# Patient Record
Sex: Male | Born: 1985
Health system: Southern US, Community
[De-identification: ages and names within clinical notes are randomized; demographics above are authoritative.]

## PROBLEM LIST (undated history)

## (undated) DIAGNOSIS — J45909 Unspecified asthma, uncomplicated: Secondary | ICD-10-CM

## (undated) DIAGNOSIS — J302 Other seasonal allergic rhinitis: Secondary | ICD-10-CM

## (undated) DIAGNOSIS — F419 Anxiety disorder, unspecified: Secondary | ICD-10-CM

## (undated) DIAGNOSIS — K219 Gastro-esophageal reflux disease without esophagitis: Secondary | ICD-10-CM

## (undated) DIAGNOSIS — R001 Bradycardia, unspecified: Secondary | ICD-10-CM

## (undated) HISTORY — PX: DENTAL SURGERY: SHX609

---
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
## 2007-03-15 ENCOUNTER — Emergency Department (HOSPITAL_COMMUNITY): Admission: EM | Admit: 2007-03-15 | Discharge: 2007-03-16 | Payer: Self-pay | Admitting: Emergency Medicine

## 2007-03-17 ENCOUNTER — Emergency Department (HOSPITAL_COMMUNITY): Admission: EM | Admit: 2007-03-17 | Discharge: 2007-03-18 | Payer: Self-pay | Admitting: Emergency Medicine

## 2007-03-19 ENCOUNTER — Emergency Department (HOSPITAL_COMMUNITY): Admission: EM | Admit: 2007-03-19 | Discharge: 2007-03-19 | Payer: Self-pay | Admitting: Emergency Medicine

## 2007-12-15 ENCOUNTER — Emergency Department (HOSPITAL_COMMUNITY): Admission: EM | Admit: 2007-12-15 | Discharge: 2007-12-16 | Payer: Self-pay | Admitting: Emergency Medicine

## 2008-02-09 ENCOUNTER — Emergency Department (HOSPITAL_COMMUNITY): Admission: EM | Admit: 2008-02-09 | Discharge: 2008-02-09 | Payer: Self-pay | Admitting: Emergency Medicine

## 2008-02-11 ENCOUNTER — Emergency Department (HOSPITAL_COMMUNITY): Admission: EM | Admit: 2008-02-11 | Discharge: 2008-02-11 | Payer: Self-pay | Admitting: Emergency Medicine

## 2008-02-28 ENCOUNTER — Emergency Department (HOSPITAL_COMMUNITY): Admission: EM | Admit: 2008-02-28 | Discharge: 2008-02-28 | Payer: Self-pay | Admitting: Emergency Medicine

## 2008-04-03 ENCOUNTER — Emergency Department (HOSPITAL_COMMUNITY): Admission: EM | Admit: 2008-04-03 | Discharge: 2008-04-03 | Payer: Self-pay | Admitting: Emergency Medicine

## 2008-04-05 ENCOUNTER — Emergency Department (HOSPITAL_COMMUNITY): Admission: EM | Admit: 2008-04-05 | Discharge: 2008-04-05 | Payer: Self-pay | Admitting: Emergency Medicine

## 2008-06-06 ENCOUNTER — Emergency Department (HOSPITAL_COMMUNITY): Admission: EM | Admit: 2008-06-06 | Discharge: 2008-06-06 | Payer: Self-pay | Admitting: Emergency Medicine

## 2008-06-09 ENCOUNTER — Emergency Department (HOSPITAL_COMMUNITY): Admission: EM | Admit: 2008-06-09 | Discharge: 2008-06-09 | Payer: Self-pay | Admitting: Emergency Medicine

## 2008-06-11 ENCOUNTER — Emergency Department (HOSPITAL_COMMUNITY): Admission: EM | Admit: 2008-06-11 | Discharge: 2008-06-12 | Payer: Self-pay | Admitting: Emergency Medicine

## 2008-06-11 ENCOUNTER — Emergency Department (HOSPITAL_COMMUNITY): Admission: EM | Admit: 2008-06-11 | Discharge: 2008-06-11 | Payer: Self-pay | Admitting: Emergency Medicine

## 2008-09-01 ENCOUNTER — Emergency Department (HOSPITAL_COMMUNITY): Admission: EM | Admit: 2008-09-01 | Discharge: 2008-09-02 | Payer: Self-pay | Admitting: Emergency Medicine

## 2009-03-30 ENCOUNTER — Emergency Department (HOSPITAL_COMMUNITY): Admission: EM | Admit: 2009-03-30 | Discharge: 2009-03-30 | Payer: Self-pay | Admitting: Emergency Medicine

## 2009-04-08 ENCOUNTER — Emergency Department (HOSPITAL_COMMUNITY): Admission: EM | Admit: 2009-04-08 | Discharge: 2009-04-08 | Payer: Self-pay | Admitting: Emergency Medicine

## 2009-06-26 ENCOUNTER — Emergency Department (HOSPITAL_COMMUNITY): Admission: EM | Admit: 2009-06-26 | Discharge: 2009-06-26 | Payer: Self-pay | Admitting: Emergency Medicine

## 2009-06-27 ENCOUNTER — Emergency Department (HOSPITAL_COMMUNITY): Admission: EM | Admit: 2009-06-27 | Discharge: 2009-06-27 | Payer: Self-pay | Admitting: Emergency Medicine

## 2009-08-14 ENCOUNTER — Emergency Department (HOSPITAL_COMMUNITY): Admission: EM | Admit: 2009-08-14 | Discharge: 2009-08-14 | Payer: Self-pay | Admitting: Emergency Medicine

## 2009-10-31 ENCOUNTER — Emergency Department (HOSPITAL_COMMUNITY): Admission: EM | Admit: 2009-10-31 | Discharge: 2009-10-31 | Payer: Self-pay | Admitting: Emergency Medicine

## 2010-09-11 ENCOUNTER — Emergency Department (HOSPITAL_COMMUNITY)
Admission: EM | Admit: 2010-09-11 | Discharge: 2010-09-12 | Discharge status: Against Medical Advice | Payer: Medicaid Other | Attending: Emergency Medicine | Admitting: Emergency Medicine

## 2010-09-11 DIAGNOSIS — R0602 Shortness of breath: Secondary | ICD-10-CM | POA: Insufficient documentation

## 2010-10-13 LAB — COMPREHENSIVE METABOLIC PANEL
Alkaline Phosphatase: 68 U/L (ref 39–117)
BUN: 11 mg/dL (ref 6–23)
Chloride: 104 mEq/L (ref 96–112)
GFR calc non Af Amer: >60 mL/min (ref >60)
Total Bilirubin: 0.8 mg/dL (ref 0.3–1.2)

## 2010-10-13 LAB — CBC
Hemoglobin: 16.8 g/dL (ref 13.0–17.0)
MCHC: 33.3 g/dL (ref 30.0–36.0)
MCV: 87.3 fL (ref 78.0–100.0)
Platelets: 186 10*3/uL (ref 150–400)
RDW: 14 % (ref 11.5–15.5)

## 2010-10-13 LAB — DIFFERENTIAL
Eosinophils Absolute: 0.1 10*3/uL (ref 0.0–0.7)
Eosinophils Relative: 2 % (ref 0–5)
Lymphs Abs: 1 10*3/uL (ref 0.7–4.0)
Monocytes Absolute: 0.5 10*3/uL (ref 0.1–1.0)
Neutro Abs: 3.5 10*3/uL (ref 1.7–7.7)
Neutrophils Relative %: 69 % (ref 43–77)

## 2010-10-13 LAB — LIPASE, BLOOD: Lipase: 17 U/L (ref 11–59)

## 2010-10-29 ENCOUNTER — Emergency Department (HOSPITAL_COMMUNITY)
Admission: EM | Admit: 2010-10-29 | Discharge: 2010-10-29 | Disposition: A | Discharge status: Home / Self Care | Payer: Medicaid Other | Attending: Emergency Medicine | Admitting: Emergency Medicine

## 2010-10-29 DIAGNOSIS — F411 Generalized anxiety disorder: Secondary | ICD-10-CM | POA: Insufficient documentation

## 2010-10-29 DIAGNOSIS — J45909 Unspecified asthma, uncomplicated: Secondary | ICD-10-CM | POA: Insufficient documentation

## 2010-10-29 DIAGNOSIS — F410 Panic disorder [episodic paroxysmal anxiety] without agoraphobia: Secondary | ICD-10-CM | POA: Insufficient documentation

## 2010-10-31 LAB — RAPID STREP SCREEN (MED CTR MEBANE ONLY): Streptococcus, Group A Screen (Direct): NEGATIVE

## 2010-11-01 ENCOUNTER — Emergency Department (HOSPITAL_COMMUNITY)
Admission: EM | Admit: 2010-11-01 | Discharge: 2010-11-01 | Disposition: A | Discharge status: Home / Self Care | Payer: Medicaid Other | Attending: Emergency Medicine | Admitting: Emergency Medicine

## 2010-11-01 DIAGNOSIS — J45909 Unspecified asthma, uncomplicated: Secondary | ICD-10-CM | POA: Insufficient documentation

## 2010-11-01 DIAGNOSIS — R112 Nausea with vomiting, unspecified: Secondary | ICD-10-CM | POA: Insufficient documentation

## 2010-11-01 DIAGNOSIS — R10819 Abdominal tenderness, unspecified site: Secondary | ICD-10-CM | POA: Insufficient documentation

## 2010-11-01 DIAGNOSIS — K2920 Alcoholic gastritis without bleeding: Secondary | ICD-10-CM | POA: Insufficient documentation

## 2010-11-01 DIAGNOSIS — Z79899 Other long term (current) drug therapy: Secondary | ICD-10-CM | POA: Insufficient documentation

## 2010-11-01 DIAGNOSIS — R109 Unspecified abdominal pain: Secondary | ICD-10-CM | POA: Insufficient documentation

## 2010-11-01 DIAGNOSIS — F17200 Nicotine dependence, unspecified, uncomplicated: Secondary | ICD-10-CM | POA: Insufficient documentation

## 2010-11-01 LAB — COMPREHENSIVE METABOLIC PANEL
Albumin: 4.4 g/dL (ref 3.5–5.2)
Alkaline Phosphatase: 62 U/L (ref 39–117)
BUN: 11 mg/dL (ref 6–23)
Creatinine, Ser: 0.96 mg/dL (ref 0.4–1.5)
Glucose, Bld: 89 mg/dL (ref 70–99)
Total Bilirubin: 0.5 mg/dL (ref 0.3–1.2)
Total Protein: 7 g/dL (ref 6.0–8.3)

## 2010-11-01 LAB — CBC
Hemoglobin: 15.5 g/dL (ref 13.0–17.0)
MCH: 29.1 pg (ref 26.0–34.0)
MCHC: 35.7 g/dL (ref 30.0–36.0)
RBC: 5.32 MIL/uL (ref 4.22–5.81)
RDW: 13.3 % (ref 11.5–15.5)

## 2010-11-01 LAB — DIFFERENTIAL
Eosinophils Relative: 5 % (ref 0–5)
Lymphocytes Relative: 37 % (ref 12–46)
Monocytes Absolute: 0.6 10*3/uL (ref 0.1–1.0)
Monocytes Relative: 8 % (ref 3–12)
Neutro Abs: 3.6 10*3/uL (ref 1.7–7.7)
Neutrophils Relative %: 50 % (ref 43–77)

## 2010-11-01 LAB — LIPASE, BLOOD: Lipase: 20 U/L (ref 11–59)

## 2010-11-01 LAB — OCCULT BLOOD, POC DEVICE: Fecal Occult Bld: NEGATIVE

## 2010-11-30 ENCOUNTER — Emergency Department (HOSPITAL_COMMUNITY)
Admission: EM | Admit: 2010-11-30 | Discharge: 2010-11-30 | Disposition: A | Discharge status: Home / Self Care | Payer: Medicaid Other | Attending: Emergency Medicine | Admitting: Emergency Medicine

## 2010-11-30 DIAGNOSIS — J45909 Unspecified asthma, uncomplicated: Secondary | ICD-10-CM | POA: Insufficient documentation

## 2010-11-30 DIAGNOSIS — R209 Unspecified disturbances of skin sensation: Secondary | ICD-10-CM | POA: Insufficient documentation

## 2010-11-30 DIAGNOSIS — L299 Pruritus, unspecified: Secondary | ICD-10-CM | POA: Insufficient documentation

## 2010-11-30 DIAGNOSIS — F17200 Nicotine dependence, unspecified, uncomplicated: Secondary | ICD-10-CM | POA: Insufficient documentation

## 2010-12-03 ENCOUNTER — Emergency Department (HOSPITAL_COMMUNITY)
Admission: EM | Admit: 2010-12-03 | Discharge: 2010-12-03 | Disposition: A | Discharge status: Home / Self Care | Payer: Medicaid Other | Attending: Emergency Medicine | Admitting: Emergency Medicine

## 2010-12-03 DIAGNOSIS — K089 Disorder of teeth and supporting structures, unspecified: Secondary | ICD-10-CM | POA: Insufficient documentation

## 2010-12-03 DIAGNOSIS — M2550 Pain in unspecified joint: Secondary | ICD-10-CM | POA: Insufficient documentation

## 2010-12-03 DIAGNOSIS — J45909 Unspecified asthma, uncomplicated: Secondary | ICD-10-CM | POA: Insufficient documentation

## 2010-12-12 NOTE — Consult Note (Signed)
NAME:  Kenneth Lane, Kenneth Lane NO.:  [id number]   MEDICAL RECORD NO.:  [id number]                   PATIENT TYPE:  INP   LOCATION:                                       FACILITY:  [facility]   PHYSICIAN:  Genene Churn. Love, M.D.                 DATE OF BIRTH:  1985-10-11   DATE OF CONSULTATION:  DATE OF DISCHARGE:                                   CONSULTATION   This 25 year old left-handed black male is seen on the teaching service for  evaluation of spells.   HISTORY OF PRESENT ILLNESS:  According to his mom, the patient was a 1 pound  7 ounce product of a 25-month gestation and was born at [facility]  15 years ago.  He walked and talked at about 1-1/2 years.  He was held back  in the first grade by his mother but since that time has done well in school  and has finished the 9th grade and is getting ready to go into the 10th  grade.  Yesterday he was staying at a friend's house overnight and he  complained of chest pain.  He had an episode of nausea and vomiting, was  given an icicle, laid down, become unresponsive.  He then had an episode of  unresponsiveness while the EMT service picked him up on the way to [facility] and then three others witnessed in the emergency room by Dr.  Ignacia Palma who treated the patient with IV Ativan.  This episode was  characterized by trembling movements without associated bowel or bladder  incontinence.  A CT scan of the brain has been normal but a CPK has been  elevated at 336.  There is a positive family history of seizures in the  patient's sister has a history of seizures.   PHYSICAL EXAMINATION:  GENERAL:  Well-developed, pleasant male in no acute  distress.  VITAL SIGNS:  Blood pressure right arm 100/60, left arm 100/60.  He was  afebrile.  HEART:  There is a right carotid and right supraclavicular bruit heard.  He  also had a heart murmur.  NEUROLOGIC:  He was alert and cooperative, would follow commands.   His  visual fields were full.  Disks were flat.  Extraocular movements were full  and corneals were present.  There was no 7th nerve palsy.  Tongue was  midline.  The uvula was midline and gags were present.  Sternocleidomastoid  and trapezius testing were normal.  Motor examination revealed good strength  in the upper and lower extremities.  Sensory examination was intact to pin  prick, light touch, __________ and vibration testing.  Deep tendon reflexes  were 2+ and plantar responses were downgoing.  Gait was not evaluated.   IMPRESSION:  Possible seizures (code 345.10).   PLAN:  Obtain an EEG and consider further evaluation after the above.   ADDENDUM:  CBC and comprehensive metabolic panel and drug screen to date  have been negative.                                               Genene Churn. Sandria Manly, M.D.    JML/MEDQ  D:  03/18/2004  T:  03/18/2004  Job:  [id number]

## 2010-12-12 NOTE — Discharge Summary (Signed)
NAME:  Kenneth Lane, Kenneth Lane NO.:  [id number]   MEDICAL RECORD NO.:  [id number]                   PATIENT TYPE:  INP   LOCATION:  6118                                 FACILITY:  [facility]   PHYSICIAN:  Asher Muir, M.D.                      DATE OF BIRTH:  12/25/1985   DATE OF ADMISSION:  03/17/2004  DATE OF DISCHARGE:  03/18/2004                                 DISCHARGE SUMMARY   REASON FOR HOSPITALIZATION:  Questionable seizures.   SIGNIFICANT FINDINGS:  [age]-year-old male admitted for a previous episode  of shaking, concern for seizure, true witnessed event in hospital during  which the patient could respond to questions and voluntarily move.  Neurological consult was done with Dr. Deanna Artis. Hickling and discharged  patient with a diagnosis of pseudoseizure.  Also, history of syncope with  prodromal dizziness.  EKGs performed were normal repeatedly.  The patient  was seen by cardiology and all results were within normal limits.  Patient  was treated Zantac for GERD-like symptoms.   OPERATIONS AND PROCEDURES:  1. Echocardiogram:  Normal.  2. EKG:  Normal.  3. Electroencephalogram:  Pending results.   FINAL DIAGNOSIS:  Pseudoseizure.   DISCHARGE MEDICATIONS AND INSTRUCTIONS:  Zantac 150 mg p.o. b.i.d.   PENDING RESULTS OR ISSUES TO BE FOLLOWED:  Electroencephalogram.   FOLLOWUP:  Follow up with Dr. Heather Roberts p.r.n.   DISCHARGE CONDITION:  Good.      Sharin Grave, MD                     Asher Muir, M.D.    AM/MEDQ  D:  03/18/2004  T:  03/19/2004  Job:  [id number]   cc:   Heather Roberts, M.D.   Deanna Artis. Sharene Skeans, M.D.  [address]  [address]  [place]  [place] [address]  Fax: [phone]   Elsie Stain, M.D.  [facility]  [address]  [place] [address]  Fax: [phone]

## 2010-12-12 NOTE — Procedures (Signed)
PROCEDURE:  Electroencephalogram.   PHYSICIAN:  Deanna Artis. Sharene Skeans, M.D.   INDICATIONS FOR PROCEDURE:  The patient is a nearly 25 year old young man  with a history of seizure-like behavior that began after he swallowed  something very hot, followed by something very cold.  He had a syncopal  episode and since that time has been having jerking movements that have been  interpreted as seizures, but appear on close evaluation to be pseudo-  seizures.  This study is being done to look for the presence of epilepsy.   DESCRIPTION OF PROCEDURE:  The tracing is carried out on a 32-channel  digital Cadwell recorder, reformatted into 16-channel montages, with one  devoted to an electrocardiogram.  The patient was awake and asleep during  the recording.  The International 10/20 System lead placement was used.  The  patient has received Ativan.   FINDINGS:  Dominant frequency is an 8-9 Hz 25-50 mV activity that is well-  modulated and regulated, and attenuates partially with eye opening.  The  background activity is a mixture of low-voltage alpha and beta range  activity with occasional upper theta range component superimposed, and also  occasional polymorphic delta range activity seen in the posterior regions,  which represents posterior slowing of youth.  The patient becomes drowsy and  drifts into natural sleep with the appearance of vertex sharp waves that  extend from the frontal vertex to the parietal vertex, and are later  associated with symmetric and synchronous sleep spindles.  The background is  a rhythmic and polymorphic delta range activity that is generalized.  There  was no focal slowing in the background.  There was no inter-ictal  epileptiform activity in the form of spikes or sharp waves.  Intermittent  photic stimulation was carried out and induces sustained driving response  from 3 Hz to 9 Hz.  Hyperventilation was carried out, with a return of the  waking rhythm and mild  slowing into the theta range.  There was no focal  slowing.  There was no inter-ictal epileptiform activity in the form of  spikes or sharp waves.  The electrocardiogram showed a sinus arrhythmia with a ventricular response  of 78 beats per minute.   ICD-9 CLASSIFICATION:  Normal record in the waking state and in natural  sleep.    WILLIAM H. Sharene Skeans, M.D.   ZOX:WRUE  D:  03/18/2004 19:04:29  T:  03/19/2004 11:22:14  Job #:  [id number]   cc:   Asher Muir, M.D.  [address]  [place] [address]  Fax: [phone]

## 2011-01-24 ENCOUNTER — Emergency Department (HOSPITAL_COMMUNITY)
Admission: EM | Admit: 2011-01-24 | Discharge: 2011-01-25 | Disposition: A | Discharge status: Home / Self Care | Payer: Medicaid Other | Attending: Emergency Medicine | Admitting: Emergency Medicine

## 2011-01-24 DIAGNOSIS — R197 Diarrhea, unspecified: Secondary | ICD-10-CM | POA: Insufficient documentation

## 2011-01-24 DIAGNOSIS — J45909 Unspecified asthma, uncomplicated: Secondary | ICD-10-CM | POA: Insufficient documentation

## 2011-01-24 DIAGNOSIS — R1013 Epigastric pain: Secondary | ICD-10-CM | POA: Insufficient documentation

## 2011-01-24 DIAGNOSIS — K2970 Gastritis, unspecified, without bleeding: Secondary | ICD-10-CM | POA: Insufficient documentation

## 2011-01-24 DIAGNOSIS — F17200 Nicotine dependence, unspecified, uncomplicated: Secondary | ICD-10-CM | POA: Insufficient documentation

## 2011-01-25 LAB — DIFFERENTIAL
Basophils Absolute: 0 10*3/uL (ref 0.0–0.1)
Basophils Relative: 1 % (ref 0–1)
Eosinophils Absolute: 0.3 10*3/uL (ref 0.0–0.7)
Monocytes Relative: 9 % (ref 3–12)
Neutro Abs: 2.2 10*3/uL (ref 1.7–7.7)
Neutrophils Relative %: 41 % — ABNORMAL LOW (ref 43–77)

## 2011-01-25 LAB — URINALYSIS, ROUTINE W REFLEX MICROSCOPIC
Hgb urine dipstick: NEGATIVE
Leukocytes, UA: NEGATIVE
Protein, ur: NEGATIVE mg/dL
Specific Gravity, Urine: 1.02 (ref 1.005–1.030)
Urobilinogen, UA: 1 mg/dL (ref 0.0–1.0)

## 2011-01-25 LAB — POCT I-STAT, CHEM 8
BUN: 12 mg/dL (ref 6–23)
Creatinine, Ser: 0.8 mg/dL (ref 0.50–1.35)
Hemoglobin: 16.7 g/dL (ref 13.0–17.0)
Potassium: 3.6 mEq/L (ref 3.5–5.1)
Sodium: 140 mEq/L (ref 135–145)
TCO2: 23 mmol/L (ref 0–100)

## 2011-01-25 LAB — HEPATIC FUNCTION PANEL
ALT: 8 U/L (ref 0–53)
AST: 14 U/L (ref 0–37)
Albumin: 4.6 g/dL (ref 3.5–5.2)
Alkaline Phosphatase: 72 U/L (ref 39–117)
Indirect Bilirubin: 0.2 mg/dL — ABNORMAL LOW (ref 0.3–0.9)
Total Protein: 7.3 g/dL (ref 6.0–8.3)

## 2011-01-25 LAB — CBC
Hemoglobin: 15.7 g/dL (ref 13.0–17.0)
Platelets: 202 10*3/uL (ref 150–400)
RBC: 5.35 MIL/uL (ref 4.22–5.81)
WBC: 5.5 10*3/uL (ref 4.0–10.5)

## 2011-01-26 ENCOUNTER — Emergency Department (HOSPITAL_COMMUNITY)
Admission: EM | Admit: 2011-01-26 | Discharge: 2011-01-27 | Disposition: A | Discharge status: Home / Self Care | Payer: Medicaid Other | Attending: Emergency Medicine | Admitting: Emergency Medicine

## 2011-01-26 DIAGNOSIS — Z91199 Patient's noncompliance with other medical treatment and regimen due to unspecified reason: Secondary | ICD-10-CM | POA: Insufficient documentation

## 2011-01-26 DIAGNOSIS — R10819 Abdominal tenderness, unspecified site: Secondary | ICD-10-CM | POA: Insufficient documentation

## 2011-01-26 DIAGNOSIS — F17200 Nicotine dependence, unspecified, uncomplicated: Secondary | ICD-10-CM | POA: Insufficient documentation

## 2011-01-26 DIAGNOSIS — R109 Unspecified abdominal pain: Secondary | ICD-10-CM | POA: Insufficient documentation

## 2011-01-26 DIAGNOSIS — R1110 Vomiting, unspecified: Secondary | ICD-10-CM | POA: Insufficient documentation

## 2011-01-26 DIAGNOSIS — K2970 Gastritis, unspecified, without bleeding: Secondary | ICD-10-CM | POA: Insufficient documentation

## 2011-01-26 DIAGNOSIS — J45909 Unspecified asthma, uncomplicated: Secondary | ICD-10-CM | POA: Insufficient documentation

## 2011-01-26 DIAGNOSIS — G8929 Other chronic pain: Secondary | ICD-10-CM | POA: Insufficient documentation

## 2011-01-26 DIAGNOSIS — Z9119 Patient's noncompliance with other medical treatment and regimen: Secondary | ICD-10-CM | POA: Insufficient documentation

## 2011-01-26 LAB — URINE CULTURE
Colony Count: NO GROWTH
Culture  Setup Time: 201207011149
Culture: NO GROWTH

## 2011-02-01 ENCOUNTER — Emergency Department (HOSPITAL_COMMUNITY)
Admission: EM | Admit: 2011-02-01 | Discharge: 2011-02-01 | Disposition: A | Discharge status: Home / Self Care | Payer: Medicaid Other | Attending: Emergency Medicine | Admitting: Emergency Medicine

## 2011-02-01 DIAGNOSIS — F411 Generalized anxiety disorder: Secondary | ICD-10-CM | POA: Insufficient documentation

## 2011-02-01 DIAGNOSIS — R0602 Shortness of breath: Secondary | ICD-10-CM | POA: Insufficient documentation

## 2011-02-01 DIAGNOSIS — Z8711 Personal history of peptic ulcer disease: Secondary | ICD-10-CM | POA: Insufficient documentation

## 2011-02-01 DIAGNOSIS — F17200 Nicotine dependence, unspecified, uncomplicated: Secondary | ICD-10-CM | POA: Insufficient documentation

## 2011-02-01 DIAGNOSIS — Z79899 Other long term (current) drug therapy: Secondary | ICD-10-CM | POA: Insufficient documentation

## 2011-02-01 DIAGNOSIS — R002 Palpitations: Secondary | ICD-10-CM | POA: Insufficient documentation

## 2011-02-01 DIAGNOSIS — R42 Dizziness and giddiness: Secondary | ICD-10-CM | POA: Insufficient documentation

## 2011-02-01 DIAGNOSIS — E876 Hypokalemia: Secondary | ICD-10-CM | POA: Insufficient documentation

## 2011-02-01 LAB — POCT I-STAT, CHEM 8
BUN: 13 mg/dL (ref 6–23)
Calcium, Ion: 1.16 mmol/L (ref 1.12–1.32)
Hemoglobin: 15 g/dL (ref 13.0–17.0)
Sodium: 140 mEq/L (ref 135–145)
TCO2: 22 mmol/L (ref 0–100)

## 2011-02-19 ENCOUNTER — Emergency Department (HOSPITAL_COMMUNITY)
Admission: EM | Admit: 2011-02-19 | Discharge: 2011-02-19 | Disposition: A | Discharge status: Home / Self Care | Payer: Medicaid Other | Attending: Emergency Medicine | Admitting: Emergency Medicine

## 2011-02-19 DIAGNOSIS — F411 Generalized anxiety disorder: Secondary | ICD-10-CM | POA: Insufficient documentation

## 2011-02-19 DIAGNOSIS — R109 Unspecified abdominal pain: Secondary | ICD-10-CM | POA: Insufficient documentation

## 2011-02-19 DIAGNOSIS — J45909 Unspecified asthma, uncomplicated: Secondary | ICD-10-CM | POA: Insufficient documentation

## 2011-02-19 DIAGNOSIS — G8929 Other chronic pain: Secondary | ICD-10-CM | POA: Insufficient documentation

## 2011-02-19 DIAGNOSIS — F17200 Nicotine dependence, unspecified, uncomplicated: Secondary | ICD-10-CM | POA: Insufficient documentation

## 2011-02-19 LAB — CBC
MCH: 28.2 pg (ref 26.0–34.0)
MCV: 82.1 fL (ref 78.0–100.0)
Platelets: 184 10*3/uL (ref 150–400)
RDW: 13.4 % (ref 11.5–15.5)

## 2011-02-19 LAB — DIFFERENTIAL
Basophils Relative: 1 % (ref 0–1)
Eosinophils Absolute: 0.3 10*3/uL (ref 0.0–0.7)
Eosinophils Relative: 7 % — ABNORMAL HIGH (ref 0–5)
Lymphs Abs: 1.5 10*3/uL (ref 0.7–4.0)
Monocytes Relative: 10 % (ref 3–12)

## 2011-02-19 LAB — COMPREHENSIVE METABOLIC PANEL
AST: 17 U/L (ref 0–37)
Albumin: 4.2 g/dL (ref 3.5–5.2)
Calcium: 9.8 mg/dL (ref 8.4–10.5)
Creatinine, Ser: 0.77 mg/dL (ref 0.50–1.35)
Total Protein: 7.1 g/dL (ref 6.0–8.3)

## 2011-02-19 LAB — URINALYSIS, ROUTINE W REFLEX MICROSCOPIC
Glucose, UA: NEGATIVE mg/dL
Hgb urine dipstick: NEGATIVE
Ketones, ur: NEGATIVE mg/dL
Leukocytes, UA: NEGATIVE
Protein, ur: NEGATIVE mg/dL
Urobilinogen, UA: 0.2 mg/dL (ref 0.0–1.0)

## 2011-02-28 ENCOUNTER — Emergency Department (HOSPITAL_COMMUNITY)
Admission: EM | Admit: 2011-02-28 | Discharge: 2011-02-28 | Disposition: A | Discharge status: Home / Self Care | Payer: Medicaid Other | Attending: Emergency Medicine | Admitting: Emergency Medicine

## 2011-02-28 ENCOUNTER — Emergency Department (HOSPITAL_COMMUNITY): Payer: Medicaid Other

## 2011-02-28 DIAGNOSIS — F29 Unspecified psychosis not due to a substance or known physiological condition: Secondary | ICD-10-CM | POA: Insufficient documentation

## 2011-02-28 DIAGNOSIS — R0609 Other forms of dyspnea: Secondary | ICD-10-CM | POA: Insufficient documentation

## 2011-02-28 DIAGNOSIS — R0989 Other specified symptoms and signs involving the circulatory and respiratory systems: Secondary | ICD-10-CM | POA: Insufficient documentation

## 2011-02-28 LAB — CBC
Hemoglobin: 14.9 g/dL (ref 13.0–17.0)
MCH: 29.4 pg (ref 26.0–34.0)
MCV: 81.4 fL (ref 78.0–100.0)
RBC: 5.06 MIL/uL (ref 4.22–5.81)

## 2011-02-28 LAB — ETHANOL: Alcohol, Ethyl (B): <11 mg/dL (ref 0–11)

## 2011-02-28 LAB — DIFFERENTIAL
Basophils Relative: 1 % (ref 0–1)
Lymphocytes Relative: 37 % (ref 12–46)
Lymphs Abs: 2.3 10*3/uL (ref 0.7–4.0)
Monocytes Relative: 8 % (ref 3–12)
Neutro Abs: 3.2 10*3/uL (ref 1.7–7.7)
Neutrophils Relative %: 51 % (ref 43–77)

## 2011-02-28 LAB — BASIC METABOLIC PANEL
CO2: 26 mEq/L (ref 19–32)
Chloride: 103 mEq/L (ref 96–112)
Creatinine, Ser: 0.83 mg/dL (ref 0.50–1.35)

## 2011-03-06 ENCOUNTER — Emergency Department (HOSPITAL_COMMUNITY)
Admission: EM | Admit: 2011-03-06 | Discharge: 2011-03-06 | Disposition: A | Discharge status: Home / Self Care | Payer: Medicaid Other | Attending: Emergency Medicine | Admitting: Emergency Medicine

## 2011-03-06 DIAGNOSIS — L2989 Other pruritus: Secondary | ICD-10-CM | POA: Insufficient documentation

## 2011-03-06 DIAGNOSIS — R21 Rash and other nonspecific skin eruption: Secondary | ICD-10-CM | POA: Insufficient documentation

## 2011-03-06 DIAGNOSIS — L298 Other pruritus: Secondary | ICD-10-CM | POA: Insufficient documentation

## 2011-04-16 ENCOUNTER — Emergency Department (HOSPITAL_COMMUNITY)
Admission: EM | Admit: 2011-04-16 | Discharge: 2011-04-17 | Disposition: A | Discharge status: Home / Self Care | Payer: Medicaid Other | Attending: Emergency Medicine | Admitting: Emergency Medicine

## 2011-04-16 ENCOUNTER — Emergency Department (HOSPITAL_COMMUNITY): Payer: Medicaid Other

## 2011-04-16 DIAGNOSIS — F411 Generalized anxiety disorder: Secondary | ICD-10-CM | POA: Insufficient documentation

## 2011-04-16 DIAGNOSIS — F329 Major depressive disorder, single episode, unspecified: Secondary | ICD-10-CM | POA: Insufficient documentation

## 2011-04-16 DIAGNOSIS — R059 Cough, unspecified: Secondary | ICD-10-CM | POA: Insufficient documentation

## 2011-04-16 DIAGNOSIS — R072 Precordial pain: Secondary | ICD-10-CM | POA: Insufficient documentation

## 2011-04-16 DIAGNOSIS — F3289 Other specified depressive episodes: Secondary | ICD-10-CM | POA: Insufficient documentation

## 2011-04-16 DIAGNOSIS — R05 Cough: Secondary | ICD-10-CM | POA: Insufficient documentation

## 2011-04-16 DIAGNOSIS — R10816 Epigastric abdominal tenderness: Secondary | ICD-10-CM | POA: Insufficient documentation

## 2011-04-16 DIAGNOSIS — K2970 Gastritis, unspecified, without bleeding: Secondary | ICD-10-CM | POA: Insufficient documentation

## 2011-04-16 DIAGNOSIS — R112 Nausea with vomiting, unspecified: Secondary | ICD-10-CM | POA: Insufficient documentation

## 2011-04-17 LAB — OCCULT BLOOD, POC DEVICE: Fecal Occult Bld: NEGATIVE

## 2011-04-17 LAB — POCT I-STAT, CHEM 8
BUN: 12 mg/dL (ref 6–23)
Creatinine, Ser: 1 mg/dL (ref 0.50–1.35)
Sodium: 141 mEq/L (ref 135–145)
TCO2: 25 mmol/L (ref 0–100)

## 2011-04-20 ENCOUNTER — Emergency Department (HOSPITAL_COMMUNITY)
Admission: EM | Admit: 2011-04-20 | Discharge: 2011-04-21 | Disposition: A | Discharge status: Home / Self Care | Payer: Medicaid Other | Attending: Emergency Medicine | Admitting: Emergency Medicine

## 2011-04-20 DIAGNOSIS — Z79899 Other long term (current) drug therapy: Secondary | ICD-10-CM | POA: Insufficient documentation

## 2011-04-20 DIAGNOSIS — R002 Palpitations: Secondary | ICD-10-CM | POA: Insufficient documentation

## 2011-04-24 LAB — URINALYSIS, ROUTINE W REFLEX MICROSCOPIC
Glucose, UA: NEGATIVE
Protein, ur: NEGATIVE
Specific Gravity, Urine: 1.011
Urobilinogen, UA: 0.2

## 2011-04-24 LAB — HEPATIC FUNCTION PANEL
ALT: 13
AST: 21
Albumin: 4.5
Alkaline Phosphatase: 95
Total Protein: 6.9

## 2011-04-24 LAB — DIFFERENTIAL
Eosinophils Absolute: 0.4
Lymphocytes Relative: 46
Lymphs Abs: 2.5
Monocytes Relative: 9
Neutro Abs: 2
Neutrophils Relative %: 38 — ABNORMAL LOW

## 2011-04-24 LAB — CBC
Platelets: 230
RBC: 5.64
WBC: 5.4

## 2011-04-24 LAB — COMPREHENSIVE METABOLIC PANEL
AST: 23
Albumin: 4.5
Calcium: 9.9
Creatinine, Ser: 0.89
GFR calc Af Amer: >60

## 2011-04-24 LAB — SAMPLE TO BLOOD BANK

## 2011-04-24 LAB — POCT I-STAT, CHEM 8
BUN: 10
Calcium, Ion: 1.23
Chloride: 104
Creatinine, Ser: 1.3

## 2011-04-24 LAB — LIPASE, BLOOD: Lipase: 17

## 2011-04-29 LAB — CBC
HCT: 44.6
Hemoglobin: 14.6
MCV: 87.6
RBC: 5.09
WBC: 8.3

## 2011-04-29 LAB — DIFFERENTIAL
Eosinophils Absolute: 0.1
Eosinophils Relative: 1
Lymphs Abs: 1.8
Monocytes Absolute: 0.8
Monocytes Relative: 9

## 2011-05-08 LAB — DIFFERENTIAL
Basophils Absolute: 0
Basophils Relative: 0
Basophils Relative: 1
Eosinophils Absolute: 0.6
Eosinophils Relative: 4
Monocytes Absolute: 0.5
Monocytes Relative: 7
Neutrophils Relative %: 35 — ABNORMAL LOW

## 2011-05-08 LAB — URINALYSIS, ROUTINE W REFLEX MICROSCOPIC
Hgb urine dipstick: NEGATIVE
Ketones, ur: NEGATIVE
Leukocytes, UA: NEGATIVE
Nitrite: NEGATIVE
Nitrite: NEGATIVE
Protein, ur: NEGATIVE
Specific Gravity, Urine: 1.019
Urobilinogen, UA: 1
pH: 7.5

## 2011-05-08 LAB — CBC
MCHC: 34.4
MCV: 84.5
Platelets: 219
Platelets: 229
RDW: 13.1
WBC: 6.8

## 2011-05-08 LAB — I-STAT 8, (EC8 V) (CONVERTED LAB)
BUN: 9
Bicarbonate: 26.1 — ABNORMAL HIGH
Glucose, Bld: 108 — ABNORMAL HIGH
Sodium: 139
pCO2, Ven: 47.2

## 2011-05-08 LAB — URINE MICROSCOPIC-ADD ON

## 2011-05-08 LAB — HEPATIC FUNCTION PANEL
ALT: 14
Indirect Bilirubin: 0.6
Total Protein: 7.6

## 2011-05-08 LAB — COMPREHENSIVE METABOLIC PANEL
AST: 27
Albumin: 4.5
Alkaline Phosphatase: 98
Chloride: 100
GFR calc Af Amer: >60
Potassium: 4.7
Sodium: 134 — ABNORMAL LOW
Total Bilirubin: 1.1

## 2011-05-08 LAB — BASIC METABOLIC PANEL
BUN: 12
CO2: 30
Chloride: 102
Creatinine, Ser: 0.98
Glucose, Bld: 104 — ABNORMAL HIGH

## 2011-05-08 LAB — AMYLASE: Amylase: 131

## 2011-05-08 LAB — ETHANOL: Alcohol, Ethyl (B): <5

## 2011-05-08 LAB — LIPASE, BLOOD: Lipase: 12

## 2011-05-27 ENCOUNTER — Emergency Department (HOSPITAL_COMMUNITY)
Admission: EM | Admit: 2011-05-27 | Discharge: 2011-05-27 | Disposition: A | Discharge status: Home / Self Care | Payer: Medicaid Other | Attending: Emergency Medicine | Admitting: Emergency Medicine

## 2011-05-27 ENCOUNTER — Emergency Department (HOSPITAL_COMMUNITY): Payer: Medicaid Other

## 2011-05-27 DIAGNOSIS — R042 Hemoptysis: Secondary | ICD-10-CM | POA: Insufficient documentation

## 2011-05-27 DIAGNOSIS — K259 Gastric ulcer, unspecified as acute or chronic, without hemorrhage or perforation: Secondary | ICD-10-CM | POA: Insufficient documentation

## 2011-05-27 LAB — POCT I-STAT, CHEM 8
Hemoglobin: 17.7 g/dL — ABNORMAL HIGH (ref 13.0–17.0)
Sodium: 139 mEq/L (ref 135–145)
TCO2: 26 mmol/L (ref 0–100)

## 2011-05-27 LAB — OCCULT BLOOD, POC DEVICE: Fecal Occult Bld: NEGATIVE

## 2011-06-07 DIAGNOSIS — M25579 Pain in unspecified ankle and joints of unspecified foot: Secondary | ICD-10-CM | POA: Insufficient documentation

## 2011-06-07 DIAGNOSIS — K2990 Gastroduodenitis, unspecified, without bleeding: Secondary | ICD-10-CM | POA: Insufficient documentation

## 2011-06-07 DIAGNOSIS — R1013 Epigastric pain: Secondary | ICD-10-CM | POA: Insufficient documentation

## 2011-06-07 DIAGNOSIS — R110 Nausea: Secondary | ICD-10-CM | POA: Insufficient documentation

## 2011-06-07 DIAGNOSIS — K2970 Gastritis, unspecified, without bleeding: Secondary | ICD-10-CM | POA: Insufficient documentation

## 2011-06-07 DIAGNOSIS — Z79899 Other long term (current) drug therapy: Secondary | ICD-10-CM | POA: Insufficient documentation

## 2011-06-08 ENCOUNTER — Emergency Department (HOSPITAL_COMMUNITY)
Admission: EM | Admit: 2011-06-08 | Discharge: 2011-06-08 | Disposition: A | Discharge status: Home / Self Care | Payer: Medicaid Other | Attending: Emergency Medicine | Admitting: Emergency Medicine

## 2011-06-08 ENCOUNTER — Emergency Department (HOSPITAL_COMMUNITY)
Admission: EM | Admit: 2011-06-08 | Discharge: 2011-06-08 | Disposition: A | Discharge status: Home / Self Care | Payer: Medicaid Other | Source: Home / Self Care | Attending: Emergency Medicine | Admitting: Emergency Medicine

## 2011-06-08 ENCOUNTER — Emergency Department (HOSPITAL_COMMUNITY): Payer: Medicaid Other

## 2011-06-08 ENCOUNTER — Encounter (HOSPITAL_COMMUNITY): Payer: Self-pay | Admitting: *Deleted

## 2011-06-08 ENCOUNTER — Encounter: Payer: Self-pay | Admitting: *Deleted

## 2011-06-08 DIAGNOSIS — R112 Nausea with vomiting, unspecified: Secondary | ICD-10-CM | POA: Insufficient documentation

## 2011-06-08 DIAGNOSIS — R142 Eructation: Secondary | ICD-10-CM | POA: Insufficient documentation

## 2011-06-08 DIAGNOSIS — R109 Unspecified abdominal pain: Secondary | ICD-10-CM | POA: Insufficient documentation

## 2011-06-08 DIAGNOSIS — R141 Gas pain: Secondary | ICD-10-CM | POA: Insufficient documentation

## 2011-06-08 DIAGNOSIS — K2970 Gastritis, unspecified, without bleeding: Secondary | ICD-10-CM

## 2011-06-08 DIAGNOSIS — F17200 Nicotine dependence, unspecified, uncomplicated: Secondary | ICD-10-CM | POA: Insufficient documentation

## 2011-06-08 HISTORY — DX: Other seasonal allergic rhinitis: J30.2

## 2011-06-08 LAB — CBC
MCHC: 34.6 g/dL (ref 30.0–36.0)
MCV: 83.5 fL (ref 78.0–100.0)
Platelets: 232 10*3/uL (ref 150–400)
RDW: 13.5 % (ref 11.5–15.5)
WBC: 6.5 10*3/uL (ref 4.0–10.5)

## 2011-06-08 LAB — COMPREHENSIVE METABOLIC PANEL
ALT: 20 U/L (ref 0–53)
AST: 22 U/L (ref 0–37)
Albumin: 4.9 g/dL (ref 3.5–5.2)
CO2: 24 mEq/L (ref 19–32)
Calcium: 9.9 mg/dL (ref 8.4–10.5)
GFR calc non Af Amer: >90 mL/min (ref >90)
Sodium: 140 mEq/L (ref 135–145)
Total Protein: 7.6 g/dL (ref 6.0–8.3)

## 2011-06-08 LAB — DIFFERENTIAL
Basophils Absolute: 0 10*3/uL (ref 0.0–0.1)
Basophils Relative: 1 % (ref 0–1)
Eosinophils Relative: 0 % (ref 0–5)
Lymphocytes Relative: 26 % (ref 12–46)
Neutro Abs: 4.3 10*3/uL (ref 1.7–7.7)

## 2011-06-08 LAB — LIPASE, BLOOD: Lipase: 12 U/L (ref 11–59)

## 2011-06-08 MED ORDER — FAMOTIDINE 20 MG PO TABS: 20.0000 mg | ORAL_TABLET | Freq: Two times a day (BID) | ORAL | Status: DC

## 2011-06-08 MED ORDER — ONDANSETRON 8 MG PO TBDP
8.0000 mg | ORAL_TABLET | Freq: Once | ORAL | Status: AC
  Administered 2011-06-08: 8 mg via ORAL
  Filled 2011-06-08: qty 1

## 2011-06-08 MED ORDER — GI COCKTAIL ~~LOC~~
30.0000 mL | Freq: Once | ORAL | Status: AC
Start: 2011-06-08 — End: 2011-06-08
  Administered 2011-06-08: 30 mL via ORAL
  Filled 2011-06-08: qty 30

## 2011-06-08 MED ORDER — SUCRALFATE 1 G PO TABS: 1.0000 g | ORAL_TABLET | Freq: Four times a day (QID) | ORAL | Status: DC

## 2011-06-08 MED ORDER — ONDANSETRON HCL 4 MG PO TABS: 4.0000 mg | ORAL_TABLET | Freq: Four times a day (QID) | ORAL | Status: AC

## 2011-06-08 NOTE — ED Provider Notes (Addendum)
History     CSN: [id number] Arrival date & time: 06/08/2011  3:11 PM   First MD Initiated Contact with Patient 06/08/11 1630      Chief Complaint  Patient presents with  . Emesis  . Abdominal Pain    (Consider location/radiation/quality/duration/timing/severity/associated sxs/prior treatment) HPI Pt presents with abdominal pain and vomiting blood since yesterday.   He had bright red specks.  Pt has history of gastritis and was seen by Dr Evette Cristal.  Pt was told by his doctor to come to the ED if the symptoms returned.  He has had some sharp pain in his upper abdomen as well.  He had the endoscopy on Nov 7th. Past Medical History  Diagnosis Date  . Seasonal allergies     Past Surgical History  Procedure Date  . Dental surgery     No family history on file.  History  Substance Use Topics  . Smoking status: Current Everyday Smoker -- 1.0 packs/day    Types: Cigarettes  . Smokeless tobacco: Not on file  . Alcohol Use: No      Review of Systems  All other systems reviewed and are negative.    Allergies  Shellfish-derived products  Home Medications   Current Outpatient Rx  Name Route Sig Dispense Refill  . OMEPRAZOLE 20 MG PO CPDR Oral Take 20 mg by mouth daily as needed. For upset stomach     . SUCRALFATE 1 G PO TABS Oral Take 1 tablet (1 g total) by mouth 4 (four) times daily. 60 tablet 1    BP 139/83  Pulse 65  Temp(Src) 98.4 F (36.9 C) (Oral)  Resp 19  SpO2 100%  Physical Exam  Nursing note and vitals reviewed. Constitutional: He appears well-developed and well-nourished. No distress.  HENT:  Head: Normocephalic and atraumatic.  Right Ear: External ear normal.  Left Ear: External ear normal.  Eyes: Conjunctivae are normal. Right eye exhibits no discharge. Left eye exhibits no discharge. No scleral icterus.  Neck: Neck supple. No tracheal deviation present.  Cardiovascular: Normal rate, regular rhythm and intact distal pulses.   Pulmonary/Chest:  Effort normal and breath sounds normal. No stridor. No respiratory distress. He has no wheezes. He has no rales.  Abdominal: Soft. Bowel sounds are normal. He exhibits no distension. There is tenderness (epigastrim). There is no rebound and no guarding.  Musculoskeletal: He exhibits no edema and no tenderness.  Neurological: He is alert. He has normal strength. No sensory deficit. Cranial nerve deficit:  no gross defecits noted. He exhibits normal muscle tone. He displays no seizure activity. Coordination normal.  Skin: Skin is warm and dry. No rash noted.  Psychiatric: He has a normal mood and affect.    ED Course  Procedures (including critical care time)  Labs Reviewed - No data to display Dg Abd Acute W/chest  06/08/2011  *RADIOLOGY REPORT*  Clinical Data: Upper to mid abdominal pain and distension.  ACUTE ABDOMEN SERIES (ABDOMEN 2 VIEW & CHEST 1 VIEW)  Comparison: Chest radiograph performed 05/27/2011, and abdominal radiograph 03/17/2007  Findings: The lungs are well-aerated.  Chronic peribronchial thickening is noted.  There is no evidence of focal opacification, pleural effusion or pneumothorax.  The cardiomediastinal silhouette is within normal limits.  The visualized bowel gas pattern is unremarkable.  Scattered air and stool are noted within the colon; there is no evidence of small bowel dilatation to suggest obstruction.  No free intra-abdominal air is identified on the provided upright view.  No acute osseous abnormalities  are seen; the sacroiliac joints are unremarkable in appearance.  IMPRESSION:  1.  Unremarkable bowel gas pattern; no free intra-abdominal air seen. 2.  Chronic peribronchial thickening; lungs otherwise clear.  Original Report Authenticated By: Tonia Ghent, M.D.      MDM   The patient has had multiple recurrent episodes to [facility] system for recurrent episodes of abdominal pain. Patient just had an endoscopy recently he is currently taking medications  for that. Patient had lipase yesterday it was normal his labs today are again normal. At this point there does not appear to be any evidence of an acute emergency medical condition. Patient can safely followup with his GI doctor.  Diagnosis : #1 abdominal pain #2 vomiting       Celene Kras, MD 06/08/11 1806  Celene Kras, MD 08/11/11 [phone]

## 2011-06-08 NOTE — ED Provider Notes (Signed)
History     CSN: [id number] Arrival date & time: 06/08/2011 12:10 AM   First MD Initiated Contact with Patient 06/08/11 0148      Chief Complaint  Patient presents with  . Abdominal Pain  . Ankle Pain    (Consider location/radiation/quality/duration/t sxs/prior treatment) HPI Comments: Pt has recently been dx with gastritis by endoscopy by Dr. Evette Cristal - states he has had long time sx and has been treated with Prilosec which sometimes helps - this pain is mid and epigastric and has no radiation, is crampy - relieved with flatus and not associated with fevers, swelling, cough, sob, cp, back pain, diarreha or constipation or urinary sx.  Patient is a 25 y.o. male presenting with abdominal pain and ankle pain. The history is provided by the patient and a friend.  Abdominal Pain The primary symptoms of the illness include abdominal pain and nausea. The primary symptoms of the illness do not include fever, fatigue, shortness of breath, vomiting, diarrhea, hematemesis, hematochezia or dysuria. Episode onset: several years ago. The onset of the illness was gradual. The problem has been gradually worsening.  Associated with: gastritis as dx on endoscopy 5 days ago =- no PUD per pt. The patient has not had a change in bowel habit. Symptoms associated with the illness do not include chills, anorexia, diaphoresis, constipation, urgency, hematuria, frequency or back pain.  Ankle Pain     Past Medical History  Diagnosis Date  . Seasonal allergies     Past Surgical History  Procedure Date  . Dental surgery     No family history on file.  History  Substance Use Topics  . Smoking status: Current Everyday Smoker -- 1.0 packs/day    Types: Cigarettes  . Smokeless tobacco: Not on file  . Alcohol Use: No      Review of Systems  Constitutional: Negative for fever, chills, diaphoresis and fatigue.  Respiratory: Negative for shortness of breath.   Gastrointestinal: Positive for nausea and  abdominal pain. Negative for vomiting, diarrhea, constipation, hematochezia, anorexia and hematemesis.  Genitourinary: Negative for dysuria, urgency, frequency and hematuria.  Musculoskeletal: Negative for back pain.  All other systems reviewed and are negative.    Allergies  Review of patient's allergies indicates no known allergies.  Home Medications   Current Outpatient Rx  Name Route Sig Dispense Refill  . OMEPRAZOLE 20 MG PO CPDR Oral Take 20 mg by mouth daily as needed. For upset stomach     . FAMOTIDINE 20 MG PO TABS Oral Take 1 tablet (20 mg total) by mouth 2 (two) times daily. 30 tablet 0  . SUCRALFATE 1 G PO TABS Oral Take 1 tablet (1 g total) by mouth 4 (four) times daily. 60 tablet 1    BP 102/76  Pulse 55  Temp(Src) 97.2 F (36.2 C) (Oral)  Resp 18  SpO2 100%  Physical Exam  Nursing note and vitals reviewed. Constitutional: He appears well-developed and well-nourished. No distress.  HENT:  Head: Normocephalic and atraumatic.  Mouth/Throat: Oropharynx is clear and moist. No oropharyngeal exudate.  Eyes: Conjunctivae and EOM are normal. Pupils are equal, round, and reactive to light. Right eye exhibits no discharge. Left eye exhibits no discharge. No scleral icterus.  Neck: Normal range of motion. Neck supple. No JVD present. No thyromegaly present.  Cardiovascular: Normal rate, regular rhythm, normal heart sounds and intact distal pulses.  Exam reveals no gallop and no friction rub.   No murmur heard. Pulmonary/Chest: Effort normal and breath sounds normal.  No respiratory distress. He has no wheezes. He has no rales.  Abdominal: Soft. Bowel sounds are normal. He exhibits no distension and no mass. There is tenderness ( ttp in the epigastrium and the periumbilical area.  no guarding, no lower abd ttp).       No CVA ttp  Musculoskeletal: Normal range of motion. He exhibits no edema and no tenderness.  Lymphadenopathy:    He has no cervical adenopathy.    Neurological: He is alert. Coordination normal.  Skin: Skin is warm and dry. No rash noted. No erythema.  Psychiatric: He has a normal mood and affect. His behavior is normal.    ED Course  Procedures (including critical care time)   Labs Reviewed  LIPASE, BLOOD   Dg Abd Acute W/chest  06/08/2011  *RADIOLOGY REPORT*  Clinical Data: Upper to mid abdominal pain and distension.  ACUTE ABDOMEN SERIES (ABDOMEN 2 VIEW & CHEST 1 VIEW)  Comparison: Chest radiograph performed 05/27/2011, and abdominal radiograph 03/17/2007  Findings: The lungs are well-aerated.  Chronic peribronchial thickening is noted.  There is no evidence of focal opacification, pleural effusion or pneumothorax.  The cardiomediastinal silhouette is within normal limits.  The visualized bowel gas pattern is unremarkable.  Scattered air and stool are noted within the colon; there is no evidence of small bowel dilatation to suggest obstruction.  No free intra-abdominal air is identified on the provided upright view.  No acute osseous abnormalities are seen; the sacroiliac joints are unremarkable in appearance.  IMPRESSION:  1.  Unremarkable bowel gas pattern; no free intra-abdominal air seen. 2.  Chronic peribronchial thickening; lungs otherwise clear.  Original Report Authenticated By: Tonia Ghent, M.D.     1. Gastritis       MDM  Well appearing, no PO since 18 hours, likely gastirtic - r/o pancreatitis, but no RUQ ttp and no lower abd ttp, GI cocktail, AAS      Lipase normal, imaging neg, improved with GI cocktail - will d/c home.  Referred back to GI / PMD  Start H2 and carafate in addition to PPI  Vida Roller, MD 06/08/11 [phone]

## 2011-06-08 NOTE — ED Notes (Signed)
Pt states "seen @ [facility] 11/7, seen @ [facility] yesterday, I was told if I keep vomiting blood, come to the hospital, vomited last @ 1000, had specks of bright red blood"

## 2011-06-08 NOTE — ED Notes (Signed)
rx x 1, pt voiced understanding to f/u with PCP and return for worsening of sx.

## 2011-06-08 NOTE — ED Notes (Signed)
Pt c/o abdominal pain since 9 pm tonight.  States had endoscopy on 06-03-11, states they saw no bleeding ulcers.  Endoscopy done at [facility].  Hx of ongoing abdominal pain for 3-4 years.n  EDP at bedside to assess pt.

## 2011-10-20 ENCOUNTER — Emergency Department (HOSPITAL_COMMUNITY)
Admission: EM | Admit: 2011-10-20 | Discharge: 2011-10-20 | Disposition: A | Discharge status: Home / Self Care | Payer: Medicaid Other | Attending: Emergency Medicine | Admitting: Emergency Medicine

## 2011-10-20 ENCOUNTER — Emergency Department (HOSPITAL_COMMUNITY): Payer: Medicaid Other

## 2011-10-20 ENCOUNTER — Encounter (HOSPITAL_COMMUNITY): Payer: Self-pay | Admitting: *Deleted

## 2011-10-20 DIAGNOSIS — R079 Chest pain, unspecified: Secondary | ICD-10-CM | POA: Insufficient documentation

## 2011-10-20 DIAGNOSIS — F17200 Nicotine dependence, unspecified, uncomplicated: Secondary | ICD-10-CM | POA: Insufficient documentation

## 2011-10-20 NOTE — ED Provider Notes (Signed)
History     CSN: [id number]  Arrival date & time 10/20/11  1815   First MD Initiated Contact with Patient 10/20/11 1905      Chief Complaint  Patient presents with  . Chest Pain  . Sore Throat    (Consider location/radiation/quality/duration/timing/severity/associated sxs/prior treatment) HPI Comments: Patient with chest pain over the last week it is intermittent in nature.  It is worsened by smoking.  No shortness of breath.  Patient has mild cough but no fevers.  It is not associated with food he has no nausea or vomiting.  Patient is a 26 y.o. male presenting with chest pain and pharyngitis. The history is provided by the patient. No language interpreter was used.  Chest Pain The chest pain began 5 - 7 days ago. Chest pain occurs intermittently. The chest pain is unchanged. Associated with: smoking. The severity of the pain is mild. The quality of the pain is described as aching. The pain radiates to the upper back. Chest pain is worsened by smoking. Primary symptoms include cough. Pertinent negatives for primary symptoms include no fever, no fatigue, no syncope, no shortness of breath, no wheezing, no palpitations, no abdominal pain, no nausea, no vomiting, no dizziness and no altered mental status.    Sore Throat Associated symptoms include chest pain. Pertinent negatives include no abdominal pain, no headaches and no shortness of breath.    Past Medical History  Diagnosis Date  . Seasonal allergies     Past Surgical History  Procedure Date  . Dental surgery     History reviewed. No pertinent family history.  History  Substance Use Topics  . Smoking status: Current Everyday Smoker -- 1.0 packs/day    Types: Cigarettes  . Smokeless tobacco: Not on file  . Alcohol Use: No     stopped x 3 weeks      Review of Systems  Constitutional: Negative.  Negative for fever, chills and fatigue.  HENT: Negative.   Eyes: Negative.  Negative for discharge and redness.    Respiratory: Positive for cough. Negative for shortness of breath and wheezing.   Cardiovascular: Positive for chest pain. Negative for palpitations and syncope.  Gastrointestinal: Negative.  Negative for nausea, vomiting and abdominal pain.  Genitourinary: Negative.  Negative for hematuria.  Musculoskeletal: Negative.  Negative for back pain.  Skin: Negative.  Negative for color change and rash.  Neurological: Negative for dizziness, syncope and headaches.  Hematological: Negative.  Negative for adenopathy.  Psychiatric/Behavioral: Negative.  Negative for confusion and altered mental status.  All other systems reviewed and are negative.    Allergies  Shellfish-derived products  Home Medications  No current outpatient prescriptions on file.  BP 131/73  Pulse 65  Temp(Src) 98.1 F (36.7 C) (Oral)  Resp 16  Ht 5\' 5"  (1.651 m)  Wt 124 lb (56.246 kg)  BMI 20.63 kg/m2  SpO2 99%  Physical Exam  Nursing note and vitals reviewed. Constitutional: He is oriented to person, place, and time. He appears well-developed and well-nourished.  Non-toxic appearance. He does not have a sickly appearance.  HENT:  Head: Normocephalic and atraumatic.  Eyes: Conjunctivae, EOM and lids are normal. Pupils are equal, round, and reactive to light.  Neck: Trachea normal, normal range of motion and full passive range of motion without pain. Neck supple.  Cardiovascular: Normal rate, regular rhythm and normal heart sounds.   Pulmonary/Chest: Effort normal and breath sounds normal. No respiratory distress. He has no wheezes. He has no rales.  Abdominal:  Soft. Normal appearance. He exhibits no distension. There is no tenderness. There is no rebound and no CVA tenderness.  Musculoskeletal: Normal range of motion.  Neurological: He is alert and oriented to person, place, and time. He has normal strength.  Skin: Skin is warm, dry and intact. No rash noted.  Psychiatric: He has a normal mood and affect. His  behavior is normal. Judgment and thought content normal.    ED Course  Procedures (including critical care time)  Labs Reviewed - No data to display Dg Chest 2 View  10/20/2011  *RADIOLOGY REPORT*  Clinical Data: Cough.  Chest discomfort.  Sore throat.  Asthma.  CHEST - 2 VIEW  Comparison:  05/27/2011  Findings:  The heart size and mediastinal contours are within normal limits.  Both lungs are clear.  The visualized skeletal structures are unremarkable.  IMPRESSION: No active cardiopulmonary disease.  Original Report Authenticated By: Danae Orleans, M.D.     1. Chest pain       MDM  Patient with unclear etiology for his chest pain at this time.  He does not appear to have pneumonia or other abnormalities on his chest x-ray.  If his young age it is unlikely he is a GCS.  I discussed with him the possibility of acid reflux.  Patient also endorses that is worse with smoking and I have encouraged him to quit smoking.  Patient has normal vital signs is otherwise in no distress I believe is safe for discharge home.        Nat Christen, MD 10/20/11 [phone]

## 2011-10-20 NOTE — ED Notes (Signed)
Patient reports he has had a cold with sore throat and chest pain for 2 weeks.  Patient denies fever.

## 2011-10-20 NOTE — ED Notes (Signed)
Patient transported to X-ray 

## 2011-10-20 NOTE — Discharge Instructions (Signed)
Chest Pain (Nonspecific) It is often hard to give a specific diagnosis for the cause of chest pain. There is always a chance that your pain could be related to something serious, such as a heart attack or a blood clot in the lungs. You need to follow up with your caregiver for further evaluation. CAUSES   Heartburn.   Pneumonia or bronchitis.   Anxiety or stress.   Inflammation around your heart (pericarditis) or lung (pleuritis or pleurisy).   A blood clot in the lung.   A collapsed lung (pneumothorax). It can develop suddenly on its own (spontaneous pneumothorax) or from injury (trauma) to the chest.   Shingles infection (herpes zoster virus).  The chest wall is composed of bones, muscles, and cartilage. Any of these can be the source of the pain.  The bones can be bruised by injury.   The muscles or cartilage can be strained by coughing or overwork.   The cartilage can be affected by inflammation and become sore (costochondritis).  DIAGNOSIS  Lab tests or other studies, such as X-rays, electrocardiography, stress testing, or cardiac imaging, may be needed to find the cause of your pain.  TREATMENT   Treatment depends on what may be causing your chest pain. Treatment may include:   Acid blockers for heartburn.   Anti-inflammatory medicine.   Pain medicine for inflammatory conditions.   Antibiotics if an infection is present.   You may be advised to change lifestyle habits. This includes stopping smoking and avoiding alcohol, caffeine, and chocolate.   You may be advised to keep your head raised (elevated) when sleeping. This reduces the chance of acid going backward from your stomach into your esophagus.   Most of the time, nonspecific chest pain will improve within 2 to 3 days with rest and mild pain medicine.  HOME CARE INSTRUCTIONS   If antibiotics were prescribed, take your antibiotics as directed. Finish them even if you start to feel better.   For the next few  days, avoid physical activities that bring on chest pain. Continue physical activities as directed.   Do not smoke.   Avoid drinking alcohol.   Only take over-the-counter or prescription medicine for pain, discomfort, or fever as directed by your caregiver.   Follow your caregiver's suggestions for further testing if your chest pain does not go away.   Keep any follow-up appointments you made. If you do not go to an appointment, you could develop lasting (chronic) problems with pain. If there is any problem keeping an appointment, you must call to reschedule.  SEEK MEDICAL CARE IF:   You think you are having problems from the medicine you are taking. Read your medicine instructions carefully.   Your chest pain does not go away, even after treatment.   You develop a rash with blisters on your chest.  SEEK IMMEDIATE MEDICAL CARE IF:   You have increased chest pain or pain that spreads to your arm, neck, jaw, back, or abdomen.   You develop shortness of breath, an increasing cough, or you are coughing up blood.   You have severe back or abdominal pain, feel nauseous, or vomit.   You develop severe weakness, fainting, or chills.   You have a fever.  THIS IS AN EMERGENCY. Do not wait to see if the pain will go away. Get medical help at once. Call your local emergency services (911 in [place] Do not drive yourself to the hospital. MAKE SURE YOU:   Understand these instructions.     Will watch your condition.   Will get help right away if you are not doing well or get worse.  Document Released: 04/22/2005 Document Revised: 07/02/2011 Document Reviewed: 02/16/2008 Exit[employer] Patient Information 2012 [employer]. 

## 2011-10-20 NOTE — ED Notes (Signed)
Pt states that he has had a cough and sore throat for 2 days.  Pt states that he has had chest tightness with cough.  No resp distress noted.

## 2011-11-02 ENCOUNTER — Encounter (HOSPITAL_COMMUNITY): Payer: Self-pay | Admitting: *Deleted

## 2011-11-02 ENCOUNTER — Emergency Department (HOSPITAL_COMMUNITY): Payer: Medicaid Other

## 2011-11-02 ENCOUNTER — Emergency Department (HOSPITAL_COMMUNITY)
Admission: EM | Admit: 2011-11-02 | Discharge: 2011-11-02 | Disposition: A | Discharge status: Home / Self Care | Payer: Medicaid Other | Attending: Emergency Medicine | Admitting: Emergency Medicine

## 2011-11-02 DIAGNOSIS — S8990XA Unspecified injury of unspecified lower leg, initial encounter: Secondary | ICD-10-CM | POA: Insufficient documentation

## 2011-11-02 DIAGNOSIS — Y9367 Activity, basketball: Secondary | ICD-10-CM | POA: Insufficient documentation

## 2011-11-02 DIAGNOSIS — R209 Unspecified disturbances of skin sensation: Secondary | ICD-10-CM | POA: Insufficient documentation

## 2011-11-02 DIAGNOSIS — W219XXA Striking against or struck by unspecified sports equipment, initial encounter: Secondary | ICD-10-CM | POA: Insufficient documentation

## 2011-11-02 DIAGNOSIS — Y9239 Other specified sports and athletic area as the place of occurrence of the external cause: Secondary | ICD-10-CM | POA: Insufficient documentation

## 2011-11-02 DIAGNOSIS — M25572 Pain in left ankle and joints of left foot: Secondary | ICD-10-CM

## 2011-11-02 DIAGNOSIS — M25579 Pain in unspecified ankle and joints of unspecified foot: Secondary | ICD-10-CM | POA: Insufficient documentation

## 2011-11-02 DIAGNOSIS — S99929A Unspecified injury of unspecified foot, initial encounter: Secondary | ICD-10-CM | POA: Insufficient documentation

## 2011-11-02 DIAGNOSIS — M79609 Pain in unspecified limb: Secondary | ICD-10-CM | POA: Insufficient documentation

## 2011-11-02 MED ORDER — IBUPROFEN 800 MG PO TABS
800.0000 mg | ORAL_TABLET | Freq: Once | ORAL | Status: AC
  Administered 2011-11-02: 800 mg via ORAL
  Filled 2011-11-02: qty 1

## 2011-11-02 MED ORDER — IBUPROFEN 800 MG PO TABS: 800.0000 mg | ORAL_TABLET | Freq: Three times a day (TID) | ORAL | Status: AC | PRN

## 2011-11-02 NOTE — ED Notes (Signed)
Pt reports he was playing basketball yesterday and states he rolled on his ankle wrong, states has numbness to inside of left foot and discomfort to ankle. Pt ambulatory on arrival with limp. Pt able to wiggle toes.

## 2011-11-02 NOTE — ED Notes (Signed)
Patient transported to X-ray 

## 2011-11-02 NOTE — ED Notes (Signed)
States rolling ankle while playing basketball yesterday. Presents with minimally swollen left ankle, no deformity. C/o numbness on ball and arch of same foot. Pt retains full rom, pedal pulses intact.

## 2011-11-02 NOTE — ED Notes (Signed)
Returned from xray

## 2011-11-02 NOTE — Discharge Instructions (Signed)
Please use the RICE treatment outlined below.  Use the resources below to find a primary care provider for follow up.  You may return to the ER at any time for worsening condition or any new symptoms that concern you.  Ankle Pain Ankle pain is a common symptom. The bones, cartilage, tendons, and muscles of the ankle joint perform a lot of work each day. The ankle joint holds your body weight and allows you to move around. Ankle pain can occur on either side or back of 1 or both ankles. Ankle pain may be sharp and burning or dull and aching. There may be tenderness, stiffness, redness, or warmth around the ankle. The pain occurs more often when a person walks or puts pressure on the ankle. CAUSES  There are many reasons ankle pain can develop. It is important to work with your caregiver to identify the cause since many conditions can impact the bones, cartilage, muscles, and tendons. Causes for ankle pain include:  Injury, including a break (fracture), sprain, or strain often due to a fall, sports, or a high-impact activity.   Swelling (inflammation) of a tendon (tendonitis).   Achilles tendon rupture.   Ankle instability after repeated sprains and strains.   Poor foot alignment.   Pressure on a nerve (tarsal tunnel syndrome).   Arthritis in the ankle or the lining of the ankle.   Crystal formation in the ankle (gout or pseudogout).  DIAGNOSIS  A diagnosis is based on your medical history, your symptoms, results of your physical exam, and results of diagnostic tests. Diagnostic tests may include X-ray exams or a computerized magnetic scan (magnetic resonance imaging, MRI). TREATMENT  Treatment will depend on the cause of your ankle pain and may include:  Keeping pressure off the ankle and limiting activities.   Using crutches or other walking support (a cane or brace).   Using rest, ice, compression, and elevation.   Participating in physical therapy or home exercises.   Wearing  shoe inserts or special shoes.   Losing weight.   Taking medications to reduce pain or swelling or receiving an injection.   Undergoing surgery.  HOME CARE INSTRUCTIONS   Only take over-the-counter or prescription medicines for pain, discomfort, or fever as directed by your caregiver.   Put ice on the injured area.   Put ice in a plastic bag.   Place a towel between your skin and the bag.   Leave the ice on for 15 to 20 minutes at a time, 3 to 4 times a day.   Keep your leg raised (elevated) when possible to lessen swelling.   Avoid activities that cause ankle pain.   Follow specific exercises as directed by your caregiver.   Record how often you have ankle pain, the location of the pain, and what it feels like. This information may be helpful to you and your caregiver.   Ask your caregiver about returning to work or sports and whether you should drive.   Follow up with your caregiver for further examination, therapy, or testing as directed.  SEEK MEDICAL CARE IF:   Pain or swelling continues or worsens beyond 1 week.   You have an oral temperature above 102 F (38.9 C).   You are feeling unwell or have chills.   You are having an increasingly difficult time with walking.   You have loss of sensation or other new symptoms.   You have questions or concerns.  MAKE SURE YOU:   Understand these  instructions.   Will watch your condition.   Will get help right away if you are not doing well or get worse.  Document Released: 12/31/2009 Document Revised: 07/02/2011 Document Reviewed: 12/31/2009 [facility] Patient Information 2012 [place], [place].  If you have no primary doctor, here are some resources that may be helpful:  Medicaid-accepting [facility] Providers:   - [facility]- [address]      [phone]      Mon-Fri 9am-7pm, Sat 9am-1pm   - [facility]- [address] [place]      [phone]   - [facility]- [address] [place]      [phone]   [facility]- [address]      [phone]   - Renaye Rakers- [address] [place], [address]      [phone]      Only accepts [place] patients       after they have her name applied to their card   Self Pay (no insurance) in [place]:   - Sickle Cell Patients: Dr Willey Blade, [facility]      [address] [place]      [phone]   - [phone]   - Physician Referral Service- [phone]   - [facility]- [address] [place]      [phone]   [facility] [place]- [address]   - [facility]- see information above      (Speak to [employer] if you do not have insurance)   - [facility]- [address] [place]      [phone]   - [facility] [place]- [address]      [phone]   - [facility]- [address]      [phone]   - Dr Julio Sicks-  [address], [place]      [phone]   - [facility]- [address]      [phone]   - [facility]- [address]      [phone]      Also [address]      [phone]   - [facility]- [address]      [phone]      1st and 3rd Saturday every month, 10am-1pm Other agencies that provide inexpensive medical care:    [facility]  [phone]    [facility]  [phone]    [facility]  [phone]    [facility]  [phone]    [facility]  [phone]  General Information: Finding a doctor when you do not have health insurance can be tricky. Although you are not limited by an insurance plan, you are of course limited by her finances and how much but he can pay out of pocket.  What are your options if you don't have health insurance?   1) Find a Librarian, academic and Pay Out of Pocket Although you won't have to find out who is  covered by your insurance plan, it is a good idea to ask around and get recommendations. You will then need to call the office and see if the doctor you have chosen will accept you as a new patient and what types of options  they offer for patients who are self-pay. Some doctors offer discounts or will set up payment plans for their patients who do not have insurance, but you will need to ask so you aren't surprised when you get to your appointment.  2) Contact Your Local Health Department Not all health departments have doctors that can see patients for sick visits, but many do, so it is worth a call to see if yours does. If you don't know where your local health department is, you can check in your phone book. The CDC also has a tool to help you locate your state's health department, and many state websites also have listings of all of their local health departments.  3) Find a Walk-in Clinic If your illness is not likely to be very severe or complicated, you may want to try a walk in clinic. These are popping up all over the country in pharmacies, drugstores, and shopping centers. They're usually staffed by nurse practitioners or physician assistants that have been trained to treat common illnesses and complaints. They're usually fairly quick and inexpensive. However, if you have serious medical issues or chronic medical problems, these are probably not your best option   RESOURCE GUIDE  Dental Problems  Patients with Medicaid: [facility] [phone]                                           [phone] [address] Phone:  [phone]                                                  Phone:  [phone]  If unable to pay or uninsured, contact:  [facility] or [facility] to become qualified for the adult dental clinic.  Chronic Pain Problems Contact [facility]  [phone] Patients need to be referred by  their primary care doctor.  Insufficient Money for Medicine Contact [employer]:  call "211" or [facility] [phone].  No Primary Care Doctor Call [facility]  [phone] Other agencies that provide inexpensive medical care    [facility]  [phone]    [facility]  [phone]    [facility]  [phone]    [facility]  [phone]    [facility]  [phone]    [facility]  [phone]  Psychological Services [facility]  [phone] [facility]  [phone] [facility]   [phone] (emergency services [phone])  Substance Abuse Resources [facility]  [phone] [facility] [phone] [facility] [place] [phone] Floydene Flock [phone] Residential & Outpatient Substance Abuse Program  [phone]  Abuse/Neglect [facility] Child Abuse Hotline (561)025-9682 [facility] Child Abuse Hotline 810-809-7458 (After Hours)  Emergency Shelter [facility] [phone]  Maternity Homes [facility] [place] [facility] [phone] [facility] [phone]  MRSA Hotline #:   860-445-2155    [facility] [place]  [facility] Dept. [address]      [place] Phone:  [phone]                                   Phone:  [phone]                 Phone:  [place] Phone:  [place] [phone] (After Hours)

## 2011-11-02 NOTE — ED Provider Notes (Signed)
History     CSN: [id number]  Arrival date & time 11/02/11  0846   First MD Initiated Contact with Patient 11/02/11 [phone]      Chief Complaint  Patient presents with  . Foot Injury    (Consider location/radiation/quality/duration/timing/severity/associated sxs/prior treatment) HPI Comments: Patient reports he was playing basketball yesterday when he jumped up for a ball and a large player landed on his foot, causing him to invert his foot.  Reports pain across the anterior aspect of his ankle and top of his foot, decreased sensation over the instep of his foot.  Has taken nothing for pain.  Denies other injury.   Patient is a 26 y.o. male presenting with foot injury. The history is provided by the patient.  Foot Injury     Past Medical History  Diagnosis Date  . Seasonal allergies     Past Surgical History  Procedure Date  . Dental surgery     History reviewed. No pertinent family history.  History  Substance Use Topics  . Smoking status: Current Everyday Smoker -- 1.0 packs/day    Types: Cigarettes  . Smokeless tobacco: Not on file  . Alcohol Use: No     stopped x 3 weeks      Review of Systems  Musculoskeletal: Negative for back pain.  Neurological: Negative for weakness.  All other systems reviewed and are negative.    Allergies  Shellfish-derived products  Home Medications  No current outpatient prescriptions on file.  BP 118/81  Pulse 69  Temp(Src) 97.9 F (36.6 C) (Oral)  Resp 16  SpO2 97%  Physical Exam  Constitutional: He is oriented to person, place, and time. He appears well-developed and well-nourished.  HENT:  Head: Normocephalic and atraumatic.  Neck: Neck supple.  Pulmonary/Chest: Effort normal.  Musculoskeletal:       Left foot: He exhibits tenderness. He exhibits normal range of motion, no bony tenderness, no swelling, no crepitus and no deformity.       Feet:       Left foot with diffuse tenderness, no bony tenderness.  Mild  decreased sensation without weakness.  Capillary refill < 2 seconds.    Neurological: He is alert and oriented to person, place, and time.    ED Course  Procedures (including critical care time)  Labs Reviewed - No data to display Dg Ankle Complete Right  11/02/2011  *RADIOLOGY REPORT*  Clinical Data: Medial ankle pain following injury yesterday.  RIGHT ANKLE - COMPLETE 3+ VIEW  Comparison: None.  Findings: The mineralization and alignment are normal.  There is no evidence of acute fracture or dislocation.  No soft tissue abnormalities are identified.  IMPRESSION: No acute osseous findings.  Original Report Authenticated By: Gerrianne Scale, M.D.   Xray is marked "right" but is actually of the left ankle where patient is having pain.     1. Left ankle pain       MDM  Patient with injury to left ankle yesterday while playing basketball - had larger player land on his foot and then inverted his ankle.  Pain is in anterior ankle, no specific bony tenderness. Xray is negative.  Pt placed in ACE bandage, given RICE care instructions.  Pt d/c home with NSAIDs.  Patient verbalizes understanding and agrees with plan.          Rise Patience, [place] 11/02/11 1239

## 2011-11-03 NOTE — ED Provider Notes (Signed)
Medical screening examination/treatment/procedure(s) were performed by non-physician practitioner and as supervising physician I was immediately available for consultation/collaboration.   Leigh-Ann Denise Washburn, MD 11/03/11 0734 

## 2011-11-21 ENCOUNTER — Emergency Department (HOSPITAL_COMMUNITY)
Admission: EM | Admit: 2011-11-21 | Discharge: 2011-11-21 | Disposition: A | Discharge status: Home / Self Care | Payer: Medicaid Other | Attending: Emergency Medicine | Admitting: Emergency Medicine

## 2011-11-21 ENCOUNTER — Encounter (HOSPITAL_COMMUNITY): Payer: Self-pay | Admitting: Emergency Medicine

## 2011-11-21 DIAGNOSIS — F17200 Nicotine dependence, unspecified, uncomplicated: Secondary | ICD-10-CM | POA: Insufficient documentation

## 2011-11-21 DIAGNOSIS — R0981 Nasal congestion: Secondary | ICD-10-CM

## 2011-11-21 DIAGNOSIS — J3489 Other specified disorders of nose and nasal sinuses: Secondary | ICD-10-CM | POA: Insufficient documentation

## 2011-11-21 MED ORDER — IBUPROFEN 800 MG PO TABS
800.0000 mg | ORAL_TABLET | Freq: Once | ORAL | Status: AC
  Administered 2011-11-21: 800 mg via ORAL
  Filled 2011-11-21: qty 1

## 2011-11-21 MED ORDER — DIPHENHYDRAMINE HCL 25 MG PO CAPS
25.0000 mg | ORAL_CAPSULE | Freq: Once | ORAL | Status: AC
  Administered 2011-11-21: 25 mg via ORAL
  Filled 2011-11-21: qty 1

## 2011-11-21 NOTE — ED Notes (Signed)
Pt. Stated, I've had congestion and sinus problem since last night.

## 2011-11-21 NOTE — ED Provider Notes (Signed)
History     CSN: [id number]  Arrival date & time 11/21/11  0741   First MD Initiated Contact with Patient 11/21/11 [phone]      Chief Complaint  Patient presents with  . Nasal Congestion    (Consider location/radiation/quality/duration/timing/severity/associated sxs/prior treatment) HPI  Patient presents to emergency department complaining of gradual onset of nasal congestion and sinus pressure that began last night and has been persistent throughout the night. Patient is taking no medications prior to arrival for the symptoms. Patient states he has history of seasonal allergies but no other known medical problems and takes no medicines on a regular basis. Patient denies fevers, chills, headache, shortness of breath, difficulty breathing or swallowing. Denies aggravating or alleviating factors.  Past Medical History  Diagnosis Date  . Seasonal allergies     Past Surgical History  Procedure Date  . Dental surgery     No family history on file.  History  Substance Use Topics  . Smoking status: Current Everyday Smoker -- 1.0 packs/day    Types: Cigarettes  . Smokeless tobacco: Not on file  . Alcohol Use: No     stopped x 3 weeks      Review of Systems  Constitutional: Negative for fever and chills.  HENT: Positive for congestion, rhinorrhea, sneezing and sinus pressure. Negative for ear pain, facial swelling, neck pain, neck stiffness and ear discharge.   Eyes: Negative for discharge.  Cardiovascular: Negative for chest pain.  Gastrointestinal: Negative for abdominal pain.  Musculoskeletal: Negative for arthralgias.  Skin: Negative for color change and rash.  Neurological: Negative for dizziness and headaches.    Allergies  Shellfish-derived products  Home Medications  No current outpatient prescriptions on file.  BP 111/70  Pulse 73  Temp(Src) 98.2 F (36.8 C) (Oral)  Resp 16  SpO2 98%  Physical Exam  Vitals reviewed. Constitutional: He is oriented to  person, place, and time. He appears well-developed and well-nourished. No distress.  HENT:  Head: Normocephalic and atraumatic.  Right Ear: External ear normal.  Left Ear: External ear normal.  Nose: Nose normal.  Mouth/Throat: No oropharyngeal exudate.       Mild erythema of posterior pharynx and tonsils no tonsillar exudate or enlargement. Patent airway. Swallowing secretions well  boggy nasal mucosa bilaterally.  Eyes: Conjunctivae are normal.  Neck: Normal range of motion. Neck supple.  Cardiovascular: Normal rate, regular rhythm and normal heart sounds.  Exam reveals no gallop and no friction rub.   No murmur heard. Pulmonary/Chest: Effort normal and breath sounds normal. No respiratory distress. He has no wheezes. He has no rales. He exhibits no tenderness.  Abdominal: Soft. He exhibits no distension and no mass. There is no tenderness. There is no rebound and no guarding.  Lymphadenopathy:    He has no cervical adenopathy.  Neurological: He is alert and oriented to person, place, and time. He has normal reflexes.  Skin: Skin is warm and dry. No rash noted. He is not diaphoretic.  Psychiatric: He has a normal mood and affect.    ED Course  Procedures (including critical care time)  By mouth Benadryl and ibuprofen.  Labs Reviewed - No data to display No results found.   1. Nasal congestion       MDM  Patient is afebrile and nontoxic-appearing. Signs symptoms of nasal congestion and sinus pressure. Spoke at length with patient about the need for primary care establishment for minor illness and injury over reasons to return to the emergency department. Patient  voices understanding and is agreeable plan.        Lenon Oms [place], [place] 11/21/11 [phone]

## 2011-11-21 NOTE — Discharge Instructions (Signed)
Stay well-hydrated. Take benadryl to decrease secretions.. Continued to alternate between Tylenol and ibuprofen for pain. Use over the counter flonase to help with nasal congestion. Establishment with a Primary Care provider is Very important for general health care concerns, minor illness and minor injury. Return to ER for emergent changing or worsening of symptoms.

## 2011-11-22 NOTE — ED Provider Notes (Signed)
Medical screening examination/treatment/procedure(s) were performed by non-physician practitioner and as supervising physician I was immediately available for consultation/collaboration.  Jaelin Fackler R. Serigne Kubicek, MD 11/22/11 0703 

## 2011-11-27 ENCOUNTER — Emergency Department (HOSPITAL_COMMUNITY)
Admission: EM | Admit: 2011-11-27 | Discharge: 2011-11-27 | Disposition: A | Discharge status: Home / Self Care | Payer: Medicaid Other | Attending: Emergency Medicine | Admitting: Emergency Medicine

## 2011-11-27 ENCOUNTER — Encounter (HOSPITAL_COMMUNITY): Payer: Self-pay | Admitting: *Deleted

## 2011-11-27 ENCOUNTER — Emergency Department (HOSPITAL_COMMUNITY): Payer: Medicaid Other

## 2011-11-27 DIAGNOSIS — R079 Chest pain, unspecified: Secondary | ICD-10-CM | POA: Insufficient documentation

## 2011-11-27 DIAGNOSIS — R51 Headache: Secondary | ICD-10-CM | POA: Insufficient documentation

## 2011-11-27 DIAGNOSIS — F17200 Nicotine dependence, unspecified, uncomplicated: Secondary | ICD-10-CM | POA: Insufficient documentation

## 2011-11-27 DIAGNOSIS — R062 Wheezing: Secondary | ICD-10-CM | POA: Insufficient documentation

## 2011-11-27 DIAGNOSIS — J302 Other seasonal allergic rhinitis: Secondary | ICD-10-CM

## 2011-11-27 DIAGNOSIS — J40 Bronchitis, not specified as acute or chronic: Secondary | ICD-10-CM | POA: Insufficient documentation

## 2011-11-27 DIAGNOSIS — J309 Allergic rhinitis, unspecified: Secondary | ICD-10-CM | POA: Insufficient documentation

## 2011-11-27 DIAGNOSIS — R0602 Shortness of breath: Secondary | ICD-10-CM | POA: Insufficient documentation

## 2011-11-27 MED ORDER — ALBUTEROL SULFATE HFA 108 (90 BASE) MCG/ACT IN AERS
2.0000 | INHALATION_SPRAY | Freq: Once | RESPIRATORY_TRACT | Status: AC
  Administered 2011-11-27: 2 via RESPIRATORY_TRACT
  Filled 2011-11-27: qty 6.7

## 2011-11-27 MED ORDER — CETIRIZINE-PSEUDOEPHEDRINE ER 5-120 MG PO TB12: 1.0000 | ORAL_TABLET | Freq: Every day | ORAL | Status: DC

## 2011-11-27 NOTE — ED Notes (Signed)
Patient verbalized complete understanding of the d/c home instructions and medications with f/u care

## 2011-11-27 NOTE — ED Notes (Signed)
The pt has had a cold and cough for 2 weeks.  He was seen here Saturday night

## 2011-11-27 NOTE — ED Provider Notes (Signed)
History     CSN: [id number]  Arrival date & time 11/27/11  0032   First MD Initiated Contact with Patient 11/27/11 0101      Chief Complaint  Patient presents with  . cold cough     (Consider location/radiation/quality/duration/timing/severity/associated sxs/prior treatment) Patient is a 26 y.o. male presenting with URI. The history is provided by the patient.  URI The primary symptoms include headaches, sore throat and cough. Primary symptoms do not include fever, nausea, vomiting or myalgias. The current episode started more than 1 week ago. This is a new problem. The problem has not changed since onset. The headache is not associated with neck stiffness.  Symptoms associated with the illness include plugged ear sensation, sinus pressure and congestion. The illness is not associated with chills.  Pt reports watery itchy eyes, nasal congestion, sore throat, cough for the last two weeks. He was seen here several days ago, diagnosed with nasal congestion. Pt states he is not taking any medications and does not have a primary care doctor. Took benadryl once two days ago with no relief. He states the reason he came back is because today he felt short of breath, and coughing worsened. Pt has no other complaitns.   Past Medical History  Diagnosis Date  . Seasonal allergies     Past Surgical History  Procedure Date  . Dental surgery     No family history on file.  History  Substance Use Topics  . Smoking status: Current Everyday Smoker -- 1.0 packs/day    Types: Cigarettes  . Smokeless tobacco: Not on file  . Alcohol Use: No     stopped x 3 weeks      Review of Systems  Constitutional: Negative for fever and chills.  HENT: Positive for congestion, sore throat and sinus pressure. Negative for neck pain and neck stiffness.   Eyes: Negative.   Respiratory: Positive for cough and shortness of breath. Negative for chest tightness.   Cardiovascular: Positive for chest pain.  Negative for palpitations and leg swelling.  Gastrointestinal: Negative.  Negative for nausea and vomiting.  Genitourinary: Negative.   Musculoskeletal: Negative.  Negative for myalgias.  Skin: Negative.   Neurological: Positive for headaches.    Allergies  Shellfish-derived products  Home Medications   Current Outpatient Rx  Name Route Sig Dispense Refill  . DIPHENHYDRAMINE HCL 25 MG PO TABS Oral Take 25 mg by mouth every 6 (six) hours as needed. For sinuses      BP 119/81  Pulse 61  Temp(Src) 97.8 F (36.6 C) (Oral)  Resp 18  SpO2 95%  Physical Exam  Nursing note and vitals reviewed. Constitutional: He is oriented to person, place, and time. He appears well-developed and well-nourished. No distress.  HENT:  Head: Normocephalic and atraumatic.  Right Ear: External ear normal.  Left Ear: External ear normal.  Mouth/Throat: Oropharynx is clear and moist. No oropharyngeal exudate.       Nasal congestion  Eyes: Conjunctivae are normal. Pupils are equal, round, and reactive to light.       Eyes watery  Neck: Normal range of motion. Neck supple.  Cardiovascular: Normal rate, regular rhythm and normal heart sounds.   Pulmonary/Chest: Effort normal. He has wheezes.  Abdominal: Soft. He exhibits no distension. There is no tenderness.  Musculoskeletal: Normal range of motion. He exhibits no edema.  Lymphadenopathy:    He has no cervical adenopathy.  Neurological: He is alert and oriented to person, place, and time.  Skin: Skin  is warm and dry.  Psychiatric: He has a normal mood and affect.    ED Course  Procedures (including critical care time)  Pt with what seems like seasonal allergies, also wheezing on exam. Will give albuterol inhaler for cough and wheezing. Will get CXR to r/o pneumonia Dg Chest 2 View  11/27/2011  *RADIOLOGY REPORT*  Clinical Data: Chest pain, cough, shortness of breath.  CHEST - 2 VIEW  Comparison: 10/20/2011  Findings: Lungs are clear. No pleural  effusion or pneumothorax. The cardiomediastinal contours are within normal limits. The visualized bones and soft tissues are without significant appreciable abnormality.  IMPRESSION: No radiographic evidence for acute cardiopulmonary process.  Original Report Authenticated By: Waneta Martins, M.D.    1:56 AM CXR negative. Will treat with decongestant and allergy medications, albuterol. Pt afebrile, non toxic appearing. VS all within normal. Discussed cessation of smoking and to follow up with primary care doctor.       1. Seasonal allergies   2. Bronchitis       MDM          Lottie Mussel, PA 11/27/11 0157

## 2011-11-27 NOTE — Discharge Instructions (Signed)
Your x-ray is normal today. Stop smoking. Take inhaler 2 puffs every 4 hrs for shortness of breath and cough. Take zyrtec D as prescribed for congestion. Follow up with your doctor.   RESOURCE GUIDE  Dental Problems  Patients with Medicaid: [facility] [id number]                                           [phone] [address] Phone:  [phone]                                                  Phone:  [phone]  If unable to pay or uninsured, contact:  [facility] or [facility]. to become qualified for the adult dental clinic.  Chronic Pain Problems Contact [facility]  [phone] Patients need to be referred by their primary care doctor.  Insufficient Money for Medicine Contact [employer]:  call "211" or [facility] [phone].  No Primary Care Doctor Call [facility]  [phone] Other agencies that provide inexpensive medical care    [facility]  [phone]    [facility]  [phone]    [facility]  [phone]    [facility]  [phone]    [facility]  [phone]    [facility]  [phone]  Psychological Services [facility]  [phone] [facility] Services  [phone] [facility]   [phone] (emergency services [phone])  Substance Abuse Resources [facility]  [phone] [facility] [phone] [facility] [phone] Floydene Flock [phone] Residential & Outpatient Substance Abuse Program  [phone]  Abuse/Neglect [facility] Child Abuse Hotline [phone] [facility] Child Abuse Hotline [phone] (After Hours)  Emergency Shelter [facility] Ministries [phone]  Maternity Homes Room at the [place] [facility] [place] [phone] [facility] Services [phone]  MRSA Hotline #:   212 244 6083    [facility]  Resources  [facility] [place]     [employer]                          [facility] Dept. [address] [place]                       [address]      [address]  [place]                                                Cristobal Goldmann Phone:  [phone]  Phone:  [phone]                 Phone:  [phone]  [place] [facility] Phone:  [phone]  [place] Child Abuse Hotline [phone]) 342-1394 [phone]) [phone] (After Hours)   

## 2011-11-30 NOTE — ED Provider Notes (Signed)
Medical screening examination/treatment/procedure(s) were performed by non-physician practitioner and as supervising physician I was immediately available for consultation/collaboration.  Zelie Asbill M Jizel Cheeks, MD 11/30/11 2338 

## 2012-02-28 ENCOUNTER — Encounter (HOSPITAL_COMMUNITY): Payer: Self-pay

## 2012-02-28 ENCOUNTER — Emergency Department (HOSPITAL_COMMUNITY)
Admission: EM | Admit: 2012-02-28 | Discharge: 2012-02-29 | Disposition: A | Discharge status: Home / Self Care | Payer: Medicare Other | Attending: Emergency Medicine | Admitting: Emergency Medicine

## 2012-02-28 DIAGNOSIS — X58XXXA Exposure to other specified factors, initial encounter: Secondary | ICD-10-CM | POA: Insufficient documentation

## 2012-02-28 DIAGNOSIS — F17200 Nicotine dependence, unspecified, uncomplicated: Secondary | ICD-10-CM | POA: Insufficient documentation

## 2012-02-28 DIAGNOSIS — IMO0002 Reserved for concepts with insufficient information to code with codable children: Secondary | ICD-10-CM | POA: Insufficient documentation

## 2012-02-28 DIAGNOSIS — S76919A Strain of unspecified muscles, fascia and tendons at thigh level, unspecified thigh, initial encounter: Secondary | ICD-10-CM

## 2012-02-28 NOTE — ED Notes (Addendum)
Pt. Reports pain in left thigh. Reports hearing a pop while playing basketball approx 1 hour ago. The pain in his left thigh brought him to his knees. Reports numbness in left thigh.  No brusing or swelling noted at this time in LLE.

## 2012-02-29 ENCOUNTER — Encounter (HOSPITAL_COMMUNITY): Payer: Self-pay | Admitting: Emergency Medicine

## 2012-02-29 NOTE — ED Notes (Signed)
Patient is alert and oriented x4.  Discharge instructions were explained and no questions were asked.  Patient is able to walk.  Patient's fiancee is coming to transport him home.

## 2012-02-29 NOTE — ED Provider Notes (Signed)
History     CSN: [id number]  Arrival date & time 02/28/12  2253   First MD Initiated Contact with Patient 02/28/12 2325      Chief Complaint  Patient presents with  . Leg Pain   HPI  History provided by the patient. Patient is a 26 year old male with no significant PMH who presents with complaints of left thigh and leg pain. Patient states that he was playing basketball and came down after jumping for a rebound and had sharp pains to his left thigh and knee area. Pain is mostly in the thigh area at this time. Patient states he felt possible popping to the area. Patient has been ambulatory. He denies any pain with range of motion of knee. He denies any swelling. She denies any other injury. He denies any numbness weakness to the foot. Patient has not used any treatments for symptoms.    Past Medical History  Diagnosis Date  . Seasonal allergies     Past Surgical History  Procedure Date  . Dental surgery     History reviewed. No pertinent family history.  History  Substance Use Topics  . Smoking status: Current Everyday Smoker -- 1.0 packs/day    Types: Cigarettes  . Smokeless tobacco: Not on file  . Alcohol Use: No     stopped x 3 weeks      Review of Systems  Musculoskeletal: Negative for joint swelling.       Left thigh pain  Neurological: Negative for weakness and numbness.    Allergies  Shellfish-derived products  Home Medications   Current Outpatient Rx  Name Route Sig Dispense Refill  . ALBUTEROL SULFATE HFA 108 (90 BASE) MCG/ACT IN AERS Inhalation Inhale 2 puffs into the lungs every 6 (six) hours as needed. For wheezing    . IBUPROFEN 200 MG PO TABS Oral Take 200 mg by mouth once.      BP 117/73  Pulse 80  Temp 98 F (36.7 C) (Oral)  Resp 12  SpO2 98%  Physical Exam  Nursing note and vitals reviewed. Constitutional: He is oriented to person, place, and time. He appears well-developed and well-nourished. No distress.  HENT:  Head:  Normocephalic.  Cardiovascular: Normal rate and regular rhythm.   Pulmonary/Chest: Effort normal and breath sounds normal.  Musculoskeletal:       Full range of motion of left knee. Patient is able to hold leg in extension of the bed. No signs for patellar tendon rupture. No masses in thigh. There is tenderness over the left lateral aspect of the thigh. No bruising or swelling. Negative anterior posterior drawer test the knee. No increased laxity with valgus or varus stress. No tenderness over the knee. Normal distal sensations and pulses.  Neurological: He is alert and oriented to person, place, and time.  Skin: Skin is warm. No erythema.  Psychiatric: He has a normal mood and affect. His behavior is normal.    ED Course  Procedures      1. Muscle strain of thigh       MDM  12:20 AM patient seen and evaluated. No concerning findings on exam. No deformities. Patient is able to read. No medications for imaging at this time.        Angus Seller, [place] 02/29/12 [phone]

## 2012-02-29 NOTE — ED Provider Notes (Signed)
Medical screening examination/treatment/procedure(s) were performed by non-physician practitioner and as supervising physician I was immediately available for consultation/collaboration.   Shereen Marton, MD 02/29/12 0322 

## 2013-04-03 ENCOUNTER — Encounter (HOSPITAL_COMMUNITY): Payer: Self-pay | Admitting: Emergency Medicine

## 2013-04-03 ENCOUNTER — Emergency Department (HOSPITAL_COMMUNITY)
Admission: EM | Admit: 2013-04-03 | Discharge: 2013-04-03 | Discharge status: Against Medical Advice | Payer: Medicare Other | Attending: Emergency Medicine | Admitting: Emergency Medicine

## 2013-04-03 ENCOUNTER — Emergency Department (HOSPITAL_COMMUNITY): Payer: Medicare Other

## 2013-04-03 ENCOUNTER — Emergency Department (HOSPITAL_COMMUNITY)
Admission: EM | Admit: 2013-04-03 | Discharge: 2013-04-03 | Disposition: A | Discharge status: Home / Self Care | Payer: No Typology Code available for payment source | Attending: Emergency Medicine | Admitting: Emergency Medicine

## 2013-04-03 DIAGNOSIS — Y9389 Activity, other specified: Secondary | ICD-10-CM | POA: Insufficient documentation

## 2013-04-03 DIAGNOSIS — F17200 Nicotine dependence, unspecified, uncomplicated: Secondary | ICD-10-CM | POA: Diagnosis not present

## 2013-04-03 DIAGNOSIS — S8990XA Unspecified injury of unspecified lower leg, initial encounter: Secondary | ICD-10-CM | POA: Insufficient documentation

## 2013-04-03 DIAGNOSIS — Y92410 Unspecified street and highway as the place of occurrence of the external cause: Secondary | ICD-10-CM | POA: Insufficient documentation

## 2013-04-03 DIAGNOSIS — M79605 Pain in left leg: Secondary | ICD-10-CM

## 2013-04-03 DIAGNOSIS — Y9239 Other specified sports and athletic area as the place of occurrence of the external cause: Secondary | ICD-10-CM | POA: Insufficient documentation

## 2013-04-03 MED ORDER — METHOCARBAMOL 500 MG PO TABS: 500.0000 mg | ORAL_TABLET | Freq: Two times a day (BID) | ORAL | Status: DC

## 2013-04-03 MED ORDER — NAPROXEN 500 MG PO TABS: 500.0000 mg | ORAL_TABLET | Freq: Two times a day (BID) | ORAL | Status: DC
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
## 2013-04-03 NOTE — ED Notes (Signed)
Did not answer x 3

## 2013-04-03 NOTE — ED Notes (Signed)
Sherita, EMT called for pt and pt was not in the main ED waiting area
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
## 2013-04-03 NOTE — ED Notes (Signed)
Unable to locate pt x2

## 2013-04-04 NOTE — ED Provider Notes (Signed)
Medical screening examination/treatment/procedure(s) were performed by non-physician practitioner and as supervising physician I was immediately available for consultation/collaboration.    Nelia Shi, MD 04/04/13 [phone]

## 2014-03-03 ENCOUNTER — Encounter (HOSPITAL_COMMUNITY): Payer: Self-pay | Admitting: Emergency Medicine

## 2014-03-03 ENCOUNTER — Emergency Department (HOSPITAL_COMMUNITY): Payer: Medicare Other

## 2014-03-03 ENCOUNTER — Emergency Department (HOSPITAL_COMMUNITY)
Admission: EM | Admit: 2014-03-03 | Discharge: 2014-03-03 | Disposition: A | Discharge status: Home / Self Care | Payer: Medicare Other | Attending: Emergency Medicine | Admitting: Emergency Medicine

## 2014-03-03 DIAGNOSIS — R0602 Shortness of breath: Secondary | ICD-10-CM | POA: Insufficient documentation

## 2014-03-03 DIAGNOSIS — F17200 Nicotine dependence, unspecified, uncomplicated: Secondary | ICD-10-CM | POA: Diagnosis not present

## 2014-03-03 DIAGNOSIS — Z8709 Personal history of other diseases of the respiratory system: Secondary | ICD-10-CM

## 2014-03-03 DIAGNOSIS — J069 Acute upper respiratory infection, unspecified: Secondary | ICD-10-CM | POA: Diagnosis not present

## 2014-03-03 DIAGNOSIS — J45901 Unspecified asthma with (acute) exacerbation: Secondary | ICD-10-CM | POA: Insufficient documentation

## 2014-03-03 HISTORY — DX: Unspecified asthma, uncomplicated: J45.909

## 2014-03-03 MED ORDER — IPRATROPIUM-ALBUTEROL 0.5-2.5 (3) MG/3ML IN SOLN
3.0000 mL | Freq: Once | RESPIRATORY_TRACT | Status: AC
  Administered 2014-03-03: 3 mL via RESPIRATORY_TRACT
  Filled 2014-03-03: qty 3

## 2014-03-03 MED ORDER — ALBUTEROL SULFATE HFA 108 (90 BASE) MCG/ACT IN AERS: 1.0000 | INHALATION_SPRAY | Freq: Four times a day (QID) | RESPIRATORY_TRACT | Status: DC | PRN

## 2014-03-03 MED ORDER — GUAIFENESIN 100 MG/5ML PO LIQD: 100.0000 mg | ORAL | Status: DC | PRN

## 2014-03-03 MED ORDER — DEXAMETHASONE SODIUM PHOSPHATE 10 MG/ML IJ SOLN
10.0000 mg | Freq: Once | INTRAMUSCULAR | Status: AC
  Administered 2014-03-03: 10 mg via INTRAMUSCULAR
  Filled 2014-03-03: qty 1

## 2014-03-03 NOTE — ED Notes (Signed)
Pt c/o shortness of breath x 3 days. Pt reports non productive cough and history of asthma.

## 2014-03-03 NOTE — Progress Notes (Signed)
Called to administer neb tx for SOB. Upon arrival, pt appearing SOB, hard time completing full sentences. Clear bbs, vitals within normal range. Neb tx appears to help pt. When talking with him post tx, he doesn't seem to be as SOB. RT will continue to monitor.

## 2014-03-03 NOTE — ED Provider Notes (Signed)
CSN: [id number]     Arrival date & time 03/03/14  1742 History   First MD Initiated Contact with Patient 03/03/14 1750     Chief Complaint  Patient presents with  . Shortness of Breath     (Consider location/radiation/quality/duration/timing/severity/associated sxs/prior Treatment) HPI Comments: The patient is a 28 year old male with past medical history of asthma presents emergency room chief complaint shortness of breath ongoing for 3 days. The patient reports similar to previous asthma attacks in the past. He reports taking his albuterol inhaler without relief last night, currently out of asthma medication. He reports productive cough and rhinorrhea. He denies chest pain.   Patient is a 28 y.o. male presenting with shortness of breath. The history is provided by the patient. No language interpreter was used.  Shortness of Breath Associated symptoms: cough   Associated symptoms: no fever     Past Medical History  Diagnosis Date  . Seasonal allergies   . Asthma    Past Surgical History  Procedure Laterality Date  . Dental surgery     No family history on file. History  Substance Use Topics  . Smoking status: Current Every Day Smoker -- 1.00 packs/day    Types: Cigarettes  . Smokeless tobacco: Not on file  . Alcohol Use: No     Comment: stopped x 3 weeks    Review of Systems  Constitutional: Negative for fever and chills.  HENT: Positive for rhinorrhea.   Respiratory: Positive for cough and shortness of breath.       Allergies  Shellfish-derived products  Home Medications   Prior to Admission medications   Medication Sig Start Date End Date Taking? Authorizing Provider  albuterol (PROVENTIL HFA;VENTOLIN HFA) 108 (90 BASE) MCG/ACT inhaler Inhale 2 puffs into the lungs every 6 (six) hours as needed for wheezing or shortness of breath.   Yes Historical Provider, MD   BP 119/75  Pulse 67  Temp(Src) 97.8 F (36.6 C) (Oral)  Resp 18  SpO2 100% Physical Exam   Nursing note and vitals reviewed. Constitutional: He appears well-developed and well-nourished. No distress.  Eyes: EOM are normal.  Neck: Neck supple.  Cardiovascular: Normal rate and regular rhythm.   No lower extremity edema  Pulmonary/Chest: Effort normal and breath sounds normal. No respiratory distress. He has no wheezes. He has no rales.  Speaking is short 4-5 word sentences.  Abdominal: Soft. There is no tenderness.  Neurological: He is alert.  Skin: Skin is warm and dry. He is not diaphoretic.  Psychiatric: He has a normal mood and affect. His behavior is normal.    ED Course  Procedures (including critical care time) Labs Review Labs Reviewed - No data to display  Imaging Review Dg Chest 2 View (if Patient Has Fever And/or Copd)  03/03/2014   CLINICAL DATA:  Cough for 2 weeks with shortness of breath for 3 days  EXAM: CHEST  2 VIEW  COMPARISON:  11/27/2011  FINDINGS: The heart size and mediastinal contours are within normal limits. Both lungs are clear. The visualized skeletal structures are unremarkable.  IMPRESSION: No active cardiopulmonary disease.   Electronically Signed   By: Esperanza Heir M.D.   On: 03/03/2014 18:22     EKG Interpretation None      MDM   Final diagnoses:  History of asthma  URI (upper respiratory infection)   Patient presents with dyspnea, no wheezing heard on exam. However per RN protocol the patient started on nebulizer treatment. Plan to order chest x-ray  for further evaluation of shortness of breath. Decadron ordered. X-ray without acute findings. Reevaluation patient reports resolution of symptoms, speaking in full sentences. No wheezing on repeat ascultation. Pt Requesting to be discharged home. Discussed imaging results, and treatment plan with the patient. Return precautions given. Reports understanding and no other concerns at this time.  Patient is stable for discharge at this time.   Meds given in ED:  Medications   ipratropium-albuterol (DUONEB) 0.5-2.5 (3) MG/3ML nebulizer solution 3 mL (3 mLs Nebulization Given 03/03/14 1759)  dexamethasone (DECADRON) injection 10 mg (10 mg Intramuscular Given 03/03/14 1844)    Discharge Medication List as of 03/03/2014  7:21 PM    START taking these medications   Details  !! albuterol (PROVENTIL HFA;VENTOLIN HFA) 108 (90 BASE) MCG/ACT inhaler Inhale 1 puff into the lungs every 6 (six) hours as needed for wheezing or shortness of breath., Starting 03/03/2014, Until Discontinued, Print    guaiFENesin (ROBITUSSIN) 100 MG/5ML liquid Take 5-10 mLs (100-200 mg total) by mouth every 4 (four) hours as needed for cough., Starting 03/03/2014, Until Discontinued, Print     !! - Potential duplicate medications found. Please discuss with provider.        Mellody DrownLauren Dodger Sinning, PA-C 03/04/14 [phone]

## 2014-03-03 NOTE — Discharge Instructions (Signed)
Call for a follow up appointment with a Family or Primary Care Provider.  °Return if Symptoms worsen.   °Take medication as prescribed.  ° ° °Emergency Department Resource Guide °1) Find a Doctor and Pay Out of Pocket °Although you won't have to find out who is covered by your insurance plan, it is a good idea to ask around and get recommendations. You will then need to call the office and see if the doctor you have chosen will accept you as a new patient and what types of options they offer for patients who are self-pay. Some doctors offer discounts or will set up payment plans for their patients who do not have insurance, but you will need to ask so you aren't surprised when you get to your appointment. ° °2) Contact Your Local Health Department °Not all health departments have doctors that can see patients for sick visits, but many do, so it is worth a call to see if yours does. If you don't know where your local health department is, you can check in your phone book. The CDC also has a tool to help you locate your state's health department, and many state websites also have listings of all of their local health departments. ° °3) Find a Walk-in Clinic °If your illness is not likely to be very severe or complicated, you may want to try a walk in clinic. These are popping up all over the country in pharmacies, drugstores, and shopping centers. They're usually staffed by nurse practitioners or physician assistants that have been trained to treat common illnesses and complaints. They're usually fairly quick and inexpensive. However, if you have serious medical issues or chronic medical problems, these are probably not your best option. ° °No Primary Care Doctor: °- Call [facility] at  832-8000 - they can help you locate a primary care doctor that  accepts your insurance, provides certain services, etc. °- Physician Referral Service- 1-800-533-3463 ° °Chronic Pain Problems: °Organization         Address  Phone    Notes  °[facility]  (336) 297-2271 Patients need to be referred by their primary care doctor.  ° °Medication Assistance: °Organization         Address  Phone   Notes  °[place] [facility] [address] °[place], [place] [address] (336) 641-8030 --Must be a resident of [place] °-- Must have NO insurance coverage whatsoever (no Medicaid/ Medicare, etc.) °-- The pt. MUST have a primary care doctor that directs their care regularly and follows them in the community °  °[facility]  (866) 331-1348   °[employer]  (888) 892-1162   ° °Agencies that provide inexpensive medical care: °Organization         Address  Phone   Notes  ° [facility]  (336) 832-8035   °[facility]    (336) 832-7272   °[facility] [address] °[place], [place] [address] (336) 832-4777   °[facility] [address], °[place] (336) 271-4999   °[facility]    (336) 373-0678   °[facility]    (336) 272-1050   °[facility] ° [address], [place] Phone:  (336) 832-4444, Fax:  (336) 832-4440 Hours of Operation:  9 am - 6 pm, M-F.  Also accepts Medicaid/Medicare and self-pay.  °[facility] ° [address],    Phone: (336) 832-3150, Fax: (336) 832-3151. Hours of Operation:  8:30 am - 5:30 pm, M-F.  Also accepts Medicaid and self-pay.  °[facility] [place] [address], [place] Phone: (336) 878-6027   °[facility] [address], [place], [place] (336)723-1848, Ext. 123 Mondays & Thursdays: 7-9 AM.  First 15 patients are seen on a first come, first serve basis. °  ° °Medicaid-accepting [place] Providers: ° °Organization         Address  Phone   Notes  °[facility] [address], [place] (336) 641-2100 Also accepts self-pay patients.  °[facility]  [address], [place] ° (336) 856-9996   °[facility] [address], [place] (336) 288-8857   °[facility] [address] [place] [address], [place] (336) 299-7000   °Veita Bland [address], [place]  ° (336) 373-1557 Only accepts [place] Access Medicaid patients after they have their name applied to their card.  ° °Self-Pay (no insurance) in [place]: ° °Organization         Address  Phone   Notes  °Sickle Cell Patients, [facility] [address], [place] (336) 832-1970   °[facility] [address], [place] (336) 832-4400   °[facility] ° [address], [place] (336) 992-4800   °[facility]/Dr. Osei-Bonsu ° [address] [place] [address], [place] or [address], [place] (336) 841-8500 Phone number for both [place] and [place] locations is the same.  °[facility] [address], [place] (336) 299-0000   °[facility] [address] [place] [address], [place] or [address] (336) 852-7530 °(336) 878-2260   °[facility] [address], [place] (336) 350-1642, phone; (336) 294-5005, fax Sees patients 1st and 3rd Saturday of every month.  Must not qualify for public or private insurance (i.e. Medicaid, Medicare, [place] Health Choice, Veterans' Benefits) • Household income should be no more than 200% of the poverty level •The clinic cannot treat you if you are pregnant or think you are pregnant • Sexually transmitted diseases are not treated at the clinic.  ° ° °Dental Care: °Organization         Address  Phone  Notes  °[facility] [facility] [address], [place] (336) 641-6152 Accepts children up to age 21 who are enrolled in Medicaid or [place] Health Choice; pregnant women with a Medicaid card; and children who have  applied for Medicaid or [place] Health Choice, but were declined, whose parents can pay a reduced fee at time of service.  °[facility] [place]  [address], [place] (336) 641-7733 Accepts children up to age 21 who are enrolled in Medicaid or [place] Health Choice; pregnant women with a Medicaid card; and children who have applied for Medicaid or  Health Choice, but were declined, whose parents can pay a reduced fee at time of service.  °[facility] ° [address], [place] (336) 641-4533 Patients are seen by appointment only. Walk-ins are not accepted. [facility] will see patients 18 years of age and older. °Monday - Tuesday (8am-5pm) °Most Wednesdays (8:30-5pm) °$30 per visit, cash only  °[facility] ° [address], [place] (336) 641-4533 Patients are seen by appointment only. Walk-ins are not accepted. [facility] will see patients 18 years of age and older. °One Wednesday Evening (Monthly: Volunteer Based).  $30 per visit, cash only  °[facility]  (919) 537-3737 for adults; Children under age 4, call Graduate Pediatric Dentistry at (919) [phone]. Children aged 4-14, please call (919) [phone] to request a pediatric application. ° Dental services are provided in all areas of dental care including fillings, crowns and bridges, complete and partial dentures, implants, gum treatment, root canals, and extractions. Preventive care is also provided. Treatment is provided to both adults and children. °Patients are selected via a lottery and there is often a waiting list. °  °[facility] [address], °[place] ° (336) 763-8833 [email or web address] °  °[facility] [address], [place], [place] (336)723-1848, Ext. 123 Second and Fourth Thursday of each month, opens at 6:30 AM; Clinic ends at 9 AM.  Patients are seen on a first-come first-served basis, and a  limited number are seen during each clinic.  ° °[facility] ° [address], [place], [place] (336) 723-7904   Eligibility Requirements °You must have lived in [place], [place], or [place] counties for at least the last three months. °  You cannot be eligible for state or federal sponsored healthcare insurance, including Veterans Administration, Medicaid, or Medicare. °  You generally cannot be eligible for healthcare insurance through your employer.  °  How to apply: °Eligibility screenings are held every Tuesday and Wednesday afternoon from 1:00 pm until 4:00 pm. You do not need an appointment for the interview!  °[facility] [address], [place], [place] 336-631-2330   °[facility]  336-342-8273   °[facility]  336-703-3100   °[facility]  336-570-6415   ° °Behavioral Health Resources in the Community: °Intensive Outpatient Programs °Organization         Address  Phone  Notes  °[facility] [address], [place], [place] 336-878-6098   °[facility] [address], [place], [place] 336-832-9800   °[facility] [address], [place], [place] ° 336-882-2125   °[facility] [address],  °[place], [place] 1-800-853-5163 or 336-641-4981   °Substance Abuse Resources °Organization         Address  Phone  Notes  °[facility]  336-882-2125   °[facility]  336-784-9470   °[facility]  336-285-9073   °[facility]  336-845-3988   °Residential & Outpatient Substance Abuse Program  1-800-659-3381   °Psychological Services °Organization         Address  Phone  Notes  °[facility]  336- 832-9600   °[facility]  336- 378-7881   °[facility] [address], [place] 1-800-853-5163 or 336-641-4981   ° °Mobile Crisis Teams °Organization          Address  Phone  Notes  °[facility], Mobile Crisis Care Unit  1-877-626-1772   °Assertive °[facility] ° [address] [place], [place] 336-834-9664   °Sharon DeEsch [address] °[place] 336-554-5454   ° °Self-Help/Support Groups °Organization         Address  Phone             Notes  °[facility]  - variety of   support groups  336- 373-1402 Call for more information  °Narcotics Anonymous (NA), [facility] [address], °[place]  2 meetings at this location  ° °Residential Treatment Programs °Organization         Address  Phone  Notes  °[facility] [address],    °[place]  1-866-801-8205   °[facility] ° [address], [place], [place] 704-293-8524   °[facility] [address], [place] 336-845-3988 Admissions: 8am-3pm M-F  °[facility] [address]    °[place], [place] 336-841-1104   °[facility] [address], [place], [place] 336-379-7146   °[facility] [address]  °[place], [place] 336-285-9073   °[facility] - Intensive Outpatient [address], [place], [place] 336-852-3033   °[facility] [address]  °[place], [place] 1-877-615-2722 or 336-784-9470   °[facility] [address] [place], [place] 336-227-7417 Accepts Medicaid  °[facility] [address]  °[place] 1-800-659-3381 Substance Abuse/Addiction Treatment  ° °[place] Behavioral Health Resources °Organization         Address  Phone  Notes  °[facility]  (888) 581-9988   °Julie Brannon, PhD [address] [place], [place]   (336) 349-5553 or (336) 951-0000   °[facility]   [address] °[place], [place] (336) 349-4454   °[facility] [address], [place], [place] (336) 342-8316 Insurance/Medicaid/sponsorship  through [facility]  °[facility] [address]                                    [place], [place] (336) 342-8316 Therapy/tele-psych/case  °[facility] [address]  ° [place], [place] (336) 349-2233    °Dr. Arfeen  (336) 349-4544   °[facility] [place]  [employer] [place] [facility] 1) [address],  °2) [address], [place] °3)  [address], [place] (336) 349-3220 °(336) 342-7768 ° °(336) 342-8140   °[place] [facility] (336) 342-1394 or (336) 342-3537 (After Hours)    ° °

## 2014-03-03 NOTE — ED Notes (Signed)
Pt returned from xray

## 2014-03-04 NOTE — ED Provider Notes (Signed)
Medical screening examination/treatment/procedure(s) were conducted as a shared visit with non-physician practitioner(s) and myself.  I personally evaluated the patient during the encounter.   EKG Interpretation None      I interviewed and examined the patient. Lungs are CTAB. Cardiac exam wnl. Abdomen soft.  Do not think this is an asthma exac, but pt did have improvement w/ alb. Will give a dose of decradon and return precautions.   Purvis SheffieldForrest Nandi Tonnesen, MD 03/04/14 1240

## 2014-03-06 ENCOUNTER — Emergency Department (HOSPITAL_COMMUNITY)
Admission: EM | Admit: 2014-03-06 | Discharge: 2014-03-07 | Disposition: A | Discharge status: Home / Self Care | Payer: Medicare Other | Attending: Emergency Medicine | Admitting: Emergency Medicine

## 2014-03-06 ENCOUNTER — Encounter (HOSPITAL_COMMUNITY): Payer: Self-pay | Admitting: Emergency Medicine

## 2014-03-06 ENCOUNTER — Emergency Department (HOSPITAL_COMMUNITY)
Admission: EM | Admit: 2014-03-06 | Discharge: 2014-03-06 | Discharge status: Against Medical Advice | Payer: Medicare Other | Source: Home / Self Care

## 2014-03-06 ENCOUNTER — Emergency Department (HOSPITAL_COMMUNITY): Payer: Medicare Other

## 2014-03-06 DIAGNOSIS — R0602 Shortness of breath: Secondary | ICD-10-CM | POA: Insufficient documentation

## 2014-03-06 DIAGNOSIS — F17200 Nicotine dependence, unspecified, uncomplicated: Secondary | ICD-10-CM | POA: Diagnosis not present

## 2014-03-06 DIAGNOSIS — J45909 Unspecified asthma, uncomplicated: Secondary | ICD-10-CM

## 2014-03-06 DIAGNOSIS — R209 Unspecified disturbances of skin sensation: Secondary | ICD-10-CM

## 2014-03-06 DIAGNOSIS — J45901 Unspecified asthma with (acute) exacerbation: Secondary | ICD-10-CM | POA: Diagnosis not present

## 2014-03-06 LAB — BASIC METABOLIC PANEL
Anion gap: 15 (ref 5–15)
BUN: 8 mg/dL (ref 6–23)
CO2: 21 meq/L (ref 19–32)
Calcium: 9 mg/dL (ref 8.4–10.5)
Chloride: 105 mEq/L (ref 96–112)
Creatinine, Ser: 0.75 mg/dL (ref 0.50–1.35)
GFR calc Af Amer: >90 mL/min (ref >90)
GLUCOSE: 92 mg/dL (ref 70–99)
POTASSIUM: 3.8 meq/L (ref 3.7–5.3)
Sodium: 141 mEq/L (ref 137–147)

## 2014-03-06 LAB — I-STAT TROPONIN, ED: Troponin i, poc: 0 ng/mL (ref 0.00–0.08)

## 2014-03-06 LAB — CBC
HEMATOCRIT: 42.5 % (ref 39.0–52.0)
HEMOGLOBIN: 15.3 g/dL (ref 13.0–17.0)
MCH: 29.4 pg (ref 26.0–34.0)
MCHC: 36 g/dL (ref 30.0–36.0)
MCV: 81.6 fL (ref 78.0–100.0)
Platelets: 248 10*3/uL (ref 150–400)
RBC: 5.21 MIL/uL (ref 4.22–5.81)
RDW: 13.3 % (ref 11.5–15.5)
WBC: 8.2 10*3/uL (ref 4.0–10.5)

## 2014-03-06 MED ORDER — IPRATROPIUM-ALBUTEROL 0.5-2.5 (3) MG/3ML IN SOLN
3.0000 mL | Freq: Once | RESPIRATORY_TRACT | Status: AC
  Administered 2014-03-06: 3 mL via RESPIRATORY_TRACT
  Filled 2014-03-06: qty 3

## 2014-03-06 NOTE — ED Notes (Signed)
Pt. reports SOB with occasional dry cough onset today , seen here this afternoon but left to take his child and spouse home , x-ray /ekg and blood tests done this afternoon .

## 2014-03-06 NOTE — ED Provider Notes (Signed)
CSN: [id number]     Arrival date & time 03/06/14  2010 History  This chart was scribed for non-physician practitioner, Raymon MuttonMarissa Shelvy Heckert, PA-C,working with Raeford RazorStephen Kohut, MD, by Karle PlumberJennifer Tensley, ED Scribe. This patient was seen in room TR09C/TR09C and the patient's care was started at 11:22 PM.  No chief complaint on file.  The history is provided by the patient. No language interpreter was used.   HPI Comments:  Kenneth Lane is a 28 y.o. male with h/o asthma who presents to the Emergency Department complaining of SOB onset earlier today. He reports associated dry cough. Pt presented here three days ago and received a negative CXR and was prescribed an inhaler. He reports using his inhaler earlier today without relief. He denies fever, chills, neck pain, neck stiffness, difficulty swallowing, throat closing sensation, HA, eye pain, otalgia or CP. He states he smokes 3-4 cigarettes daily. He denies h/o DM.  Past Medical History  Diagnosis Date  . Seasonal allergies   . Asthma    Past Surgical History  Procedure Laterality Date  . Dental surgery     No family history on file. History  Substance Use Topics  . Smoking status: Current Every Day Smoker -- 1.00 packs/day    Types: Cigarettes  . Smokeless tobacco: Not on file  . Alcohol Use: Yes     Comment: stopped x 3 weeks    Review of Systems  Constitutional: Negative for fever and chills.  HENT: Negative for ear pain.   Eyes: Negative for pain.  Respiratory: Positive for cough (Dry) and shortness of breath. Negative for chest tightness.   Cardiovascular: Negative for chest pain.  Neurological: Negative for dizziness, weakness and headaches.    Allergies  Shellfish-derived products  Home Medications   Prior to Admission medications   Medication Sig Start Date End Date Taking? Authorizing Provider  albuterol (PROVENTIL HFA;VENTOLIN HFA) 108 (90 BASE) MCG/ACT inhaler Inhale 2 puffs into the lungs every 6 (six) hours as needed  for wheezing or shortness of breath.    Historical Provider, MD  albuterol (PROVENTIL HFA;VENTOLIN HFA) 108 (90 BASE) MCG/ACT inhaler Inhale 1 puff into the lungs every 6 (six) hours as needed for wheezing or shortness of breath. 03/03/14   Mellody DrownLauren Parker, PA-C  guaiFENesin (ROBITUSSIN) 100 MG/5ML liquid Take 5-10 mLs (100-200 mg total) by mouth every 4 (four) hours as needed for cough. 03/03/14   Mellody DrownLauren Parker, PA-C  predniSONE (DELTASONE) 20 MG tablet 3 tabs po day one, then 2 tabs po day two and three, then 1 tab po day four, then half a tab po day five. 03/07/14   [name], PA-C   Triage Vitals: BP 149/63  Pulse 71  Temp(Src) 97.9 F (36.6 C) (Oral)  Resp 22  Ht 5\' 5"  (1.651 m)  Wt 136 lb (61.689 kg)  BMI 22.63 kg/m2  SpO2 98% Physical Exam  Nursing note and vitals reviewed. Constitutional: He is oriented to person, place, and time. He appears well-developed and well-nourished.  Patient found sitting comfortably upright in chair with no signs of respiratory distress  HENT:  Head: Normocephalic and atraumatic.  Mouth/Throat: Oropharynx is clear and moist. No oropharyngeal exudate.  Eyes: Conjunctivae and EOM are normal. Pupils are equal, round, and reactive to light. Right eye exhibits no discharge. Left eye exhibits no discharge.  Neck: Normal range of motion. Neck supple. No tracheal deviation present.  Cardiovascular: Normal rate, regular rhythm and normal heart sounds.  Exam reveals no friction rub.   No  murmur heard. Cap refill < 3 seconds  Pulmonary/Chest: Effort normal. No respiratory distress. He has wheezes in the right upper field, the right lower field, the left upper field and the left lower field. He has no rales. He exhibits no tenderness.  Patient is able to speak in full sentences without difficulty Negative use of accessory muscles Negative stridor Negative abdominal retractions  Expiratory respiratory wheezes identified  Musculoskeletal: Normal range of motion.   Lymphadenopathy:    He has no cervical adenopathy.  Neurological: He is alert and oriented to person, place, and time.  Skin: Skin is warm and dry.  Psychiatric: He has a normal mood and affect. His behavior is normal.    ED Course  Procedures (including critical care time) DIAGNOSTIC STUDIES: Oxygen Saturation is 98% on RA, normal by my interpretation.   COORDINATION OF CARE: 11:28 PM- Will administer a breathing treatment. Pt verbalizes understanding and agrees to plan.  Medications  ipratropium-albuterol (DUONEB) 0.5-2.5 (3) MG/3ML nebulizer solution 3 mL (3 mLs Nebulization Given 03/06/14 2343)   12:02 AM Patient ambulated well without signs of respiratory distress. Pulse ox 98% on room air. Patient denied chest pain, shortness of breath, difficulty breathing.   12:18 AM This provider reassessed the patient. Very minimal expiratory wheezes identified to the upper lobes bilaterally - improvement noted. Patient appears comfortable with no sign of respiratory distress.  Labs Review Labs Reviewed - No data to display  Imaging Review Dg Chest 2 View  03/06/2014   CLINICAL DATA:  Shortness of breath and palpitations.  Cough 5 days.  EXAM: CHEST  2 VIEW  COMPARISON:  03/03/2014.  FINDINGS: Lungs are adequately inflated without consolidation or effusion. Cardiomediastinal silhouette and remainder the exam is unchanged.  IMPRESSION: No active cardiopulmonary disease.   Electronically Signed   By: Elberta Fortis M.D.   On: 03/06/2014 17:33     EKG Interpretation None      MDM   Final diagnoses:  Asthma, unspecified asthma severity, uncomplicated    Medications  ipratropium-albuterol (DUONEB) 0.5-2.5 (3) MG/3ML nebulizer solution 3 mL (3 mLs Nebulization Given 03/06/14 2343)   Filed Vitals:   03/06/14 2015 03/06/14 2311 03/07/14 0024  BP: 149/63 120/63 106/66  Pulse: 71 67 60  Temp: 97.9 F (36.6 C)  97.6 F (36.4 C)  TempSrc: Oral  Oral  Resp: 22 20 18   Height: 5\' 5"   (1.651 m)    Weight: 136 lb (61.689 kg)    SpO2: 98% 100% 98%    I personally performed the services described in this documentation, which was scribed in my presence. The recorded information has been reviewed and is accurate.  This provider reviewed patient's chart. Patient was seen and assessed on 03/03/2014 regarding asthma - chest x-ray performed with negative acute abnormalities noted. Patient given nebulizer treatment and IM injection of Decadron with positive relief. Patient was discharged home with inhaler. Patient presented back to the emergency department earlier today where a chest x-ray was performed that was unremarkable, basic labs performed with negative findings. EKG unremarkable. Patient presented to the ED with wheezing. Albuterol-ipratropium nebulizer treatment administered with relief. Negative shortness of breath identified. Lungs clear with minimal wheezes identified to the upper lobes bilaterally - improvement noted. Negative signs of respiratory distress. Negative stridor. Patient ambulated well with pulse ox 98% -  denied chest pain, shortness of breath, difficulty breathing. PERC negative. Patient stable, afebrile. Patient not septic appearing. Negative hypoxia. Discharged patient. Suspicion to be asthma exacerbation. Patient has albuterol inhaler  at home. Discharge patient with prednisone. [facility].  Discussed with patient to closely monitor symptoms and if symptoms are to worsen or change to report back to the ED - strict return instructions given.  Patient agreed to plan of care, understood, all questions answered.   Raymon Mutton, PA-C 03/07/14 1400

## 2014-03-06 NOTE — ED Notes (Signed)
Pt sts woke up from nap c/o palpitations, generalized numbness and jaw pain today; pt denies palpitations at present; pt sts some SOB

## 2014-03-07 MED ORDER — PREDNISONE 20 MG PO TABS: ORAL_TABLET | ORAL | Status: DC

## 2014-03-07 NOTE — ED Notes (Signed)
Pt. Ambulated with steady gait, no SOB. 98% O2 and HR 60.

## 2014-03-07 NOTE — Discharge Instructions (Signed)
Please call your doctor for a followup appointment within 24-48 hours. When you talk to your doctor please let them know that you were seen in the emergency department and have them acquire all of your records so that they can discuss the findings with you and formulate a treatment plan to fully care for your new and ongoing problems. Please use albuterol inhaler as needed for shortness of breath Please use prednisone as prescribed Please rest and stay hydrated Please avoid any physical or strenuous activity Please continue to monitor symptoms closely if symptoms are to worsen or change (fever greater than 101, chest pain, shortness of breath, difficulty breathing, worsening or changes to cough, neck pain, neck stiffness, dizziness, headaches, asthma attack) please report back to the ED immediately   Asthma Asthma is a recurring condition in which the airways tighten and narrow. Asthma can make it difficult to breathe. It can cause coughing, wheezing, and shortness of breath. Asthma episodes, also called asthma attacks, range from minor to life-threatening. Asthma cannot be cured, but medicines and lifestyle changes can help control it. CAUSES Asthma is believed to be caused by inherited (genetic) and environmental factors, but its exact cause is unknown. Asthma may be triggered by allergens, lung infections, or irritants in the air. Asthma triggers are different for each person. Common triggers include:   Animal dander.  Dust mites.  Cockroaches.  Pollen from trees or grass.  Mold.  Smoke.  Air pollutants such as dust, household cleaners, hair sprays, aerosol sprays, paint fumes, strong chemicals, or strong odors.  Cold air, weather changes, and winds (which increase molds and pollens in the air).  Strong emotional expressions such as crying or laughing hard.  Stress.  Certain medicines (such as aspirin) or types of drugs (such as beta-blockers).  Sulfites in foods and drinks.  Foods and drinks that may contain sulfites include dried fruit, potato chips, and sparkling grape juice.  Infections or inflammatory conditions such as the flu, a cold, or an inflammation of the nasal membranes (rhinitis).  Gastroesophageal reflux disease (GERD).  Exercise or strenuous activity. SYMPTOMS Symptoms may occur immediately after asthma is triggered or many hours later. Symptoms include:  Wheezing.  Excessive nighttime or early morning coughing.  Frequent or severe coughing with a common cold.  Chest tightness.  Shortness of breath. DIAGNOSIS  The diagnosis of asthma is made by a review of your medical history and a physical exam. Tests may also be performed. These may include:  Lung function studies. These tests show how much air you breathe in and out.  Allergy tests.  Imaging tests such as X-rays. TREATMENT  Asthma cannot be cured, but it can usually be controlled. Treatment involves identifying and avoiding your asthma triggers. It also involves medicines. There are 2 classes of medicine used for asthma treatment:   Controller medicines. These prevent asthma symptoms from occurring. They are usually taken every day.  Reliever or rescue medicines. These quickly relieve asthma symptoms. They are used as needed and provide short-term relief. Your health care provider will help you create an asthma action plan. An asthma action plan is a written plan for managing and treating your asthma attacks. It includes a list of your asthma triggers and how they may be avoided. It also includes information on when medicines should be taken and when their dosage should be changed. An action plan may also involve the use of a device called a peak flow meter. A peak flow meter measures how well the  lungs are working. It helps you monitor your condition. HOME CARE INSTRUCTIONS   Take medicines only as directed by your health care provider. Speak with your health care provider if you  have questions about how or when to take the medicines.  Use a peak flow meter as directed by your health care provider. Record and keep track of readings.  Understand and use the action plan to help minimize or stop an asthma attack without needing to seek medical care.  Control your home environment in the following ways to help prevent asthma attacks:  Do not smoke. Avoid being exposed to secondhand smoke.  Change your heating and air conditioning filter regularly.  Limit your use of fireplaces and wood stoves.  Get rid of pests (such as roaches and mice) and their droppings.  Throw away plants if you see mold on them.  Clean your floors and dust regularly. Use unscented cleaning products.  Try to have someone else vacuum for you regularly. Stay out of rooms while they are being vacuumed and for a short while afterward. If you vacuum, use a dust mask from a hardware store, a double-layered or microfilter vacuum cleaner bag, or a vacuum cleaner with a HEPA filter.  Replace carpet with wood, tile, or vinyl flooring. Carpet can trap dander and dust.  Use allergy-proof pillows, mattress covers, and box spring covers.  Wash bed sheets and blankets every week in hot water and dry them in a dryer.  Use blankets that are made of polyester or cotton.  Clean bathrooms and kitchens with bleach. If possible, have someone repaint the walls in these rooms with mold-resistant paint. Keep out of the rooms that are being cleaned and painted.  Wash hands frequently. SEEK MEDICAL CARE IF:   You have wheezing, shortness of breath, or a cough even if taking medicine to prevent attacks.  The colored mucus you cough up (sputum) is thicker than usual.  Your sputum changes from clear or white to yellow, green, gray, or bloody.  You have any problems that may be related to the medicines you are taking (such as a rash, itching, swelling, or trouble breathing).  You are using a reliever medicine  more than 2-3 times per week.  Your peak flow is still at 50-79% of your personal best after following your action plan for 1 hour.  You have a fever. SEEK IMMEDIATE MEDICAL CARE IF:   You seem to be getting worse and are unresponsive to treatment during an asthma attack.  You are short of breath even at rest.  You get short of breath when doing very little physical activity.  You have difficulty eating, drinking, or talking due to asthma symptoms.  You develop chest pain.  You develop a fast heartbeat.  You have a bluish color to your lips or fingernails.  You are light-headed, dizzy, or faint.  Your peak flow is less than 50% of your personal best. MAKE SURE YOU:   Understand these instructions.  Will watch your condition.  Will get help right away if you are not doing well or get worse. Document Released: 07/13/2005 Document Revised: 11/27/2013 Document Reviewed: 02/09/2013 [facility] Patient Information 2015 [place], [place]. This information is not intended to replace advice given to you by your health care provider. Make sure you discuss any questions you have with your health care provider.   Emergency Department Resource Guide 1) Find a Doctor and Pay Out of Pocket Although you won't have to find out who is  covered by your insurance plan, it is a good idea to ask around and get recommendations. You will then need to call the office and see if the doctor you have chosen will accept you as a new patient and what types of options they offer for patients who are self-pay. Some doctors offer discounts or will set up payment plans for their patients who do not have insurance, but you will need to ask so you aren't surprised when you get to your appointment.  2) Contact Your Local Health Department Not all health departments have doctors that can see patients for sick visits, but many do, so it is worth a call to see if yours does. If you don't know where your local health  department is, you can check in your phone book. The CDC also has a tool to help you locate your state's health department, and many state websites also have listings of all of their local health departments.  3) Find a Walk-in Clinic If your illness is not likely to be very severe or complicated, you may want to try a walk in clinic. These are popping up all over the country in pharmacies, drugstores, and shopping centers. They're usually staffed by nurse practitioners or physician assistants that have been trained to treat common illnesses and complaints. They're usually fairly quick and inexpensive. However, if you have serious medical issues or chronic medical problems, these are probably not your best option.  No Primary Care Doctor: - Call Health Connect at  [phone] - they can help you locate a primary care doctor that  accepts your insurance, provides certain services, etc. - Physician Referral Service- [phone]  Chronic Pain Problems: Organization         Address  Phone   Notes  [facility]  [phone] Patients need to be referred by their primary care doctor.   Medication Assistance: Organization         Address  Phone   Notes  [facility] [address] [place], [place] [address] [phone] --Must be a resident of [place] [facility] -- Must have NO insurance coverage whatsoever (no Medicaid/ Medicare, etc.) -- The pt. MUST have a primary care doctor that directs their care regularly and follows them in the community   Med[facility]ist  [phone]   [employer]  [phone]    Agencies that provide inexpensive medical care: Organization         Address  Phone   Notes  [facility]  [phone]   [facility]    [phone]   [facility] [address] [place], [place] [address] [phone]   [facility] [address], [place] [phone]   [facility]anned [facility]    [phone]   [facility]    [phone]   [facility]  [address], [place] Phone:  [phone], Fax:  [phone] Hours of Operation:  9 am - 6 pm, M-F.  Also accepts Medicaid/Medicare and self-pay.  [facility]  [address], [place] Phone: [phone], Fax: [phone]. Hours of Operation:  8:30 am - 5:30 pm, M-F.  Also accepts Medicaid and self-pay.  [facility] [place] [address], [place] Phone: [phone]   [facility] [address], [place], [place] [phone], Ext.  123 Mondays & Thursdays: 7-9 AM.  First 15 patients are seen on a first come, first serve basis.    Medicaid-accepting [facility] Providers:  Organization         Address  Phone   Notes  [facility] [address], [place] [phone] Also accepts self-pay patients.  [facility] [address] Laurell Josephs [place], [place]  [phone]   [facility] [address], [place] [phone]   [facility] [address] [place] [phone]   Renaye Rakers [address], [place]   [phone] Only accepts [place] Access [place] patients after they have their name applied to their card.   Self-Pay (no insurance) in [facility]:  Organization         Address  Phone   Notes  Sickle Cell Patients, [facility] [address] [place], [place] [phone]   [facility] [address] [place], [place] [phone]   [facility] [place]  [address], [place] [phone]   [facility]/Dr. Osei-Bonsu  [address] [place] or [address], [place] [phone] Phone number for both [place] and  [place] locations is the same.  [facility] [address], [place] [phone]   [facility] [address] [place] or [address] [phone]   [facility] [address], [place] [phone], phone; [phone], fax Sees patients 1st and 3rd Saturday of every month.  Must not qualify for public or private insurance (i.e. Medicaid, Medicare, [place] Health Choice, Veterans' Benefits)  Household income should be no more than 200% of the poverty level The clinic cannot treat you if you are pregnant or think you are pregnant  Sexually transmitted diseases are not treated at the clinic.    Dental Care: Organization         Address  Phone  Notes  [facility] of [facility] [address] [place], [place] [phone] Accepts children up to age 43 who are enrolled in [place] or [place] Health Choice; pregnant women with a Medicaid card; and children who have applied for Medicaid or [place] Health Choice, but were declined, whose parents can pay a reduced fee at time of service.  [facility] Department of [facility]  [address], [place] [phone] Accepts children up to age 20 who are enrolled in [place] or [place] Health Choice; pregnant women with a Medicaid card; and children who have applied for Medicaid or [place] Health Choice, but were declined, whose parents can pay a reduced fee at time of service.  [facility]  [address] [place], [place] [phone] Patients are seen by appointment only. Walk-ins are not accepted. [facility] will see patients 24 years of age and older. Monday - Tuesday (8am-5pm) Most Wednesdays (8:30-5pm) $30 per visit, cash only  [facility] Access [facility]  [address], [facility] [phone] Patients are seen by appointment only. Walk-ins are not accepted.  [facility] will see patients 108 years of age and older. One Wednesday Evening (Monthly: Volunteer Based).  $30 per visit, cash only  [employer]  [phone] for  adults; Children under age 22, call Graduate Pediatric Dentistry at [phone]. Children aged 32-14, please call [phone] to request a pediatric application.  Dental services are provided in all areas of dental care including fillings, crowns and bridges, complete and partial dentures, implants, gum treatment, root canals, and extractions. Preventive care is also provided. Treatment is provided to both adults and children. Patients are selected via a lottery and there is often a waiting list.   [facility] [address], [place]  [phone] [email or web address]   [facility] [address] [place], [place] [phone], Ext. [phone] Second and Fourth Thursday of each month, opens at 6:30 AM; Clinic ends at 9 AM.  Patients are seen on a first-come first-served basis, and a limited number are seen during each clinic.   [facility]  [address] Ether Griffins [place], [place] [phone]   Eligibility Requirements You must have lived in [place], [place], or [place] counties for at least the last three months.   You cannot be eligible for state or federal sponsored [place], including [employer], [place], or [employer].   You generally cannot be eligible for healthcare insurance through your employer.    How to apply: Eligibility screenings are held every Tuesday and Wednesday afternoon from 1:00 pm until 4:00 pm. You do not need an appointment for the interview!  [facility] [address], [place], [place] [phone]   [facility] Health Department  [phone]   [facility] Health Department  [phone]   [facility] Health Department  [phone]    Behavioral Health Resources in the  Community: Intensive Outpatient Programs Organization         Address  Phone  Notes  [facility] Services [address] [place], [place] [phone]   [facility] Outpatient [address], [place], [place] [phone]   [facility] [address] [place], [place]  [phone]   [facility] Mental Health [address],  [place], [place] [phone] or [phone]   Substance Abuse Resources Organization         Address  Phone  Notes  [facility]  [phone]   [facility]  [phone]   [facility]  [phone]   Floydene Flock  [phone]   Residential & Outpatient Substance Abuse Program  [phone]   Psychological Services Organization         Address  Phone  Notes  [facility] Health  [phone]   [facility] Services  [phone]   [facility] [address], [place] [phone] or [phone]    Mobile Crisis Teams Organization         Address  Phone  Notes  [facility], Mobile Crisis Care Unit  [phone]   [facility]  [address] [place], [place] [phone]   Doristine Locks [address] [place] [phone]    Self-Help/Support Groups Organization         Address  Phone             Notes  [facility] [place] - variety of support groups  [phone]- I7437963 Call for more information  Narcotics Anonymous (NA), [facility] [address], [employer] [place]  2 meetings at this location   Chief Executive Officer  Notes  [facility]  Treatment [address],    [place]  [phone]   [facility]  [address] [place] [address], [place], [place] [phone]   [facility] [address] [place], [place] [phone] Admissions: 8am-3pm M-F  [facility] [address],    [place], [place] [phone]   [facility] [address] [place], [place], [place] [phone]   [facility] [address]  [place], [place] [phone]   [facility] - Intensive Outpatient [address]urell [address], [place], [place] [phone]   [facility] ([facility] [address]  [place], [place] [phone] or [phone]   [facility] ([facility]) [address] [place], [place] [phone] Accepts Medicaid  [facility] [address]  [place] [phone] Substance Abuse/Addiction Treatment   [facility] Organization         Address  Phone  Notes  [facility]  [phone]   Angie Fava, PhD [address] Ervin Knack [place], [place]   [phone] or [phone]   [facility] Behavioral   [address] [place], [place] (561) [phone]   [facility] [address], [place], [place] [phone] Insurance/Medicaid/sponsorship through [facility] [address] [place] [address]                                    [place], [place] [phone] Therapy/tele-psych/case  [facility] [address], [place] [phone]    Dr. Lolly Mustache  [phone]   [facility]  [employer] [facility] Dept. 1) [address]  2) [address] [place] 3)  [address], [place] [phone]  [phone]   [facility] Child Abuse Hotline (916)705-7060 or [phone]) [phone] (After Hours)

## 2014-03-08 NOTE — ED Provider Notes (Signed)
Medical screening examination/treatment/procedure(s) were performed by non-physician practitioner and as supervising physician I was immediately available for consultation/collaboration.   EKG Interpretation None       Trayveon Beckford, MD 03/08/14 0704 

## 2015-02-14 ENCOUNTER — Encounter (HOSPITAL_COMMUNITY): Payer: Self-pay | Admitting: *Deleted

## 2015-02-14 DIAGNOSIS — J45909 Unspecified asthma, uncomplicated: Secondary | ICD-10-CM | POA: Insufficient documentation

## 2015-02-14 DIAGNOSIS — R9431 Abnormal electrocardiogram [ECG] [EKG]: Secondary | ICD-10-CM | POA: Diagnosis not present

## 2015-02-14 DIAGNOSIS — R200 Anesthesia of skin: Secondary | ICD-10-CM | POA: Insufficient documentation

## 2015-02-14 DIAGNOSIS — R112 Nausea with vomiting, unspecified: Secondary | ICD-10-CM | POA: Diagnosis present

## 2015-02-14 DIAGNOSIS — R079 Chest pain, unspecified: Secondary | ICD-10-CM | POA: Diagnosis not present

## 2015-02-14 DIAGNOSIS — Z720 Tobacco use: Secondary | ICD-10-CM | POA: Diagnosis not present

## 2015-02-14 DIAGNOSIS — K2970 Gastritis, unspecified, without bleeding: Secondary | ICD-10-CM | POA: Diagnosis not present

## 2015-02-14 LAB — COMPREHENSIVE METABOLIC PANEL
ALT: 24 U/L (ref 17–63)
AST: 23 U/L (ref 15–41)
Albumin: 4.1 g/dL (ref 3.5–5.0)
Alkaline Phosphatase: 62 U/L (ref 38–126)
Anion gap: 8 (ref 5–15)
BILIRUBIN TOTAL: 0.6 mg/dL (ref 0.3–1.2)
BUN: 10 mg/dL (ref 6–20)
CALCIUM: 9.2 mg/dL (ref 8.9–10.3)
CHLORIDE: 104 mmol/L (ref 101–111)
CO2: 25 mmol/L (ref 22–32)
Creatinine, Ser: 0.94 mg/dL (ref 0.61–1.24)
GFR calc Af Amer: >60 mL/min (ref >60)
GLUCOSE: 108 mg/dL — AB (ref 65–99)
POTASSIUM: 3.8 mmol/L (ref 3.5–5.1)
Sodium: 137 mmol/L (ref 135–145)
Total Protein: 6.7 g/dL (ref 6.5–8.1)

## 2015-02-14 LAB — CBC
HCT: 43.7 % (ref 39.0–52.0)
HEMOGLOBIN: 15.4 g/dL (ref 13.0–17.0)
MCH: 29.3 pg (ref 26.0–34.0)
MCHC: 35.2 g/dL (ref 30.0–36.0)
MCV: 83.1 fL (ref 78.0–100.0)
Platelets: 230 10*3/uL (ref 150–400)
RBC: 5.26 MIL/uL (ref 4.22–5.81)
RDW: 13.9 % (ref 11.5–15.5)
WBC: 7.5 10*3/uL (ref 4.0–10.5)

## 2015-02-14 LAB — LIPASE, BLOOD: Lipase: 15 U/L — ABNORMAL LOW (ref 22–51)

## 2015-02-14 MED ORDER — ONDANSETRON 4 MG PO TBDP
4.0000 mg | ORAL_TABLET | Freq: Once | ORAL | Status: AC | PRN
  Administered 2015-02-14: 4 mg via ORAL

## 2015-02-14 MED ORDER — ONDANSETRON 4 MG PO TBDP
ORAL_TABLET | ORAL | Status: AC
  Filled 2015-02-14: qty 1

## 2015-02-14 NOTE — ED Notes (Signed)
Pt c/o acid reflux and n/v since 1900 tonight. Pt reports emesis x 2. Pt denies diarrhea, fever, chills, shortness of breath

## 2015-02-15 ENCOUNTER — Emergency Department (HOSPITAL_COMMUNITY)
Admission: EM | Admit: 2015-02-15 | Discharge: 2015-02-15 | Disposition: A | Discharge status: Home / Self Care | Payer: Medicare Other | Attending: Emergency Medicine | Admitting: Emergency Medicine

## 2015-02-15 ENCOUNTER — Encounter (HOSPITAL_COMMUNITY): Payer: Self-pay | Admitting: Emergency Medicine

## 2015-02-15 DIAGNOSIS — K2970 Gastritis, unspecified, without bleeding: Secondary | ICD-10-CM

## 2015-02-15 DIAGNOSIS — R9431 Abnormal electrocardiogram [ECG] [EKG]: Secondary | ICD-10-CM

## 2015-02-15 HISTORY — DX: Bradycardia, unspecified: R00.1

## 2015-02-15 LAB — I-STAT TROPONIN, ED: Troponin i, poc: 0 ng/mL (ref 0.00–0.08)

## 2015-02-15 MED ORDER — ONDANSETRON 8 MG PO TBDP: 8.0000 mg | ORAL_TABLET | Freq: Three times a day (TID) | ORAL | Status: DC | PRN

## 2015-02-15 MED ORDER — FAMOTIDINE 20 MG PO TABS: 20.0000 mg | ORAL_TABLET | Freq: Two times a day (BID) | ORAL | Status: DC

## 2015-02-15 NOTE — ED Notes (Signed)
Pt reports that he was nauseated from heartburn earlier and threw up twice. PT states he feels completely normal after taking a zofran in triage. Pt given a gingerale to see if he can tolerate fluids.

## 2015-02-15 NOTE — ED Provider Notes (Signed)
CSN: [id number]     Arrival date & time 02/14/15  2221 History  This chart was scribed for Derwood Kaplan, MD by Tanda Rockers, ED Scribe. This patient was seen in room A09C/A09C and the patient's care was started at 1:36 AM.    Chief Complaint  Patient presents with  . Gastrophageal Reflux  . Emesis  . Nausea   The history is provided by the patient. No language interpreter was used.     HPI Comments: Kenneth Lane is a 29 y.o. male who presents to the Emergency Department complaining of sudden onset, diffuse chest pain that began earlier tonight around 7 PM (approximately 6.5 hours ago). He describes the pain as a tightness. He attributed the pain to acid reflux due to the pain beginning shortly after eating. Pt is currently chest pain free. Denies pain radiation. He states that his lips were numb when the chest pain was present. He also complains of nausea and vomiting. Pt reports intermittent chest pain with strenuous activity but states this pain feels different. No shortness of breath, diaphoresis, or any other associated symptoms. Pt has fhx of cardiac issues with mother who had MI at 63.  Pt is current everyday smoker who smokes 1/2 ppd. Denies illicit drug use.    ROS 10 Systems reviewed and are negative for acute change except as noted in the HPI.     Past Medical History  Diagnosis Date  . Seasonal allergies   . Asthma   . Bradycardia    Past Surgical History  Procedure Laterality Date  . Dental surgery     History reviewed. No pertinent family history. History  Substance Use Topics  . Smoking status: Current Every Day Smoker -- 1.00 packs/day    Types: Cigarettes  . Smokeless tobacco: Not on file  . Alcohol Use: Yes     Comment: stopped x 3 weeks    Review of Systems  Constitutional: Negative for fever and chills.  HENT: Negative for congestion and rhinorrhea.   Respiratory: Negative for shortness of breath.   Cardiovascular: Positive for chest pain.   Gastrointestinal: Positive for nausea and vomiting.  Musculoskeletal: Negative for arthralgias.  Skin: Negative for rash.  Neurological: Positive for numbness (To lips).   Allergies  Shellfish-derived products  Home Medications   Prior to Admission medications   Medication Sig Start Date End Date Taking? Authorizing Provider  famotidine (PEPCID) 20 MG tablet Take 1 tablet (20 mg total) by mouth 2 (two) times daily. 02/15/15   Derwood Kaplan, MD  ondansetron (ZOFRAN ODT) 8 MG disintegrating tablet Take 1 tablet (8 mg total) by mouth every 8 (eight) hours as needed for nausea. 02/15/15   Derwood Kaplan, MD   Triage Vitals: BP 121/60 mmHg  Pulse 72  Temp(Src) 97.7 F (36.5 C) (Oral)  Resp 18  Ht 5\' 5"  (1.651 m)  Wt 145 lb (65.772 kg)  BMI 24.13 kg/m2  SpO2 95%   Physical Exam  Constitutional: He is oriented to person, place, and time. He appears well-developed and well-nourished. No distress.  HENT:  Head: Normocephalic and atraumatic.  Eyes: Conjunctivae and EOM are normal.  Neck: Neck supple. No JVD present. No tracheal deviation present.  Cardiovascular: Normal rate, regular rhythm and normal heart sounds.   Pulmonary/Chest: Effort normal and breath sounds normal. No respiratory distress. He has no wheezes. He has no rales.  Abdominal: Soft. There is no tenderness.  Musculoskeletal: Normal range of motion. He exhibits no edema.  No pitting edema  in LEs  Neurological: He is alert and oriented to person, place, and time.  2+ and equal radial pulse  Skin: Skin is warm and dry.  Psychiatric: He has a normal mood and affect. His behavior is normal.  Nursing note and vitals reviewed.   ED Course  Procedures (including critical care time)  DIAGNOSTIC STUDIES: Oxygen Saturation is 95% on RA, adequate by my interpretation.    COORDINATION OF CARE: 1:41 AM-Discussed treatment plan which includes Troponin with pt at bedside and pt agreed to plan.   Labs Review Labs Reviewed   LIPASE, BLOOD - Abnormal; Notable for the following:    Lipase 15 (*)    All other components within normal limits  COMPREHENSIVE METABOLIC PANEL - Abnormal; Notable for the following:    Glucose, Bld 108 (*)    All other components within normal limits  CBC  I-STAT TROPOININ, ED    Imaging Review No results found.   EKG Interpretation   Date/Time:  Thursday February 14 2015 22:28:27 EDT Ventricular Rate:  83 PR Interval:  136 QRS Duration: 96 QT Interval:  380 QTC Calculation: 446 R Axis:   49 Text Interpretation:  Normal sinus rhythm Cannot rule out Inferior infarct  , age undetermined Abnormal ECG ED PHYSICIAN INTERPRETATION AVAILABLE IN  [facility] Confirmed by TEST, Record ([id number]) on 02/15/2015 6:52:20 AM      MDM   Final diagnoses:  Gastritis  Abnormal EKG    I personally performed the services described in this documentation, which was scribed in my presence. The recorded information has been reviewed and is accurate.  Pt has a very atypical chest pain. Substernal, burning pain - GERD like in nature with nausea. He is more than 3 hours since the onset, and the trop is neg. He is smoker, no other hard risk factors for CAD. He has some T wave inversions in the inferior leads. Not sure what to make of it, advised pcp f/u - and to see Cards if the chest pain gets left sided.     Derwood Kaplan, MD 02/15/15 2243

## 2015-02-15 NOTE — Discharge Instructions (Signed)
We saw you in the ER for the chest pain, stomach pain, nausea. All of our cardiac workup is normal, including labs is normal. The EKG has small discrepancy, we would like you to see Cardiologist. We are not sure what is causing your discomfort, but we feel comfortable sending you home at this time. The workup in the ER is not complete, and you should follow up with your primary care doctor for further evaluation.  Return to the Er if there is severe chest pain.   Gastritis, Adult Gastritis is soreness and swelling (inflammation) of the lining of the stomach. Gastritis can develop as a sudden onset (acute) or long-term (chronic) condition. If gastritis is not treated, it can lead to stomach bleeding and ulcers. CAUSES  Gastritis occurs when the stomach lining is weak or damaged. Digestive juices from the stomach then inflame the weakened stomach lining. The stomach lining may be weak or damaged due to viral or bacterial infections. One common bacterial infection is the Helicobacter pylori infection. Gastritis can also result from excessive alcohol consumption, taking certain medicines, or having too much acid in the stomach.  SYMPTOMS  In some cases, there are no symptoms. When symptoms are present, they may include:  Pain or a burning sensation in the upper abdomen.  Nausea.  Vomiting.  An uncomfortable feeling of fullness after eating. DIAGNOSIS  Your caregiver may suspect you have gastritis based on your symptoms and a physical exam. To determine the cause of your gastritis, your caregiver may perform the following:  Blood or stool tests to check for the H pylori bacterium.  Gastroscopy. A thin, flexible tube (endoscope) is passed down the esophagus and into the stomach. The endoscope has a light and camera on the end. Your caregiver uses the endoscope to view the inside of the stomach.  Taking a tissue sample (biopsy) from the stomach to examine under a microscope. TREATMENT    Depending on the cause of your gastritis, medicines may be prescribed. If you have a bacterial infection, such as an H pylori infection, antibiotics may be given. If your gastritis is caused by too much acid in the stomach, H2 blockers or antacids may be given. Your caregiver may recommend that you stop taking aspirin, ibuprofen, or other nonsteroidal anti-inflammatory drugs (NSAIDs). HOME CARE INSTRUCTIONS  Only take over-the-counter or prescription medicines as directed by your caregiver.  If you were given antibiotic medicines, take them as directed. Finish them even if you start to feel better.  Drink enough fluids to keep your urine clear or pale yellow.  Avoid foods and drinks that make your symptoms worse, such as:  Caffeine or alcoholic drinks.  Chocolate.  Peppermint or mint flavorings.  Garlic and onions.  Spicy foods.  Citrus fruits, such as oranges, lemons, or limes.  Tomato-based foods such as sauce, chili, salsa, and pizza.  Fried and fatty foods.  Eat small, frequent meals instead of large meals. SEEK IMMEDIATE MEDICAL CARE IF:   You have black or dark red stools.  You vomit blood or material that looks like coffee grounds.  You are unable to keep fluids down.  Your abdominal pain gets worse.  You have a fever.  You do not feel better after 1 week.  You have any other questions or concerns. MAKE SURE YOU:  Understand these instructions.  Will watch your condition.  Will get help right away if you are not doing well or get worse. Document Released: 07/07/2001 Document Revised: 01/12/2012 Document Reviewed: 08/26/2011  [employer] Patient Information 2015 [place], [place]. This information is not intended to replace advice given to you by your health care provider. Make sure you discuss any questions you have with your health care provider. Chest Pain (Nonspecific) It is often hard to give a specific diagnosis for the cause of chest pain. There is always  a chance that your pain could be related to something serious, such as a heart attack or a blood clot in the lungs. You need to follow up with your health care provider for further evaluation. CAUSES   Heartburn.  Pneumonia or bronchitis.  Anxiety or stress.  Inflammation around your heart (pericarditis) or lung (pleuritis or pleurisy).  A blood clot in the lung.  A collapsed lung (pneumothorax). It can develop suddenly on its own (spontaneous pneumothorax) or from trauma to the chest.  Shingles infection (herpes zoster virus). The chest wall is composed of bones, muscles, and cartilage. Any of these can be the source of the pain.  The bones can be bruised by injury.  The muscles or cartilage can be strained by coughing or overwork.  The cartilage can be affected by inflammation and become sore (costochondritis). DIAGNOSIS  Lab tests or other studies may be needed to find the cause of your pain. Your health care provider may have you take a test called an ambulatory electrocardiogram (ECG). An ECG records your heartbeat patterns over a 24-hour period. You may also have other tests, such as:  Transthoracic echocardiogram (TTE). During echocardiography, sound waves are used to evaluate how blood flows through your heart.  Transesophageal echocardiogram (TEE).  Cardiac monitoring. This allows your health care provider to monitor your heart rate and rhythm in real time.  Holter monitor. This is a portable device that records your heartbeat and can help diagnose heart arrhythmias. It allows your health care provider to track your heart activity for several days, if needed.  Stress tests by exercise or by giving medicine that makes the heart beat faster. TREATMENT   Treatment depends on what may be causing your chest pain. Treatment may include:  Acid blockers for heartburn.  Anti-inflammatory medicine.  Pain medicine for inflammatory conditions.  Antibiotics if an infection is  present.  You may be advised to change lifestyle habits. This includes stopping smoking and avoiding alcohol, caffeine, and chocolate.  You may be advised to keep your head raised (elevated) when sleeping. This reduces the chance of acid going backward from your stomach into your esophagus. Most of the time, nonspecific chest pain will improve within 2-3 days with rest and mild pain medicine.  HOME CARE INSTRUCTIONS   If antibiotics were prescribed, take them as directed. Finish them even if you start to feel better.  For the next few days, avoid physical activities that bring on chest pain. Continue physical activities as directed.  Do not use any tobacco products, including cigarettes, chewing tobacco, or electronic cigarettes.  Avoid drinking alcohol.  Only take medicine as directed by your health care provider.  Follow your health care provider's suggestions for further testing if your chest pain does not go away.  Keep any follow-up appointments you made. If you do not go to an appointment, you could develop lasting (chronic) problems with pain. If there is any problem keeping an appointment, call to reschedule. SEEK MEDICAL CARE IF:   Your chest pain does not go away, even after treatment.  You have a rash with blisters on your chest.  You have a fever. SEEK IMMEDIATE MEDICAL  CARE IF:   You have increased chest pain or pain that spreads to your arm, neck, jaw, back, or abdomen.  You have shortness of breath.  You have an increasing cough, or you cough up blood.  You have severe back or abdominal pain.  You feel nauseous or vomit.  You have severe weakness.  You faint.  You have chills. This is an emergency. Do not wait to see if the pain will go away. Get medical help at once. Call your local emergency services (911 in [place] Do not drive yourself to the hospital. MAKE SURE YOU:   Understand these instructions.  Will watch your condition.  Will get help right  away if you are not doing well or get worse. Document Released: 04/22/2005 Document Revised: 07/18/2013 Document Reviewed: 02/16/2008 [facility] Patient Information 2015 [place], [place]. This information is not intended to replace advice given to you by your health care provider. Make sure you discuss any questions you have with your health care provider.

## 2015-09-25 ENCOUNTER — Emergency Department (HOSPITAL_COMMUNITY): Payer: Medicare Other

## 2015-09-25 ENCOUNTER — Encounter (HOSPITAL_COMMUNITY): Payer: Self-pay | Admitting: Emergency Medicine

## 2015-09-25 ENCOUNTER — Emergency Department (HOSPITAL_COMMUNITY)
Admission: EM | Admit: 2015-09-25 | Discharge: 2015-09-26 | Disposition: A | Discharge status: Home / Self Care | Payer: Medicare Other | Attending: Emergency Medicine | Admitting: Emergency Medicine

## 2015-09-25 DIAGNOSIS — R079 Chest pain, unspecified: Secondary | ICD-10-CM | POA: Diagnosis not present

## 2015-09-25 DIAGNOSIS — J45909 Unspecified asthma, uncomplicated: Secondary | ICD-10-CM | POA: Diagnosis not present

## 2015-09-25 DIAGNOSIS — F17210 Nicotine dependence, cigarettes, uncomplicated: Secondary | ICD-10-CM | POA: Diagnosis not present

## 2015-09-25 LAB — CBC
HCT: 43.5 % (ref 39.0–52.0)
Hemoglobin: 15.6 g/dL (ref 13.0–17.0)
MCH: 29.4 pg (ref 26.0–34.0)
MCHC: 35.9 g/dL (ref 30.0–36.0)
MCV: 82.1 fL (ref 78.0–100.0)
PLATELETS: 158 10*3/uL (ref 150–400)
RBC: 5.3 MIL/uL (ref 4.22–5.81)
RDW: 13.4 % (ref 11.5–15.5)
WBC: 7 10*3/uL (ref 4.0–10.5)

## 2015-09-25 LAB — I-STAT TROPONIN, ED: Troponin i, poc: 0.01 ng/mL (ref 0.00–0.08)

## 2015-09-25 LAB — BASIC METABOLIC PANEL
ANION GAP: 11 (ref 5–15)
BUN: 18 mg/dL (ref 6–20)
CALCIUM: 9.8 mg/dL (ref 8.9–10.3)
CO2: 23 mmol/L (ref 22–32)
Chloride: 106 mmol/L (ref 101–111)
Creatinine, Ser: 0.94 mg/dL (ref 0.61–1.24)
Glucose, Bld: 113 mg/dL — ABNORMAL HIGH (ref 65–99)
Potassium: 4.1 mmol/L (ref 3.5–5.1)
SODIUM: 140 mmol/L (ref 135–145)

## 2015-09-25 MED ORDER — IBUPROFEN 800 MG PO TABS
800.0000 mg | ORAL_TABLET | Freq: Once | ORAL | Status: AC
  Administered 2015-09-26: 800 mg via ORAL
  Filled 2015-09-25: qty 1

## 2015-09-25 NOTE — ED Provider Notes (Signed)
CSN: [id number]     Arrival date & time 09/25/15  2236 History   First MD Initiated Contact with Patient 09/25/15 2342     Chief Complaint  Patient presents with  . Chest Pain   HPI Kenneth Lane is a 30 y.o. male PMH significant for asthma, bradycardia presenting with a 1 week history of left-sided chest pain. He describes the pain as nonradiating, worsened with movement, nonexertional, intermittent, lasting 1-2 seconds per episode, sharp, 8 out of 10 pain scale. He denies fevers, chills, shortness of breath, nausea, vomiting, abdominal pain changes in bowel or bladder habits. No recent surgeries, periods of immobility, personal history of blood clot. He is not taking anything for his pain.  Past Medical History  Diagnosis Date  . Seasonal allergies   . Asthma   . Bradycardia    Past Surgical History  Procedure Laterality Date  . Dental surgery     Family History  Problem Relation Age of Onset  . Cancer Other   . Hypertension Other    Social History  Substance Use Topics  . Smoking status: Current Every Day Smoker -- 1.00 packs/day    Types: Cigarettes  . Smokeless tobacco: None  . Alcohol Use: Yes     Comment: stopped x 3 weeks    Review of Systems  Ten systems are reviewed and are negative for acute change except as noted in the HPI  Allergies  Shellfish-derived products  Home Medications   Prior to Admission medications   Medication Sig Start Date End Date Taking? Authorizing Provider  famotidine (PEPCID) 20 MG tablet Take 1 tablet (20 mg total) by mouth 2 (two) times daily. Patient not taking: Reported on 09/25/2015 02/15/15   Derwood Kaplan, MD  ondansetron (ZOFRAN ODT) 8 MG disintegrating tablet Take 1 tablet (8 mg total) by mouth every 8 (eight) hours as needed for nausea. Patient not taking: Reported on 09/25/2015 02/15/15   Derwood Kaplan, MD   BP 137/89 mmHg  Pulse 75  Temp(Src) 98.3 F (36.8 C) (Oral)  Resp 18  SpO2 99% Physical Exam  Constitutional: He  appears well-developed and well-nourished. No distress.  HENT:  Head: Normocephalic and atraumatic.  Eyes: Conjunctivae are normal. Pupils are equal, round, and reactive to light. Right eye exhibits no discharge. Left eye exhibits no discharge. No scleral icterus.  Neck: No tracheal deviation present.  Cardiovascular: Normal rate, regular rhythm, normal heart sounds and intact distal pulses.  Exam reveals no gallop and no friction rub.   No murmur heard. Pulmonary/Chest: Effort normal and breath sounds normal. No respiratory distress. He has no wheezes. He has no rales. He exhibits no tenderness.  Abdominal: Soft. Bowel sounds are normal. He exhibits no distension and no mass. There is no tenderness. There is no rebound and no guarding.  Musculoskeletal: He exhibits no edema.  Lymphadenopathy:    He has no cervical adenopathy.  Neurological: He is alert. Coordination normal.  Skin: Skin is warm and dry. No rash noted. He is not diaphoretic. No erythema.  Psychiatric: He has a normal mood and affect. His behavior is normal.  Nursing note and vitals reviewed.   ED Course  Procedures  Labs Review Labs Reviewed  BASIC METABOLIC PANEL - Abnormal; Notable for the following:    Glucose, Bld 113 (*)    All other components within normal limits  CBC  I-STAT TROPOININ, ED   Imaging Review Dg Chest 2 View  09/25/2015  CLINICAL DATA:  LEFT upper chest pain  for 1 week. Shortness of breath tonight. History of asthma. EXAM: CHEST  2 VIEW COMPARISON:  Chest radiograph March 06, 2014 FINDINGS: Cardiomediastinal silhouette is normal. The lungs are clear without pleural effusions or focal consolidations. Trachea projects midline and there is no pneumothorax. Soft tissue planes and included osseous structures are non-suspicious. IMPRESSION: Normal chest. Electronically Signed   By: Awilda Metro M.D.   On: 09/25/2015 23:04   I have personally reviewed and evaluated these images and lab results as  part of my medical decision-making.   EKG Interpretation   Date/Time:  Wednesday September 25 2015 22:51:28 EST Ventricular Rate:  65 PR Interval:  153 QRS Duration: 103 QT Interval:  406 QTC Calculation: 422 R Axis:   56 Text Interpretation:  Sinus rhythm improved from prior - no acute ischemia  Confirmed by MILLER  MD, BRIAN ([id number]) on 09/26/2015 12:01:52 AM      MDM   Final diagnoses:  Chest pain, unspecified chest pain type   Patient nontoxic appearing, VSS.  Patient is to be discharged with recommendation to follow up with PCP in regards to today's hospital visit. Chest pain is not likely of cardiac or pulmonary etiology d/t presentation, PERC negative, VSS, no tracheal deviation, no JVD or new murmur, RRR, breath sounds equal bilaterally, EKG without acute abnormalities, negative troponin, and negative CXR. Pt has been advised to return to the ED if CP becomes exertional, associated with diaphoresis or nausea, radiates to left jaw/arm, worsens or becomes concerning in any way. Pt appears reliable for follow up and is agreeable to discharge.   Case has been discussed with and seen by Dr. Hyacinth Meeker who agrees with the above plan to discharge.    Melton Krebs, PA-C 09/26/15 6045  Eber Hong, MD 09/26/15 [phone]

## 2015-09-25 NOTE — ED Notes (Signed)
Pt states he has been having pains in his left chest for the past week  Pt states they have gotten worse over the past 2 days  Pt states the pains are sharp and come and go

## 2015-09-26 DIAGNOSIS — R079 Chest pain, unspecified: Secondary | ICD-10-CM | POA: Diagnosis not present

## 2015-09-26 MED ORDER — IBUPROFEN 800 MG PO TABS: 800.0000 mg | ORAL_TABLET | Freq: Three times a day (TID) | ORAL | Status: DC

## 2015-09-26 MED ORDER — METHOCARBAMOL 500 MG PO TABS
500.0000 mg | ORAL_TABLET | Freq: Two times a day (BID) | ORAL | Status: DC
Start: 2015-09-26 — End: 2016-01-26

## 2015-09-26 NOTE — Discharge Instructions (Signed)
Kenneth Lane,  Nice meeting you! Please follow-up with your primary Lane provider. Return to the emergency department if you develop increased chest pain, shortness of breath, fevers, chills. STOP SMOKING!! Feel better soon!  [name], Lane-C  Emergency Department Resource Guide 1) Find a Doctor and Pay Out of Pocket Although you won't have to find out who is covered by your insurance plan, it is a good idea to ask around and get recommendations. You will then need to call the office and see if the doctor you have chosen will accept you as a new patient and what types of options they offer for Lane who are self-pay. Some doctors offer discounts or will set up payment plans for their Lane who do not have insurance, but you will need to ask so you aren't surprised when you get to your appointment.  2) Contact Your Local Kenneth Department Not all Kenneth departments have doctors that can see Lane for sick visits, but many do, so it is worth a call to see if yours does. If you don't know where your local Kenneth department is, you can check in your phone book. The CDC also has a tool to help you locate your state's Kenneth department, and many state websites also have listings of all of their local Kenneth departments.  3) Find a Walk-in Lane If your illness is not likely to be very severe or complicated, you may want to try a walk in Lane. These are popping up all over the country in pharmacies, drugstores, and shopping centers. They're usually staffed by nurse practitioners or physician assistants that have been trained to treat common illnesses and complaints. They're usually fairly quick and inexpensive. However, if you have serious Lane issues or chronic Lane problems, these are probably not your best option.  No Primary Lane Doctor: - Call Kenneth Connect at  [phone] - they can help you locate a primary Lane doctor that  accepts your insurance, provides certain  services, etc. - Physician Referral Service- [phone]  Chronic Pain Problems: Organization         Address  Phone   Notes  Kenneth Lane Chronic Pain Lane  [phone] Lane need to be referred by their primary Lane doctor.   Medication Assistance: Organization         Address  Phone   Notes  Kenneth Lane Medication Kenneth Lane [address] [place] [place], [place] [address] [phone] --Must be a resident of Kenneth Lane -- Must have NO insurance coverage whatsoever (no Medicaid/ Medicare, etc.) -- The pt. MUST have a primary Lane doctor that directs their Lane regularly and follows them in the Kenneth   MedAssist  [phone]   [employer]  [phone]    Agencies that provide inexpensive Lane Lane: Organization         Address  Phone   Notes  Kenneth Lane  [phone]   Kenneth Lane    [phone]   Kenneth Lane [address] [place], [place] [address] [phone]   Kenneth Lane [address] [place], [place] [phone]   Kenneth Lane    [phone]   Kenneth Lane    [phone]   Kenneth Lane  [address], [place] Phone:  [phone], Fax:  [phone] Hours of Operation:  9 am - 6 pm, M-F.  Also accepts Medicaid/Medicare and  self-pay.  Kenneth Lane  [address], [place] Phone: [phone], Fax: [phone]. Hours of Operation:  8:30 am - 5:30 pm, M-F.  Also accepts Medicaid and self-pay.  Kenneth Lane - Kenneth Lane [address] [place] Lane Phone: [phone]   Kenneth Lane [address] Kenneth Lane, [place] [phone], Ext. [phone] Mondays & Thursdays: 7-9 AM.  First 15 Lane are seen on a first come, first serve basis.    Medicaid-accepting Kenneth Lane Providers:  Organization         Address  Phone   Notes  Kenneth Lane [address], [place] [phone] Also accepts self-pay Lane.  Kenneth Lane [address] Laurell Josephs [place], [place]  [phone]   Kenneth Lane [address], [place] [phone]   Kenneth Lane [address] [place] [phone]   Kenneth Lane [address], [place]   [phone] Only accepts [place] Access [place] Lane after they have their name applied to their card.   Self-Pay (no insurance) in [place] Special[facility] Lane:  Organization         Address  Phone   Notes  Kenneth Lane, Kenneth Lane [address] [place], [place] [phone]   Kenneth Lane [address] [place], [place] [phone]   Kenneth Lane [place]  [address], [place] [phone]   Kenneth Lane  [address], [place] or [address], [place] [phone] Phone number for both [place] and [place] locations is the same.  Kenneth Lane [address], [place] [phone]   Kenneth Lane [address] [place] or [address] [phone]   Kenneth Lane [address], [place] [phone], phone; [phone], fax Sees Lane 1st and 3rd Saturday of every month.  Must not qualify for public or private insurance (i.e. Medicaid, Medicare, [place] Kenneth Choice, Veterans' Benefits)  Household income should be no more than 200% of the poverty level The Lane cannot treat you if you are pregnant or think you are pregnant  Sexually transmitted diseases are not treated at the Lane.    Dental Lane: Organization         Address  Phone  Notes  [facility] Department of [facility] [place] Lane [address] [place], [place] [phone] Accepts  Lane up to age 1 who are enrolled in [place] or [place] Kenneth Choice; pregnant women with a Medicaid card; and Lane who have applied for Medicaid or [place] Kenneth Choice, but were declined, whose parents can pay a reduced fee at time of service.  [place] Lane Department of [place] Lane Lane Kenneth  [address], [place] [phone] Accepts Lane up to age 56 who are enrolled in [place] or [place] Kenneth Choice; pregnant women with a Medicaid card; and Lane who have applied for Medicaid or  Kenneth Choice, but were declined, whose parents can pay a reduced fee at time of service.  Kenneth Adult Dental Access PROGRAM  [address] [place], [place] [phone] Lane are seen by appointment only. Walk-ins are not accepted. Kenneth Dental will see Lane 30 years of age and older. Monday - Tuesday (8am-5pm) Most Wednesdays (8:30-5pm) $  30 per visit, cash only  [facility]eth [facility]Kenneth Adult [employer] [facility]ental Access PROGRAM  [address], [facility] Lane Kenneth [facility] [phone] Lane are seen by appointment only. Walk-ins are not accepted. Kenneth Dental will see Lane 28 years of age and older. One Wednesday Evening (Monthly: Volunteer Based).  $30 per visit, cash only  [employer] [facility] [facility]  [phone] for adults; Lane under age 414, call Graduate Pediatric Dentistry at [phone]. Lane aged 254-14, please call [phone] to request a pediatric application.  Dental services are provided in all areas of dental Lane including fillings, crowns and bridges, complete and partial dentures, implants, gum treatment, root canals, and extractions. Preventive Lane is also provided. Treatment is provided to both adults and Lane. Lane are selected via a lottery and there is often a waiting list.   [facility] Lane [address], [place]  [phone] [email or web address]   [facility]eth [facility] [address], [place], [place] [phone], Ext. [phone] Second and  Fourth Thursday of each month, opens at 6:30 AM; Lane ends at 9 AM.  Lane are seen on a first-come first-served basis, and a limited number are seen during each Lane.   [facility] Lane  [address], [place], [place] [phone]   Eligibility Requirements You must have lived in [place], [place], or [place] counties for at least the last three months.   You cannot be eligible for state or federal sponsored National Cityhealthcare insurance, including CIGNAVeterans Administration, [place]icaid, or [employer]icare.   You generally cannot be eligible for healthcare insurance through your employer.    How to apply: Eligibility screenings are held every Tuesday and Wednesday afternoon from 1:00 pm until 4:00 pm. You do not need an appointment for the interview!  [facility] Lane [address], [place], [place] [phone]   [facility] Kenneth Department  [phone]   [facility] Kenneth Department  [phone]   [facility] Kenneth Department  [phone]    Kenneth Kenneth Resources in the Kenneth: Intensive Outpatient Programs Organization         Address  Phone  Notes  [facility] Kenneth Kenneth Services [address], [place], [place] [phone]   [facility] Kenneth Outpatient [address], [place], [place] [phone]   [facility] Drug S[facility] [address], [place], [place]  [phone]   [facility] Kenneth [address],  [place], [place] [phone] or [phone]   Substance Abuse Resources Organization         Address  Phone  Notes  Alcohol and Drug Services  [phone]   [facility]  [phone]   [facility]  [phone]   [facility]  [phone]   Residential & Outpatient Substance Abuse Program  [phone]   Psychological Services Organization         Address  Phone  Notes  [facility] Kenneth  [phone]- [phone]   [facility]  [phone]   [facility] Kenneth  [address], [place] [phone] or [phone]    Mobile Crisis Teams Organization         Address  Phone  Notes  [facility], Mobile Crisis Lane Unit  [phone]   [facility]  [address] [place], [place] [phone]   Doristine LocksSharon DeEsch [address] [place] [phone]    Self-Help/Support Groups Organization         Address  Phone  Notes  Mental Kenneth Assoc[facility] of [place] - variety of support groups  Horizon Kenneth Call for more information  Narcotics Anonymous (NA), Caring Services [address], [employer] [place]  2 meetings at this location   Special educational needs teacher         Address  Phone  Notes  [facility] Residential Treatment [place],    [place]  [phone]   [facility] Lane  [address] [place] 762831, [place], [place]   [place], [place] [phone] Admissions: 8am-3pm M-F  Incentives Substance [place] [address]    [place], [place] [phone]   The Ringer Lane [address] [place], [place], [place]   [facility] [place] [address],  [place], [place]   [facility] - Intensive Outpatient Kenneth [address] Kristeen [address], [place], [place]   [facility] [place] ([place].,  [place], [place] [phone] or [phone]   [facility] Treatment Services (RTS) [address] [place], [place] Accepts Medicaid  [facility] [employer] [address],  [place] [phone] Substance Abuse/Addiction Treatment   [facility] Organization         Address  Phone  Notes  [facility]  [phone]   Domenic Schwab, PhD [address] Arlis Porta [place], [place]   [phone] or [phone]   [place], [place] [phone]   [facility] [address] [place], [place] [phone] Insurance/Medicaid/sponsorship through [facility] Lane and Families [address], [address] [place]                                    [place], [place] [phone] Scraper [address], [place] [phone]    Dr. Adele Schilder  [phone]   Free Lane of [place] Dept. 1) [address] [place] 2) [place] 3)  [place] 65, [place] [phone]  [phone]   [place] [phone] or [phone] (After Hours)

## 2015-09-26 NOTE — ED Provider Notes (Signed)
29, hx of L sided sharp CP - lasts 1-2 seconds, worse with moving, no exertional sx, no fevers or chills or coughing, Exam without any pulmonary or cardiac findings - RRR, no m/r/g, no wheezign, no edema or asymetry of legs. ECG and labs reviewed - [pt informed, stable for d/c with motrin - doubt ACS or PE.  Pt counseled to stop smoking.   EKG Interpretation  Date/Time:  Wednesday September 25 2015 22:51:28 EST Ventricular Rate:  65 PR Interval:  153 QRS Duration: 103 QT Interval:  406 QTC Calculation: 422 R Axis:   56 Text Interpretation:  Sinus rhythm improved from prior - no acute ischemia Confirmed by Delton Stelle  MD, Lennis Rader ([id number]) on 09/26/2015 12:01:52 AM       Medical screening examination/treatment/procedure(s) were conducted as a shared visit with non-physician practitioner(s) and myself.  I personally evaluated the patient during the encounter.  Clinical Impression:   Final diagnoses:  Chest pain, unspecified chest pain type       Eber Hong, MD 09/26/15 [phone]

## 2016-01-26 ENCOUNTER — Emergency Department (HOSPITAL_COMMUNITY)
Admission: EM | Admit: 2016-01-26 | Discharge: 2016-01-26 | Disposition: A | Discharge status: Home / Self Care | Payer: Medicare Other | Attending: Emergency Medicine | Admitting: Emergency Medicine

## 2016-01-26 ENCOUNTER — Emergency Department (HOSPITAL_COMMUNITY): Payer: Medicare Other

## 2016-01-26 ENCOUNTER — Encounter (HOSPITAL_COMMUNITY): Payer: Self-pay

## 2016-01-26 DIAGNOSIS — Y939 Activity, unspecified: Secondary | ICD-10-CM | POA: Insufficient documentation

## 2016-01-26 DIAGNOSIS — Y92410 Unspecified street and highway as the place of occurrence of the external cause: Secondary | ICD-10-CM | POA: Insufficient documentation

## 2016-01-26 DIAGNOSIS — F17210 Nicotine dependence, cigarettes, uncomplicated: Secondary | ICD-10-CM | POA: Diagnosis not present

## 2016-01-26 DIAGNOSIS — S0083XA Contusion of other part of head, initial encounter: Secondary | ICD-10-CM | POA: Diagnosis not present

## 2016-01-26 DIAGNOSIS — J45909 Unspecified asthma, uncomplicated: Secondary | ICD-10-CM | POA: Insufficient documentation

## 2016-01-26 DIAGNOSIS — S0993XA Unspecified injury of face, initial encounter: Secondary | ICD-10-CM | POA: Diagnosis present

## 2016-01-26 DIAGNOSIS — Y999 Unspecified external cause status: Secondary | ICD-10-CM | POA: Insufficient documentation

## 2016-01-26 MED ORDER — HYDROCODONE-ACETAMINOPHEN 5-325 MG PO TABS
1.0000 | ORAL_TABLET | Freq: Once | ORAL | Status: AC
  Administered 2016-01-26: 1 via ORAL
  Filled 2016-01-26: qty 1

## 2016-01-26 MED ORDER — HYDROCODONE-ACETAMINOPHEN 5-325 MG PO TABS: 1.0000 | ORAL_TABLET | ORAL | Status: DC | PRN
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
## 2016-01-26 NOTE — Discharge Instructions (Signed)
Use ice on the sore spots 3 or 4 times each day. Do not drive or work when taking the pain medication.   Contusion A contusion is a deep bruise. Contusions are the result of a blunt injury to tissues and muscle fibers under the skin. The injury causes bleeding under the skin. The skin overlying the contusion may turn blue, purple, or yellow. Minor injuries will give you a painless contusion, but more severe contusions may stay painful and swollen for a few weeks.  CAUSES  This condition is usually caused by a blow, trauma, or direct force to an area of the body. SYMPTOMS  Symptoms of this condition include:  Swelling of the injured area.  Pain and tenderness in the injured area.  Discoloration. The area may have redness and then turn blue, purple, or yellow. DIAGNOSIS  This condition is diagnosed based on a physical exam and medical history. An X-ray, CT scan, or MRI may be needed to determine if there are any associated injuries, such as broken bones (fractures). TREATMENT  Specific treatment for this condition depends on what area of the body was injured. In general, the best treatment for a contusion is resting, icing, applying pressure to (compression), and elevating the injured area. This is often called the RICE strategy. Over-the-counter anti-inflammatory medicines may also be recommended for pain control.  HOME CARE INSTRUCTIONS   Rest the injured area.  If directed, apply ice to the injured area:  Put ice in a plastic bag.  Place a towel between your skin and the bag.  Leave the ice on for 20 minutes, 2-3 times per day.  If directed, apply light compression to the injured area using an elastic bandage. Make sure the bandage is not wrapped too tightly. Remove and reapply the bandage as directed by your health care provider.  If possible, raise (elevate) the injured area above the level of your heart while you are sitting or lying down.  Take over-the-counter and  prescription medicines only as told by your health care provider. SEEK MEDICAL CARE IF:  Your symptoms do not improve after several days of treatment.  Your symptoms get worse.  You have difficulty moving the injured area. SEEK IMMEDIATE MEDICAL CARE IF:   You have severe pain.  You have numbness in a hand or foot.  Your hand or foot turns pale or cold.   This information is not intended to replace advice given to you by your health care provider. Make sure you discuss any questions you have with your health care provider.   Document Released: 04/22/2005 Document Revised: 04/03/2015 Document Reviewed: 11/28/2014 Elsevier Interactive Patient Education 2016 [employer]  Tourist information centre managerMotor Vehicle Collision It is common to have multiple bruises and sore muscles after a motor vehicle collision (MVC). These tend to feel worse for the first 24 hours. You may have the most stiffness and soreness over the first several hours. You may also feel worse when you wake up the first morning after your collision. After this point, you will usually begin to improve with each day. The speed of improvement often depends on the severity of the collision, the number of injuries, and the location and nature of these injuries. HOME CARE INSTRUCTIONS  Put ice on the injured area.  Put ice in a plastic bag.  Place a towel between your skin and the bag.  Leave the ice on for 15-20 minutes, 3-4 times a day, or as directed by your health care provider.  Drink enough  fluids to keep your urine clear or pale yellow. Do not drink alcohol.  Take a warm shower or bath once or twice a day. This will increase blood flow to sore muscles.  You may return to activities as directed by your caregiver. Be careful when lifting, as this may aggravate neck or back pain.  Only take over-the-counter or prescription medicines for pain, discomfort, or fever as directed by your caregiver. Do not use aspirin. This may increase bruising  and bleeding. SEEK IMMEDIATE MEDICAL CARE IF:  You have numbness, tingling, or weakness in the arms or legs.  You develop severe headaches not relieved with medicine.  You have severe neck pain, especially tenderness in the middle of the back of your neck.  You have changes in bowel or bladder control.  There is increasing pain in any area of the body.  You have shortness of breath, light-headedness, dizziness, or fainting.  You have chest pain.  You feel sick to your stomach (nauseous), throw up (vomit), or sweat.  You have increasing abdominal discomfort.  There is blood in your urine, stool, or vomit.  You have pain in your shoulder (shoulder strap areas).  You feel your symptoms are getting worse. MAKE SURE YOU:  Understand these instructions.  Will watch your condition.  Will get help right away if you are not doing well or get worse.   This information is not intended to replace advice given to you by your health care provider. Make sure you discuss any questions you have with your health care provider.   Document Released: 07/13/2005 Document Revised: 08/03/2014 Document Reviewed: 12/10/2010 Elsevier Interactive Patient Education [employer] Elsevier Inc.

## 2016-01-26 NOTE — ED Notes (Signed)
Pt comfortable with discharge and follow up instructions. Pt declines wheelchair, escorted to waiting area by this RN. Rx x1 
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
## 2016-02-26 ENCOUNTER — Emergency Department (HOSPITAL_COMMUNITY)
Admission: EM | Admit: 2016-02-26 | Discharge: 2016-02-26 | Disposition: A | Discharge status: Home / Self Care | Payer: Medicare Other | Attending: Emergency Medicine | Admitting: Emergency Medicine

## 2016-02-26 ENCOUNTER — Encounter (HOSPITAL_COMMUNITY): Payer: Self-pay | Admitting: Emergency Medicine

## 2016-02-26 DIAGNOSIS — R112 Nausea with vomiting, unspecified: Secondary | ICD-10-CM | POA: Insufficient documentation

## 2016-02-26 DIAGNOSIS — F17210 Nicotine dependence, cigarettes, uncomplicated: Secondary | ICD-10-CM | POA: Diagnosis not present

## 2016-02-26 DIAGNOSIS — J45909 Unspecified asthma, uncomplicated: Secondary | ICD-10-CM | POA: Diagnosis not present

## 2016-02-26 MED ORDER — ONDANSETRON 4 MG PO TBDP: 4.0000 mg | ORAL_TABLET | Freq: Three times a day (TID) | ORAL | 0 refills | Status: DC | PRN

## 2016-02-26 MED ORDER — ONDANSETRON 4 MG PO TBDP
4.0000 mg | ORAL_TABLET | Freq: Once | ORAL | Status: AC
  Administered 2016-02-26: 4 mg via ORAL
  Filled 2016-02-26: qty 1

## 2016-02-26 NOTE — ED Triage Notes (Signed)
Patient c/o states he ate cheese pizza tonight, went to sleep, woke up feeling light headed, right eye hurting, stomach hurting, vomiting x 3 with EMS. Allergy to shellfish. Patient alert and oriented x 4. BP 121/77, HR 62, 18 LAC, 4 mg IV Zofran, patient c/o tounge swollen, EMS states doesn't look swollen. Hx asthma. Denies drugs/alcohol use. Patient states stomach still hurts a little but no other symptoms.

## 2016-02-26 NOTE — Discharge Instructions (Signed)
You have been seen today for nausea and vomiting. This was likely from the meal you ate right before symptoms began. Use the Zofran for any further nausea. Drink plenty of fluids to stay well hydrated. Follow up with PCP as needed should symptoms recur. Return to ED as needed.

## 2016-02-26 NOTE — ED Provider Notes (Signed)
[facility]-EMERGENCY DEPT Provider Note   CSN: [id number] Arrival date & time: 02/26/16  0009  First Provider Contact:  First MD Initiated Contact with Patient 02/26/16 0023        History   Chief Complaint Chief Complaint  Patient presents with  . Abdominal Pain  . Emesis    HPI Kenneth Lane is a 30 y.o. male.  HPI   Kenneth Lane is a 30 y.o. male, with a history of Asthma, presenting to the ED with an episode of abdominal cramping, nausea, and three episodes of emesis beginning around 11 pm last evening (about 1.5 hours ago). Pt states he ate some pizza around 6 pm, went to bed, and then woke up around 11 pm with nausea, sweating, lightheadedness, and then vomiting. All symptoms have since resolved and he started to feel better right after he vomited. Pt denies fever/chills, diarrhea, shortness of breath, chest pain, or any other complaints.      Past Medical History:  Diagnosis Date  . Asthma   . Bradycardia   . Seasonal allergies     There are no active problems to display for this patient.   Past Surgical History:  Procedure Laterality Date  . DENTAL SURGERY         Home Medications    Prior to Admission medications   Medication Sig Start Date End Date Taking? Authorizing Provider  HYDROcodone-acetaminophen (NORCO) 5-325 MG tablet Take 1 tablet by mouth every 4 (four) hours as needed. 01/26/16   Mancel Bale, MD  ondansetron (ZOFRAN ODT) 4 MG disintegrating tablet Take 1 tablet (4 mg total) by mouth every 8 (eight) hours as needed for nausea or vomiting. 02/26/16   Anselm Pancoast, PA-C    Family History Family History  Problem Relation Age of Onset  . Cancer Other   . Hypertension Other     Social History Social History  Substance Use Topics  . Smoking status: Current Every Day Smoker    Packs/day: 1.00    Types: Cigarettes  . Smokeless tobacco: Current User  . Alcohol use Yes     Comment: stopped x 3 weeks     Allergies   Shellfish-derived  products   Review of Systems Review of Systems  Constitutional: Positive for diaphoresis (resolved). Negative for chills and fever.  Respiratory: Negative for shortness of breath.   Cardiovascular: Negative for chest pain.  Gastrointestinal: Positive for abdominal pain (resolved), nausea (resolved) and vomiting (resolved). Negative for diarrhea.  Neurological: Positive for light-headedness (resolved).  All other systems reviewed and are negative.    Physical Exam Updated Vital Signs BP 120/79 (BP Location: Right Arm)   Pulse (!) 54   Temp 97.6 F (36.4 C) (Oral)   Resp 18   Ht 5\' 5"  (1.651 m)   Wt 65.8 kg   SpO2 98%   BMI 24.13 kg/m   Physical Exam  Constitutional: He appears well-developed and well-nourished. No distress.  HENT:  Head: Normocephalic and atraumatic.  Eyes: Conjunctivae are normal.  Neck: Neck supple.  Cardiovascular: Normal rate, regular rhythm, normal heart sounds and intact distal pulses.   Pulmonary/Chest: Effort normal and breath sounds normal. No respiratory distress.  Abdominal: Soft. Bowel sounds are normal. There is no tenderness. There is no guarding.  Musculoskeletal: He exhibits no edema or tenderness.  Lymphadenopathy:    He has no cervical adenopathy.  Neurological: He is alert.  Skin: Skin is warm and dry. He is not diaphoretic.  Psychiatric: He has a  normal mood and affect. His behavior is normal.  Nursing note and vitals reviewed.    ED Treatments / Results  Labs (all labs ordered are listed, but only abnormal results are displayed) Labs Reviewed - No data to display  EKG  EKG Interpretation None       Radiology No results found.  Procedures Procedures (including critical care time)  Medications Ordered in ED Medications  ondansetron (ZOFRAN-ODT) disintegrating tablet 4 mg (4 mg Oral Given 02/26/16 0105)     Initial Impression / Assessment and Plan / ED Course  I have reviewed the triage vital signs and the nursing  notes.  Pertinent labs & imaging results that were available during my care of the patient were reviewed by me and considered in my medical decision making (see chart for details).  Clinical Course    Kenneth Lane presents with a single episode of abdominal cramping, nausea, and vomiting that occurred just prior to arrival.  Suspect the patient's symptoms are connected with the meal he just ate prior to the onset of symptoms. Symptoms resolved upon vomiting. Patient has no complaints upon my interview. Able to pass an oral fluid challenge without difficulty. The patient was given instructions for home care as well as return precautions. Patient voices understanding of these instructions, accepts the plan, and is comfortable with discharge.  Vitals:   02/26/16 0011 02/26/16 0012 02/26/16 0013  BP: 120/79  120/79  Pulse:  (!) 54 (!) 54  Resp:   18  Temp:   97.6 F (36.4 C)  TempSrc:   Oral  SpO2:  98% 98%  Weight:   65.8 kg  Height:    (1.651 m)     Final Clinical Impressions(s) / ED Diagnoses   Final diagnoses:  Non-intractable vomiting with nausea, vomiting of unspecified type    New Prescriptions Discharge Medication List as of 02/26/2016  1:10 AM    START taking these medications   Details  ondansetron (ZOFRAN ODT) 4 MG disintegrating tablet Take 1 tablet (4 mg total) by mouth every 8 (eight) hours as needed for nausea or vomiting., Starting Wed 02/26/2016, Print         Anselm Pancoast, PA-C 02/26/16 2153    Derwood Kaplan, MD 02/29/16 1610

## 2016-03-06 ENCOUNTER — Emergency Department (HOSPITAL_COMMUNITY)
Admission: EM | Admit: 2016-03-06 | Discharge: 2016-03-06 | Disposition: A | Discharge status: Home / Self Care | Payer: Medicare Other | Attending: Emergency Medicine | Admitting: Emergency Medicine

## 2016-03-06 ENCOUNTER — Encounter (HOSPITAL_COMMUNITY): Payer: Self-pay | Admitting: Emergency Medicine

## 2016-03-06 DIAGNOSIS — M79622 Pain in left upper arm: Secondary | ICD-10-CM | POA: Insufficient documentation

## 2016-03-06 DIAGNOSIS — F17210 Nicotine dependence, cigarettes, uncomplicated: Secondary | ICD-10-CM | POA: Insufficient documentation

## 2016-03-06 DIAGNOSIS — M79602 Pain in left arm: Secondary | ICD-10-CM

## 2016-03-06 DIAGNOSIS — J45909 Unspecified asthma, uncomplicated: Secondary | ICD-10-CM | POA: Diagnosis not present

## 2016-03-06 DIAGNOSIS — Z79899 Other long term (current) drug therapy: Secondary | ICD-10-CM | POA: Insufficient documentation

## 2016-03-06 MED ORDER — NAPROXEN 500 MG PO TABS: 500.0000 mg | ORAL_TABLET | Freq: Two times a day (BID) | ORAL | 0 refills | Status: DC | PRN

## 2016-03-06 NOTE — Progress Notes (Signed)
[employer] access response hx indicates the [facility] [address] [place], [place] [id number]  [phone]

## 2016-03-06 NOTE — ED Provider Notes (Signed)
[facility]-EMERGENCY DEPT Provider Note   CSN: [id number] Arrival date & time: 03/06/16  1554  First Provider Contact:  First MD Initiated Contact with Patient 03/06/16 1630     By signing my name below, I, Kenneth Lane, attest that this documentation has been prepared under the direction and in the presence of Noelle Penner, PA-C.  Electronically Signed: Rosario Lane, ED Scribe. 03/06/16. 4:41 PM.  History   Chief Complaint Chief Complaint  Patient presents with  . Arm Pain   HPI HPI Comments: Kenneth Lane is a 30 y.o. male with no perintent PMHx, who presents to the Emergency Department complaining of gradual onset, gradually worsening, constant, sharp and sore, 2/10 diffuse left arm pain x ~2-3 days. He reports associated intermittent episodes numbness and tingling to the arm. He states that he was recently lifting a heavy washing machine prior to the onset of his pain, and he noticed the pain after that incident. No OTC medications or home remedies tried PTA. His pain is mildly exacerbated with movement to the arm and palpation. Denies neck pain, back pain, or any other associated symptoms.   Past Medical History:  Diagnosis Date  . Asthma   . Bradycardia   . Seasonal allergies    There are no active problems to display for this patient.  Past Surgical History:  Procedure Laterality Date  . DENTAL SURGERY      Home Medications    Prior to Admission medications   Medication Sig Start Date End Date Taking? Authorizing Provider  HYDROcodone-acetaminophen (NORCO) 5-325 MG tablet Take 1 tablet by mouth every 4 (four) hours as needed. 01/26/16   Mancel Bale, MD  ondansetron (ZOFRAN ODT) 4 MG disintegrating tablet Take 1 tablet (4 mg total) by mouth every 8 (eight) hours as needed for nausea or vomiting. 02/26/16   Anselm Pancoast, PA-C   Family History Family History  Problem Relation Age of Onset  . Cancer Other   . Hypertension Other    Social History Social  History  Substance Use Topics  . Smoking status: Current Every Day Smoker    Packs/day: 1.00    Types: Cigarettes  . Smokeless tobacco: Current User  . Alcohol use Yes     Comment: stopped x 3 weeks   Allergies   Shellfish-derived products  Review of Systems Review of Systems A complete 10 system review of systems was obtained and all systems are negative except as noted in the HPI and PMH.   Physical Exam Updated Vital Signs BP 139/66 (BP Location: Right Arm)   Pulse 86   Temp 98.4 F (36.9 C) (Oral)   Resp 20   Ht  (1.651 m)   Wt 142 lb (64.4 kg)   SpO2 98%   BMI 23.63 kg/m   Physical Exam  Constitutional: He appears well-developed and well-nourished.  HENT:  Head: Normocephalic.  Eyes: Conjunctivae are normal.  Cardiovascular: Normal rate.   Pulmonary/Chest: Effort normal. No respiratory distress.  Abdominal: He exhibits no distension.  Musculoskeletal: Normal range of motion.  Left arm with mild, diffuse TTP. No focal injuries or deformity. No discoloration or edema. Full ROM. 2+ radial pulses. 5/5 strength throughout. No C-spine of midline back tenderness.   Neurological: He is alert.  Skin: Skin is warm and dry.  Psychiatric: He has a normal mood and affect. His behavior is normal.  Nursing note and vitals reviewed.  ED Treatments / Results  DIAGNOSTIC STUDIES: Oxygen Saturation is 98% on RA,  normal by my interpretation.   COORDINATION OF CARE: 4:40 PM-Discussed next steps with pt. Pt verbalized understanding and is agreeable with the plan.   Radiology No results found.  Procedures Procedures (including critical care time)  Medications Ordered in ED Medications - No data to display  Initial Impression / Assessment and Plan / ED Course  I have reviewed the triage vital signs and the nursing notes.  Pertinent labs & imaging results that were available during my care of the patient were reviewed by me and considered in my medical decision  making (see chart for details).  Clinical Course   Suspect muscle soreness from heavy lifting. He has no focal neuro deficits. He is neurovascularly intact. Will prescribe naproxen as needed for pain. Work note given per pt request. ER return precautions given.   Final Clinical Impressions(s) / ED Diagnoses   Final diagnoses:  Left arm pain    New Prescriptions Discharge Medication List as of 03/06/2016  4:41 PM    START taking these medications   Details  naproxen (NAPROSYN) 500 MG tablet Take 1 tablet (500 mg total) by mouth 2 (two) times daily as needed., Starting Fri 03/06/2016, Print       I personally performed the services described in this documentation, which was scribed in my presence. The recorded information has been reviewed and is accurate.     Carlene CoriaSerena Y Maveryk Renstrom, PA-C 03/06/16 1654    Loren Raceravid Yelverton, MD 03/06/16 [phone]

## 2016-03-06 NOTE — ED Notes (Signed)
Pt c/o left arm pain x 2 days, pain started after he was helping to move a washing machine, pt states that he has been having trouble at his job due to the pain and they told him he needed to come to the ER to have it checked out before they will let him come back to work, pt will need a note for his employer.

## 2016-03-06 NOTE — ED Triage Notes (Signed)
Patient states that he has left arm pain since Tuesday. Pain started while lifting a washer machine. Patient having pain still radiates down arm and to hand.  Patient told him work needed him to get arm checked out so patient can return to work.

## 2016-03-06 NOTE — Discharge Instructions (Signed)
Take naproxen as needed for pain. You may use a heating pad to help alleviate some of your muscle tension. Please follow up with primary care as instructed below.

## 2016-03-09 ENCOUNTER — Emergency Department (HOSPITAL_COMMUNITY)
Admission: EM | Admit: 2016-03-09 | Discharge: 2016-03-09 | Disposition: A | Discharge status: Home / Self Care | Payer: Medicare Other | Attending: Emergency Medicine | Admitting: Emergency Medicine

## 2016-03-09 ENCOUNTER — Encounter (HOSPITAL_COMMUNITY): Payer: Self-pay

## 2016-03-09 DIAGNOSIS — F17210 Nicotine dependence, cigarettes, uncomplicated: Secondary | ICD-10-CM | POA: Insufficient documentation

## 2016-03-09 DIAGNOSIS — J45909 Unspecified asthma, uncomplicated: Secondary | ICD-10-CM | POA: Insufficient documentation

## 2016-03-09 DIAGNOSIS — T39312A Poisoning by propionic acid derivatives, intentional self-harm, initial encounter: Secondary | ICD-10-CM | POA: Diagnosis not present

## 2016-03-09 DIAGNOSIS — T7840XA Allergy, unspecified, initial encounter: Secondary | ICD-10-CM | POA: Diagnosis present

## 2016-03-09 DIAGNOSIS — T50905A Adverse effect of unspecified drugs, medicaments and biological substances, initial encounter: Secondary | ICD-10-CM

## 2016-03-09 NOTE — ED Notes (Addendum)
Pt left without D/C papers and D/C vitals and did not sign out.

## 2016-03-09 NOTE — Discharge Instructions (Signed)
Stop taking the Naproxen you were prescribed.  Take ibuprofen 600 mg every 6 hours as needed for pain.  Follow-up with your primary Dr. if you're not improving in the next week.

## 2016-03-09 NOTE — ED Triage Notes (Signed)
Pt states that he started taking naproxen today. Reports, intermittent dry mouth, nausea, vomiting, "multiple anxiety attacks," and left arm numbness. A&Ox4. Ambulatory in triage.

## 2016-03-09 NOTE — ED Provider Notes (Signed)
[facility]-EMERGENCY DEPT Provider Note   CSN: [id number] Arrival date & time: 03/09/16  0004  By signing my name below, I, Phillis HaggisGabriella Gaje, attest that this documentation has been prepared under the direction and in the presence of Geoffery Lyonsouglas Hiran Leard, MD. Electronically Signed: Phillis HaggisGabriella Gaje, ED Scribe. 03/09/16. 2:33 AM.  First Provider Contact:  None    History   Chief Complaint Chief Complaint  Patient presents with  . Allergic Reaction   The history is provided by the patient. No language interpreter was used.  HPI Comments: Kenneth Lane is a 30 y.o. Male with a hx of bradycardia who presents to the Emergency Department complaining of a possible allergic reaction onset earlier today. Pt states that he was prescribed Naproxen for left arm pain on 03/06/16. He reports that he took the first dose today and began to experience dry mouth, nausea, vomiting, and "anxiety attacks." He states that his symptoms are resolving but continues to have arm pain. He has taken ibuprofen in the past with no complications. He states that it may have been caused by taking the medication on an empty stomach. He denies chest pain, SOB, or diarrhea.   Past Medical History:  Diagnosis Date  . Asthma   . Bradycardia   . Seasonal allergies     There are no active problems to display for this patient.   Past Surgical History:  Procedure Laterality Date  . DENTAL SURGERY       Home Medications    Prior to Admission medications   Medication Sig Start Date End Date Taking? Authorizing Provider  naproxen (NAPROSYN) 500 MG tablet Take 1 tablet (500 mg total) by mouth 2 (two) times daily as needed. 03/06/16  Yes Ace GinsSerena Y Sam, PA-C  HYDROcodone-acetaminophen (NORCO) 5-325 MG tablet Take 1 tablet by mouth every 4 (four) hours as needed. Patient not taking: Reported on 03/09/2016 01/26/16   Mancel BaleElliott Wentz, MD  ondansetron (ZOFRAN ODT) 4 MG disintegrating tablet Take 1 tablet (4 mg total) by mouth every 8 (eight) hours  as needed for nausea or vomiting. Patient not taking: Reported on 03/09/2016 02/26/16   Anselm PancoastShawn C Joy, PA-C    Family History Family History  Problem Relation Age of Onset  . Cancer Other   . Hypertension Other     Social History Social History  Substance Use Topics  . Smoking status: Current Every Day Smoker    Packs/day: 1.00    Types: Cigarettes  . Smokeless tobacco: Never Used  . Alcohol use Yes     Comment: stopped x 3 weeks     Allergies   Shellfish-derived products   Review of Systems Review of Systems  HENT:       Dry mouth  Respiratory: Negative for shortness of breath.   Cardiovascular: Negative for chest pain.  Gastrointestinal: Positive for nausea and vomiting. Negative for diarrhea.  Musculoskeletal: Positive for arthralgias.  Psychiatric/Behavioral: The patient is nervous/anxious.   All other systems reviewed and are negative.    Physical Exam Updated Vital Signs BP 123/88 (BP Location: Left Arm)   Pulse 84   Temp 98.8 F (37.1 C) (Oral)   Resp 20   SpO2 100%   Physical Exam  Constitutional: He is oriented to person, place, and time. He appears well-developed and well-nourished.  HENT:  Head: Normocephalic and atraumatic.  Eyes: EOM are normal.  Neck: Normal range of motion.  Cardiovascular: Normal rate, regular rhythm, normal heart sounds and intact distal pulses.   Pulmonary/Chest: Effort normal  and breath sounds normal. No respiratory distress.  Abdominal: Soft. He exhibits no distension. There is no tenderness.  Musculoskeletal: Normal range of motion.  Neurological: He is alert and oriented to person, place, and time.  Skin: Skin is warm and dry.  Psychiatric: He has a normal mood and affect. Judgment normal.  Nursing note and vitals reviewed.    ED Treatments / Results  DIAGNOSTIC STUDIES: Oxygen Saturation is 100% on RA, normal by my interpretation.    COORDINATION OF CARE: 2:32 AM-Discussed treatment plan which includes OTC  medication with pt at bedside and pt agreed to plan.    Labs (all labs ordered are listed, but only abnormal results are displayed) Labs Reviewed - No data to display  EKG  EKG Interpretation None       Radiology No results found.  Procedures Procedures (including critical care time)  Medications Ordered in ED Medications - No data to display   Initial Impression / Assessment and Plan / ED Course  I have reviewed the triage vital signs and the nursing notes.  Pertinent labs & imaging results that were available during my care of the patient were reviewed by me and considered in my medical decision making (see chart for details).  Clinical Course    Final Clinical Impressions(s) / ED Diagnoses   Final diagnoses:  None   Patient presents with complaints of nausea and "panic attacks" that he believes are related to being prescribed naproxen for shoulder pain. He appears quite well. Vitals are stable. I will change him from naproxen to ibuprofen as he has taken this in the past without any problems. I see no reason for any further workup. He is clearly not experiencing a true allergic reaction and there is no evidence for anaphylaxis. His complaints are more of a side effect.   I personally performed the services described in this documentation, which was scribed in my presence. The recorded information has been reviewed and is accurate.    New Prescriptions New Prescriptions   No medications on file     Geoffery Lyonsouglas Jermal Dismuke, MD 03/09/16 [phone]

## 2016-03-12 ENCOUNTER — Emergency Department (HOSPITAL_COMMUNITY)
Admission: EM | Admit: 2016-03-12 | Discharge: 2016-03-12 | Disposition: A | Discharge status: Home / Self Care | Payer: Medicare Other | Attending: Emergency Medicine | Admitting: Emergency Medicine

## 2016-03-12 ENCOUNTER — Encounter (HOSPITAL_COMMUNITY): Payer: Self-pay | Admitting: Emergency Medicine

## 2016-03-12 DIAGNOSIS — F419 Anxiety disorder, unspecified: Secondary | ICD-10-CM | POA: Diagnosis not present

## 2016-03-12 DIAGNOSIS — J45909 Unspecified asthma, uncomplicated: Secondary | ICD-10-CM | POA: Diagnosis not present

## 2016-03-12 DIAGNOSIS — F410 Panic disorder [episodic paroxysmal anxiety] without agoraphobia: Secondary | ICD-10-CM | POA: Insufficient documentation

## 2016-03-12 DIAGNOSIS — F17210 Nicotine dependence, cigarettes, uncomplicated: Secondary | ICD-10-CM | POA: Insufficient documentation

## 2016-03-12 LAB — URINALYSIS, ROUTINE W REFLEX MICROSCOPIC
BILIRUBIN URINE: NEGATIVE
GLUCOSE, UA: NEGATIVE mg/dL
HGB URINE DIPSTICK: NEGATIVE
Ketones, ur: NEGATIVE mg/dL
Leukocytes, UA: NEGATIVE
Nitrite: NEGATIVE
PH: 6.5 (ref 5.0–8.0)
Protein, ur: NEGATIVE mg/dL
SPECIFIC GRAVITY, URINE: 1.021 (ref 1.005–1.030)

## 2016-03-12 LAB — BASIC METABOLIC PANEL
Anion gap: 7 (ref 5–15)
BUN: 16 mg/dL (ref 6–20)
CHLORIDE: 108 mmol/L (ref 101–111)
CO2: 23 mmol/L (ref 22–32)
CREATININE: 0.87 mg/dL (ref 0.61–1.24)
Calcium: 9.4 mg/dL (ref 8.9–10.3)
GFR calc Af Amer: >60 mL/min (ref >60)
GFR calc non Af Amer: >60 mL/min (ref >60)
GLUCOSE: 102 mg/dL — AB (ref 65–99)
POTASSIUM: 3.7 mmol/L (ref 3.5–5.1)
Sodium: 138 mmol/L (ref 135–145)

## 2016-03-12 LAB — I-STAT TROPONIN, ED: Troponin i, poc: 0 ng/mL (ref 0.00–0.08)

## 2016-03-12 LAB — CBC WITH DIFFERENTIAL/PLATELET
Basophils Absolute: 0.1 10*3/uL (ref 0.0–0.1)
Basophils Relative: 1 %
EOS ABS: 0.8 10*3/uL — AB (ref 0.0–0.7)
EOS PCT: 12 %
HCT: 44.5 % (ref 39.0–52.0)
HEMOGLOBIN: 15.2 g/dL (ref 13.0–17.0)
LYMPHS ABS: 2.9 10*3/uL (ref 0.7–4.0)
LYMPHS PCT: 42 %
MCH: 28.7 pg (ref 26.0–34.0)
MCHC: 34.2 g/dL (ref 30.0–36.0)
MCV: 84 fL (ref 78.0–100.0)
MONOS PCT: 7 %
Monocytes Absolute: 0.5 10*3/uL (ref 0.1–1.0)
NEUTROS PCT: 38 %
Neutro Abs: 2.6 10*3/uL (ref 1.7–7.7)
Platelets: 252 10*3/uL (ref 150–400)
RBC: 5.3 MIL/uL (ref 4.22–5.81)
RDW: 13.7 % (ref 11.5–15.5)
WBC: 6.8 10*3/uL (ref 4.0–10.5)

## 2016-03-12 MED ORDER — LORAZEPAM 1 MG PO TABS: 1.0000 mg | ORAL_TABLET | Freq: Two times a day (BID) | ORAL | 0 refills | Status: DC | PRN

## 2016-03-12 MED ORDER — LORAZEPAM 2 MG/ML IJ SOLN
1.0000 mg | Freq: Once | INTRAMUSCULAR | Status: AC
  Administered 2016-03-12: 1 mg via INTRAVENOUS
  Filled 2016-03-12: qty 1

## 2016-03-12 NOTE — ED Triage Notes (Signed)
Pt. reports panic attack/anxiety onset last week with palpitations / chest discomfort , left arm numbness and body shaking . Denies fever or chills.

## 2016-03-12 NOTE — ED Notes (Signed)
Pt resting, breathing back to normal

## 2016-03-12 NOTE — ED Notes (Signed)
Pt still shaking, very nervous about medication administration.  This RN stayed in room with pt after administration, and then encouraged pt to call his fiancee for comfort.  Pt on phone with fiancee at this time

## 2016-03-12 NOTE — ED Provider Notes (Signed)
[facility]PT Provider Note   CSN: [id number] Arrival date & time: 03/12/16  [id number]  By signing my name below, I, [name], attest that this documentation has been prepared under the direction and in the presence of Shon Batonourtney F Ansh Fauble, MD . Electronically Signed: [name], Scribe. 03/12/2016. 3:26 AM.    History   Chief Complaint Chief Complaint  Patient presents with  . Panic Attack  . Anxiety    HPI Kenneth Lane is a 30 y.o. male.  HPI   HPI Comments:  Kenneth Lane is a 30 y.o. male with Hx of asthma and bradycardia who presents to the Emergency Department complaining of a recent and worsening panic attack. Pt notes experiencing associated intermittent cold sweats both while awake and sleeping. Pt also notes waxing and waning pain and numbness in left arm radiating to hands. Pt denies CP or SOB. [facility] and thought to have a musculoskeletal left arm pain. He appears very anxious.  Past Medical History:  Diagnosis Date  . Asthma   . Bradycardia   . Seasonal allergies     There are no active problems to display for this patient.   Past Surgical History:  Procedure Laterality Date  . DENTAL SURGERY         Home Medications    Prior to Admission medications   Medication Sig Start Date End Date Taking? Authorizing Provider  HYDROcodone-acetaminophen (NORCO) 5-325 MG tablet Take 1 tablet by mouth every 4 (four) hours as needed. Patient not taking: Reported on 03/09/2016 01/26/16   Mancel BaleElliott Wentz, MD  LORazepam (ATIVAN) 1 MG tablet Take 1 tablet (1 mg total) by mouth 2 (two) times daily as needed for anxiety. 03/12/16   Shon Batonourtney F Ajani Schnieders, MD  naproxen (NAPROSYN) 500 MG tablet Take 1 tablet (500 mg total) by mouth 2 (two) times daily as needed. 03/06/16   Ace GinsSerena Y Sam, PA-C  ondansetron (ZOFRAN ODT) 4 MG disintegrating tablet Take 1 tablet (4 mg total) by mouth every 8 (eight) hours as needed for nausea or  vomiting. Patient not taking: Reported on 03/09/2016 02/26/16   Anselm PancoastShawn C Joy, PA-C    Family History Family History  Problem Relation Age of Onset  . Cancer Other   . Hypertension Other     Social History Social History  Substance Use Topics  . Smoking status: Current Every Day Smoker    Packs/day: 1.00    Types: Cigarettes  . Smokeless tobacco: Never Used  . Alcohol use Yes     Comment: stopped x 3 weeks     Allergies   Shellfish-derived products   Review of Systems Review of Systems  Constitutional: Positive for chills and diaphoresis.  Respiratory: Negative for shortness of breath.   Cardiovascular: Negative for chest pain.  Musculoskeletal: Positive for arthralgias (LUE).  Psychiatric/Behavioral:       Anxiety     Physical Exam Updated Vital Signs BP 111/77   Pulse (!) 56   Temp 98.2 F (36.8 C) (Oral)   Resp 19   Ht 5\' 5"  (1.651 m)   Wt 150 lb (68 kg)   SpO2 100%   BMI 24.96 kg/m   Physical Exam  Constitutional: He is oriented to person, place, and time. No distress.  Anxious, trembling, no acute distress  HENT:  Head: Normocephalic and atraumatic.  Cardiovascular: Regular rhythm and normal heart sounds.   No murmur heard. Tachycardia  Pulmonary/Chest: Effort normal and breath sounds normal. No  respiratory distress. He has no wheezes. He exhibits no tenderness.  Abdominal: Soft. There is no tenderness.  Musculoskeletal: Normal range of motion. He exhibits no edema.  Normal range of motion left arm, tenderness palpation along the proximal and distal arm over the musculature, no obvious deformities, 2+ radial pulse  Neurological: He is alert and oriented to person, place, and time.  Skin: Skin is warm and dry.  Psychiatric:  Anxious  Nursing note and vitals reviewed.    ED Treatments / Results  DIAGNOSTIC STUDIES:  Oxygen Saturation is 99% on room air, normal by my interpretation.    COORDINATION OF CARE:  1:19 AM Will order medication for  anxiety. Discussed treatment plan with pt at bedside and pt agreed to plan.  Labs (all labs ordered are listed, but only abnormal results are displayed) Labs Reviewed  CBC WITH DIFFERENTIAL/PLATELET - Abnormal; Notable for the following:       Result Value   Eosinophils Absolute 0.8 (*)    All other components within normal limits  BASIC METABOLIC PANEL - Abnormal; Notable for the following:    Glucose, Bld 102 (*)    All other components within normal limits  URINALYSIS, ROUTINE W REFLEX MICROSCOPIC (NOT AT [facility])  I-STAT TROPOININ, ED    EKG  EKG Interpretation  Date/Time:  Thursday March 12 2016 00:58:01 EDT Ventricular Rate:  71 PR Interval:  144 QRS Duration: 96 QT Interval:  400 QTC Calculation: 434 R Axis:   63 Text Interpretation:  Normal sinus rhythm ST elevation ~33mm V3, V4, t wave inversions inferiorly Repeat requested Confirmed by Akyla Vavrek  MD, Emiliano Welshans ([id number]) on 03/12/2016 1:28:35 AM       EKG Interpretation  Date/Time:  Thursday March 12 2016 01:16:52 EDT Ventricular Rate:  96 PR Interval:  144 QRS Duration: 99 QT Interval:  344 QTC Calculation: 435 R Axis:   57 Text Interpretation:  Sinus rhythm RSR' in V1 or V2, right VCD or RVH Confirmed by Tamiyah Moulin  MD, Sarkis Rhines ([id number]) on 03/12/2016 3:25:28 AM        Radiology No results found.  Procedures Procedures (including critical care time)  Medications Ordered in ED Medications  LORazepam (ATIVAN) injection 1 mg (1 mg Intravenous Given 03/12/16 0125)     Initial Impression / Assessment and Plan / ED Course  I have reviewed the triage vital signs and the nursing notes.  Pertinent labs & imaging results that were available during my care of the patient were reviewed by me and considered in my medical decision making (see chart for details).  Clinical Course   Patient presents with panic attack. Also reports continued left arm pain. He was initially brought back because his initial EKG was read as a  STEMI. He does not meet STEMI criteria. He is not having any chest pain. Repeat EKG in the room shows resolved minimal ST elevations in V3 and V2 4. Patient was given Ativan. Workup is largely reassuring. Left arm pain appears musculoskeletal. Patient reports that he has seen his primary physician but has not initiated anxiety medication as prescribed. I have encouraged him to do so.  After history, exam, and medical workup I feel the patient has been appropriately medically screened and is safe for discharge home. Pertinent diagnoses were discussed with the patient. Patient was given return precautions.   Final Clinical Impressions(s) / ED Diagnoses   Final diagnoses:  Anxiety    New Prescriptions New Prescriptions   LORAZEPAM (ATIVAN) 1 MG TABLET  Take 1 tablet (1 mg total) by mouth 2 (two) times daily as needed for anxiety.    I personally performed the services described in this documentation, which was scribed in my presence. The recorded information has been reviewed and is accurate.     Shon Batonourtney F Lempi Edwin, MD 03/12/16 [phone]

## 2016-03-14 ENCOUNTER — Emergency Department (HOSPITAL_COMMUNITY)
Admission: EM | Admit: 2016-03-14 | Discharge: 2016-03-14 | Disposition: A | Discharge status: Home / Self Care | Payer: Medicare Other | Attending: Emergency Medicine | Admitting: Emergency Medicine

## 2016-03-14 ENCOUNTER — Encounter (HOSPITAL_COMMUNITY): Payer: Self-pay | Admitting: *Deleted

## 2016-03-14 DIAGNOSIS — Z5321 Procedure and treatment not carried out due to patient leaving prior to being seen by health care provider: Secondary | ICD-10-CM | POA: Insufficient documentation

## 2016-03-14 DIAGNOSIS — F17210 Nicotine dependence, cigarettes, uncomplicated: Secondary | ICD-10-CM | POA: Diagnosis not present

## 2016-03-14 DIAGNOSIS — F419 Anxiety disorder, unspecified: Secondary | ICD-10-CM | POA: Diagnosis not present

## 2016-03-14 DIAGNOSIS — J45909 Unspecified asthma, uncomplicated: Secondary | ICD-10-CM | POA: Diagnosis not present

## 2016-03-14 DIAGNOSIS — R1110 Vomiting, unspecified: Secondary | ICD-10-CM | POA: Insufficient documentation

## 2016-03-14 NOTE — ED Notes (Signed)
Unable to locate patient, called in waiting area multiple times, no answer

## 2016-03-14 NOTE — ED Notes (Signed)
Called Pt to take to Pt room- No Answer

## 2016-03-14 NOTE — ED Triage Notes (Signed)
Pt discussed with dr Jeraldine Lootslockwood  No further labs etc needed

## 2016-03-14 NOTE — ED Notes (Signed)
Pt states he needs to go home really quick and he will be right back.

## 2016-03-14 NOTE — ED Triage Notes (Signed)
The pt has anxiety attacks since 1730 after he drank some lemonade he had bi-lateral face numbness with some swallowing difficulty  He also vomited   He reports chest pain also.  He was seen here 3 weeks ago for the same complaint and was diagnosed with anxiety

## 2016-03-18 ENCOUNTER — Emergency Department (HOSPITAL_COMMUNITY)
Admission: EM | Admit: 2016-03-18 | Discharge: 2016-03-18 | Disposition: A | Discharge status: Home / Self Care | Payer: Medicare Other | Attending: Emergency Medicine | Admitting: Emergency Medicine

## 2016-03-18 ENCOUNTER — Encounter (HOSPITAL_COMMUNITY): Payer: Self-pay | Admitting: Emergency Medicine

## 2016-03-18 ENCOUNTER — Emergency Department (HOSPITAL_COMMUNITY): Payer: Medicare Other

## 2016-03-18 DIAGNOSIS — R05 Cough: Secondary | ICD-10-CM | POA: Insufficient documentation

## 2016-03-18 DIAGNOSIS — J45909 Unspecified asthma, uncomplicated: Secondary | ICD-10-CM | POA: Diagnosis not present

## 2016-03-18 DIAGNOSIS — Z5321 Procedure and treatment not carried out due to patient leaving prior to being seen by health care provider: Secondary | ICD-10-CM | POA: Diagnosis not present

## 2016-03-18 DIAGNOSIS — F17210 Nicotine dependence, cigarettes, uncomplicated: Secondary | ICD-10-CM | POA: Insufficient documentation

## 2016-03-18 MED ORDER — ALBUTEROL SULFATE (2.5 MG/3ML) 0.083% IN NEBU
5.0000 mg | INHALATION_SOLUTION | Freq: Once | RESPIRATORY_TRACT | Status: AC
  Administered 2016-03-18: 5 mg via RESPIRATORY_TRACT

## 2016-03-18 MED ORDER — ALBUTEROL SULFATE (2.5 MG/3ML) 0.083% IN NEBU
INHALATION_SOLUTION | RESPIRATORY_TRACT | Status: AC
  Administered 2016-03-18: 5 mg via RESPIRATORY_TRACT
  Filled 2016-03-18: qty 6

## 2016-03-18 NOTE — ED Notes (Signed)
Pts name called for a room no answer 

## 2016-03-18 NOTE — ED Triage Notes (Signed)
Pt. reports cough , chest congestion / chest tightness and SOB  onset yesterday .

## 2016-05-07 ENCOUNTER — Emergency Department (HOSPITAL_COMMUNITY)
Admission: EM | Admit: 2016-05-07 | Discharge: 2016-05-07 | Disposition: A | Discharge status: Home / Self Care | Payer: Medicare Other | Attending: Emergency Medicine | Admitting: Emergency Medicine

## 2016-05-07 ENCOUNTER — Emergency Department (HOSPITAL_COMMUNITY): Payer: Medicare Other

## 2016-05-07 ENCOUNTER — Encounter (HOSPITAL_COMMUNITY): Payer: Self-pay | Admitting: Emergency Medicine

## 2016-05-07 DIAGNOSIS — R072 Precordial pain: Secondary | ICD-10-CM | POA: Insufficient documentation

## 2016-05-07 DIAGNOSIS — R109 Unspecified abdominal pain: Secondary | ICD-10-CM | POA: Diagnosis not present

## 2016-05-07 DIAGNOSIS — J45909 Unspecified asthma, uncomplicated: Secondary | ICD-10-CM | POA: Insufficient documentation

## 2016-05-07 DIAGNOSIS — R079 Chest pain, unspecified: Secondary | ICD-10-CM

## 2016-05-07 DIAGNOSIS — F17210 Nicotine dependence, cigarettes, uncomplicated: Secondary | ICD-10-CM | POA: Insufficient documentation

## 2016-05-07 LAB — I-STAT TROPONIN, ED: TROPONIN I, POC: 0 ng/mL (ref 0.00–0.08)

## 2016-05-07 LAB — BASIC METABOLIC PANEL
Anion gap: 9 (ref 5–15)
BUN: 8 mg/dL (ref 6–20)
CO2: 22 mmol/L (ref 22–32)
CREATININE: 0.8 mg/dL (ref 0.61–1.24)
Calcium: 9.5 mg/dL (ref 8.9–10.3)
Chloride: 108 mmol/L (ref 101–111)
GFR calc Af Amer: >60 mL/min (ref >60)
Glucose, Bld: 101 mg/dL — ABNORMAL HIGH (ref 65–99)
Potassium: 3.9 mmol/L (ref 3.5–5.1)
SODIUM: 139 mmol/L (ref 135–145)

## 2016-05-07 LAB — CBC
HCT: 42.5 % (ref 39.0–52.0)
Hemoglobin: 15 g/dL (ref 13.0–17.0)
MCH: 28.8 pg (ref 26.0–34.0)
MCHC: 35.3 g/dL (ref 30.0–36.0)
MCV: 81.6 fL (ref 78.0–100.0)
PLATELETS: 231 10*3/uL (ref 150–400)
RBC: 5.21 MIL/uL (ref 4.22–5.81)
RDW: 13.8 % (ref 11.5–15.5)
WBC: 7.5 10*3/uL (ref 4.0–10.5)

## 2016-05-07 NOTE — ED Triage Notes (Signed)
Patient with chest pain and shortness of breath that started at 10pm.  Patient states that he does not have any nausea or vomiting.  No sweating at this time.  Patient states that he is having a burning sensation in his abdomen with the chest pain.

## 2016-05-07 NOTE — ED Provider Notes (Signed)
[facility] Provider Note   CSN: [id number] Arrival date & time: 05/07/16  0028  By signing my name below, I, [name], attest that this documentation has been prepared under the direction and in the presence of [name], MD . Electronically Signed: [name], Scribe. 05/07/2016. 2:52 AM.   History   Chief Complaint Chief Complaint  Patient presents with  . Chest Pain  . Shortness of Breath    The history is provided by the patient. No language interpreter was used.  Chest Pain   This is a recurrent problem. The current episode started 3 to 5 hours ago. The problem has been gradually improving. The pain is associated with eating. The pain is present in the substernal region. Associated symptoms include abdominal pain and shortness of breath. Pertinent negatives include no diaphoresis and no vomiting. He has tried antacids for the symptoms. The treatment provided no relief. Risk factors include male gender.     HPI Comments:  Kenneth Lane is a 30 y.o. male who presents to the Emergency Department complaining of substernal chest pressure which began at 2200 while eating. He reports associated mid abdominal pain. Pt reports h/o same with eating but states he became concerned as he was having difficulty breathing with this episode He has taken Zantac without relief but notes his symptoms have improved at this time. He deneis fever chills diaphoresis, extremity swelling, vomiting and blood in his stool.  fam hx - mother with CAD  Past Medical History:  Diagnosis Date  . Asthma   . Bradycardia   . Seasonal allergies     There are no active problems to display for this patient.   Past Surgical History:  Procedure Laterality Date  . DENTAL SURGERY         Home Medications    Prior to Admission medications   Medication Sig Start Date End Date Taking? Authorizing Provider  LORazepam (ATIVAN) 1 MG tablet Take 1 tablet (1 mg total) by mouth 2 (two)  times daily as needed for anxiety. 03/12/16  Yes Shon Baton, MD  ranitidine (ZANTAC) 150 MG tablet Take 150 mg by mouth 2 (two) times daily as needed for heartburn.   Yes Historical Provider, MD    Family History Family History  Problem Relation Age of Onset  . Cancer Other   . Hypertension Other     Social History Social History  Substance Use Topics  . Smoking status: Current Every Day Smoker    Packs/day: 1.00    Types: Cigarettes  . Smokeless tobacco: Never Used  . Alcohol use Yes     Comment: stopped x 3 weeks     Allergies   Shellfish-derived products   Review of Systems Review of Systems  Constitutional: Negative for diaphoresis.  Respiratory: Positive for shortness of breath.   Cardiovascular: Positive for chest pain. Negative for leg swelling.  Gastrointestinal: Positive for abdominal pain. Negative for blood in stool and vomiting.  All other systems reviewed and are negative.    Physical Exam Updated Vital Signs BP 114/73   Pulse (!) 47   Temp 98.7 F (37.1 C) (Oral)   Resp 20   Ht  (1.651 m)   Wt 141 lb 5 oz (64.1 kg)   SpO2 98%   BMI 23.52 kg/m   Physical Exam  CONSTITUTIONAL: Well developed/well nourished HEAD: Normocephalic/atraumatic EYES: EOMI/PERRL ENMT: Mucous membranes moist NECK: supple no meningeal signs SPINE/BACK:entire spine nontender CV: S1/S2 noted, no murmurs/rubs/gallops noted LUNGS:  Lungs are clear to auscultation bilaterally, no apparent distress ABDOMEN: soft, nontender, no rebound or guarding, bowel sounds noted throughout abdomen GU:no cva tenderness NEURO: Pt is awake/alert/appropriate, moves all extremitiesx4.  No facial droop.   EXTREMITIES: pulses normal/equal, full ROM SKIN: warm, color normal PSYCH: no abnormalities of mood noted, alert and oriented to situation   ED Treatments / Results  DIAGNOSTIC STUDIES:  Oxygen Saturation is 98% on RA, normal by my interpretation.    COORDINATION OF  CARE:  2:46 AM Discussed treatment plan with pt at bedside and pt agreed to plan.  Labs (all labs ordered are listed, but only abnormal results are displayed) Labs Reviewed  BASIC METABOLIC PANEL - Abnormal; Notable for the following:       Result Value   Glucose, Bld 101 (*)    All other components within normal limits  CBC  I-STAT TROPOININ, ED    EKG  EKG Interpretation  Date/Time:  Thursday May 07 2016 00:41:20 EDT Ventricular Rate:  53 PR Interval:  144 QRS Duration: 98 QT Interval:  412 QTC Calculation: 386 R Axis:   58 Text Interpretation:  Sinus bradycardia with sinus arrhythmia Early repolarization Otherwise normal ECG No significant change since last tracing Confirmed by Bebe Shaggy  MD, Danyal Whitenack ([id number]) on 05/07/2016 2:09:59 AM       Radiology Dg Chest 2 View  Result Date: 05/07/2016 CLINICAL DATA:  Acute onset of midsternal chest pain and shortness of breath. Initial encounter. EXAM: CHEST  2 VIEW COMPARISON:  Chest radiograph performed 03/18/2016 FINDINGS: The lungs are well-aerated. Mild peribronchial thickening is noted. There is no evidence of focal opacification, pleural effusion or pneumothorax. The heart is normal in size; the mediastinal contour is within normal limits. No acute osseous abnormalities are seen. IMPRESSION: Mild peribronchial thickening noted.  Lungs otherwise grossly clear. Electronically Signed   By: Roanna Raider M.D.   On: 05/07/2016 01:34    Procedures Procedures   Medications Ordered in ED Medications - No data to display   Initial Impression / Assessment and Plan / ED Course  I have reviewed the triage vital signs and the nursing notes.  Pertinent labs & imaging results that were available during my care of the patient were reviewed by me and considered in my medical decision making (see chart for details).  Clinical Course    Pt well appearing He is already feeling improved He reports he has had this previously but felt  more SOB this time so he wanted to be evaluated Workup in the ED has been unremarkable He is requesting d/c home I doubt ACS He appears PERC negative I doubt dissection Will d/c home   Final Clinical Impressions(s) / ED Diagnoses   Final diagnoses:  Chest pain, unspecified type  Abdominal pain, unspecified abdominal location    New Prescriptions New Prescriptions   No medications on file   I personally performed the services described in this documentation, which was scribed in my presence. The recorded information has been reviewed and is accurate.        [name], MD 05/07/16 [phone]

## 2016-05-07 NOTE — Discharge Instructions (Signed)

## 2016-05-17 ENCOUNTER — Emergency Department (HOSPITAL_COMMUNITY): Payer: Medicare Other

## 2016-05-17 ENCOUNTER — Encounter (HOSPITAL_COMMUNITY): Payer: Self-pay | Admitting: Emergency Medicine

## 2016-05-17 ENCOUNTER — Emergency Department (HOSPITAL_COMMUNITY)
Admission: EM | Admit: 2016-05-17 | Discharge: 2016-05-17 | Disposition: A | Discharge status: Home / Self Care | Payer: Medicare Other | Attending: Emergency Medicine | Admitting: Emergency Medicine

## 2016-05-17 DIAGNOSIS — J45909 Unspecified asthma, uncomplicated: Secondary | ICD-10-CM | POA: Insufficient documentation

## 2016-05-17 DIAGNOSIS — J029 Acute pharyngitis, unspecified: Secondary | ICD-10-CM | POA: Diagnosis present

## 2016-05-17 DIAGNOSIS — F17210 Nicotine dependence, cigarettes, uncomplicated: Secondary | ICD-10-CM | POA: Insufficient documentation

## 2016-05-17 DIAGNOSIS — Z720 Tobacco use: Secondary | ICD-10-CM

## 2016-05-17 DIAGNOSIS — J069 Acute upper respiratory infection, unspecified: Secondary | ICD-10-CM

## 2016-05-17 LAB — RAPID STREP SCREEN (MED CTR MEBANE ONLY): Streptococcus, Group A Screen (Direct): NEGATIVE

## 2016-05-17 MED ORDER — AEROCHAMBER PLUS W/MASK MISC
1.0000 | Freq: Once | Status: AC
  Administered 2016-05-17: 1
  Filled 2016-05-17: qty 1

## 2016-05-17 MED ORDER — KETOROLAC TROMETHAMINE 30 MG/ML IJ SOLN
30.0000 mg | Freq: Once | INTRAMUSCULAR | Status: AC
  Administered 2016-05-17: 30 mg via INTRAMUSCULAR
  Filled 2016-05-17: qty 1

## 2016-05-17 MED ORDER — PREDNISONE 10 MG (21) PO TBPK: 10.0000 mg | ORAL_TABLET | Freq: Every day | ORAL | 0 refills | Status: DC

## 2016-05-17 MED ORDER — ALBUTEROL SULFATE HFA 108 (90 BASE) MCG/ACT IN AERS
1.0000 | INHALATION_SPRAY | RESPIRATORY_TRACT | Status: DC | PRN
  Administered 2016-05-17: 2 via RESPIRATORY_TRACT
  Filled 2016-05-17: qty 6.7

## 2016-05-17 MED ORDER — DEXAMETHASONE SODIUM PHOSPHATE 10 MG/ML IJ SOLN
10.0000 mg | Freq: Once | INTRAMUSCULAR | Status: AC
  Administered 2016-05-17: 10 mg via INTRAMUSCULAR
  Filled 2016-05-17: qty 1

## 2016-05-17 NOTE — ED Provider Notes (Signed)
[facility] Provider Note   CSN: [id number] Arrival date & time: 05/17/16  1478     History   Chief Complaint Chief Complaint  Patient presents with  . URI  . Sore Throat    HPI Kenneth Lane is a 30 y.o. male.  Pt presents to the ED with sore throat and cough.  The pt denies fever.  He smokes, but he has not been able to smoke for the past few days.        Past Medical History:  Diagnosis Date  . Asthma   . Bradycardia   . Seasonal allergies     There are no active problems to display for this patient.   Past Surgical History:  Procedure Laterality Date  . DENTAL SURGERY         Home Medications    Prior to Admission medications   Medication Sig Start Date End Date Taking? Authorizing Provider  ranitidine (ZANTAC) 150 MG tablet Take 150 mg by mouth 2 (two) times daily as needed for heartburn.   Yes Historical Provider, MD  LORazepam (ATIVAN) 1 MG tablet Take 1 tablet (1 mg total) by mouth 2 (two) times daily as needed for anxiety. Patient not taking: Reported on 05/17/2016 03/12/16   Shon Baton, MD  predniSONE (STERAPRED UNI-PAK 21 TAB) 10 MG (21) TBPK tablet Take 1 tablet (10 mg total) by mouth daily. Take 6 tabs by mouth daily  for 2 days, then 5 tabs for 2 days, then 4 tabs for 2 days, then 3 tabs for 2 days, 2 tabs for 2 days, then 1 tab by mouth daily for 2 days 05/17/16   Jacalyn Lefevre, MD    Family History Family History  Problem Relation Age of Onset  . Cancer Other   . Hypertension Other     Social History Social History  Substance Use Topics  . Smoking status: Current Every Day Smoker    Packs/day: 1.00    Types: Cigarettes  . Smokeless tobacco: Never Used  . Alcohol use Yes     Comment: stopped x 3 weeks     Allergies   Shellfish-derived products   Review of Systems Review of Systems  HENT: Positive for sore throat.   Respiratory: Positive for cough.   All other systems reviewed and are  negative.    Physical Exam Updated Vital Signs BP 121/80   Pulse (!) 52   Temp 97.7 F (36.5 C) (Oral)   Resp 18   Ht 5\' 5"  (1.651 m)   Wt 145 lb (65.8 kg)   SpO2 100%   BMI 24.13 kg/m   Physical Exam  Constitutional: He is oriented to person, place, and time. He appears well-developed and well-nourished.  HENT:  Head: Normocephalic and atraumatic.  Right Ear: External ear normal.  Left Ear: External ear normal.  Nose: Nose normal.  Mouth/Throat: Mucous membranes are dry.  Eyes: Conjunctivae and EOM are normal. Pupils are equal, round, and reactive to light.  Neck: Normal range of motion. Neck supple.  Cardiovascular: Normal rate, regular rhythm, normal heart sounds and intact distal pulses.   Pulmonary/Chest: Effort normal. He has wheezes.  Abdominal: Soft. Bowel sounds are normal.  Musculoskeletal: Normal range of motion.  Neurological: He is alert and oriented to person, place, and time.  Skin: Skin is warm.  Psychiatric: He has a normal mood and affect. His behavior is normal. Judgment and thought content normal.  Nursing note and vitals reviewed.    ED  Treatments / Results  Labs (all labs ordered are listed, but only abnormal results are displayed) Labs Reviewed  RAPID STREP SCREEN (NOT AT [facility])  CULTURE, GROUP A STREP [facility])    EKG  EKG Interpretation None       Radiology Dg Chest 2 View  Result Date: 05/17/2016 CLINICAL DATA:  Acute mid chest tightness, pain, shortness of breath and cough. History of asthma. EXAM: CHEST  2 VIEW COMPARISON:  05/07/2016 FINDINGS: Similar minor central peribronchial thickening and hyperinflation compatible with reactive airways disease or asthma. No focal pneumonia, collapse or consolidation. Negative for edema, effusion or pneumothorax. Trachea is midline. No acute osseous finding. Normal bowel gas pattern. IMPRESSION: Stable bronchitic changes and hyperinflation. No superimposed acute process. Electronically Signed   By:  Judie PetitM.  Shick M.D.   On: 05/17/2016 08:23    Procedures Procedures (including critical care time)  Medications Ordered in ED Medications  albuterol (PROVENTIL HFA;VENTOLIN HFA) 108 (90 Base) MCG/ACT inhaler 1-2 puff (2 puffs Inhalation Given 05/17/16 0714)  ketorolac (TORADOL) 30 MG/ML injection 30 mg (30 mg Intramuscular Given 05/17/16 0715)  dexamethasone (DECADRON) injection 10 mg (10 mg Intramuscular Given 05/17/16 0715)  aerochamber plus with mask device 1 each (1 each Other Given 05/17/16 0746)     Initial Impression / Assessment and Plan / ED Course  I have reviewed the triage vital signs and the nursing notes.  Pertinent labs & imaging results that were available during my care of the patient were reviewed by me and considered in my medical decision making (see chart for details).  Clinical Course    Pt is feeling better.  We talked about smoking cessation.  He agrees to try to quit.  He was given an albuterol inhaler with a spacer here.  He will be d/c'd home on prednisone to start tomorrow.  He knows to return if worse.  Final Clinical Impressions(s) / ED Diagnoses   Final diagnoses:  Upper respiratory tract infection, unspecified type  Viral pharyngitis  Tobacco abuse    New Prescriptions New Prescriptions   PREDNISONE (STERAPRED UNI-PAK 21 TAB) 10 MG (21) TBPK TABLET    Take 1 tablet (10 mg total) by mouth daily. Take 6 tabs by mouth daily  for 2 days, then 5 tabs for 2 days, then 4 tabs for 2 days, then 3 tabs for 2 days, 2 tabs for 2 days, then 1 tab by mouth daily for 2 days     Jacalyn LefevreJulie Usiel Astarita, MD 05/17/16 [phone]

## 2016-05-17 NOTE — ED Notes (Signed)
Patient transported to X-ray 

## 2016-05-17 NOTE — ED Triage Notes (Signed)
Pt reports sore throat and cough ongoing several days, states hx of asthma with no relief from home neb treatments. States no fevers at home. States productive cough with yellow phlegm.

## 2016-05-19 LAB — CULTURE, GROUP A STREP (THRC)

## 2016-07-07 ENCOUNTER — Emergency Department (HOSPITAL_COMMUNITY): Payer: Medicare Other

## 2016-07-07 ENCOUNTER — Emergency Department (HOSPITAL_COMMUNITY)
Admission: EM | Admit: 2016-07-07 | Discharge: 2016-07-07 | Disposition: A | Discharge status: Home / Self Care | Payer: Medicare Other | Attending: Emergency Medicine | Admitting: Emergency Medicine

## 2016-07-07 ENCOUNTER — Encounter (HOSPITAL_COMMUNITY): Payer: Self-pay | Admitting: Emergency Medicine

## 2016-07-07 DIAGNOSIS — R0789 Other chest pain: Secondary | ICD-10-CM

## 2016-07-07 DIAGNOSIS — J45909 Unspecified asthma, uncomplicated: Secondary | ICD-10-CM | POA: Insufficient documentation

## 2016-07-07 DIAGNOSIS — Z79899 Other long term (current) drug therapy: Secondary | ICD-10-CM | POA: Diagnosis not present

## 2016-07-07 DIAGNOSIS — F17210 Nicotine dependence, cigarettes, uncomplicated: Secondary | ICD-10-CM | POA: Diagnosis not present

## 2016-07-07 DIAGNOSIS — R079 Chest pain, unspecified: Secondary | ICD-10-CM | POA: Diagnosis present

## 2016-07-07 LAB — BASIC METABOLIC PANEL
ANION GAP: 8 (ref 5–15)
BUN: 9 mg/dL (ref 6–20)
CHLORIDE: 106 mmol/L (ref 101–111)
CO2: 26 mmol/L (ref 22–32)
Calcium: 9.8 mg/dL (ref 8.9–10.3)
Creatinine, Ser: 1 mg/dL (ref 0.61–1.24)
GFR calc Af Amer: >60 mL/min (ref >60)
Glucose, Bld: 112 mg/dL — ABNORMAL HIGH (ref 65–99)
POTASSIUM: 4.7 mmol/L (ref 3.5–5.1)
SODIUM: 140 mmol/L (ref 135–145)

## 2016-07-07 LAB — CBC
HEMATOCRIT: 43.8 % (ref 39.0–52.0)
HEMOGLOBIN: 15.4 g/dL (ref 13.0–17.0)
MCH: 28.9 pg (ref 26.0–34.0)
MCHC: 35.2 g/dL (ref 30.0–36.0)
MCV: 82.2 fL (ref 78.0–100.0)
Platelets: 224 10*3/uL (ref 150–400)
RBC: 5.33 MIL/uL (ref 4.22–5.81)
RDW: 14 % (ref 11.5–15.5)
WBC: 6.3 10*3/uL (ref 4.0–10.5)

## 2016-07-07 LAB — TROPONIN I: Troponin I: <0.03 ng/mL (ref <0.03)

## 2016-07-07 MED ORDER — NAPROXEN 250 MG PO TABS
500.0000 mg | ORAL_TABLET | Freq: Once | ORAL | Status: AC
  Administered 2016-07-07: 500 mg via ORAL
  Filled 2016-07-07: qty 2

## 2016-07-07 MED ORDER — NAPROXEN 500 MG PO TABS: 500.0000 mg | ORAL_TABLET | Freq: Two times a day (BID) | ORAL | 0 refills | Status: DC

## 2016-07-07 NOTE — ED Triage Notes (Signed)
Pt. reports intermittent central chest pain for 1 week with SOB worse when lying on bed , denies emesis or diaphoresis , occasional dry cough .

## 2016-07-07 NOTE — Discharge Instructions (Signed)
You were seen today for chest pain. Your heart tests are reassuring. Follow-up with your primary physician if pain does not improve.

## 2016-07-07 NOTE — ED Provider Notes (Signed)
[facility] Provider Note   CSN: [id number] Arrival date & time: 07/07/16  0113     History   Chief Complaint Chief Complaint  Patient presents with  . Chest Pain    HPI Kenneth Lane is a 30 y.o. male.  HPI  This is a 30 year old male who presents with chest pain. Patient reports one-week history of intermittent left-sided sharp chest pain. It comes and goes. Not associated with breathing.  Currently his chest pain is 0 out of 10. Reports some shortness of breath when lying flat. Denies any cough or fevers. Is an every day smoker. No recent fevers or upper respiratory symptoms.  Past Medical History:  Diagnosis Date  . Asthma   . Bradycardia   . Seasonal allergies     There are no active problems to display for this patient.   Past Surgical History:  Procedure Laterality Date  . DENTAL SURGERY         Home Medications    Prior to Admission medications   Medication Sig Start Date End Date Taking? Authorizing Provider  LORazepam (ATIVAN) 1 MG tablet Take 1 tablet (1 mg total) by mouth 2 (two) times daily as needed for anxiety. Patient not taking: Reported on 05/17/2016 03/12/16   Shon Batonourtney F Kemiya Batdorf, MD  naproxen (NAPROSYN) 500 MG tablet Take 1 tablet (500 mg total) by mouth 2 (two) times daily. 07/07/16   Shon Batonourtney F Champ Keetch, MD  predniSONE (STERAPRED UNI-PAK 21 TAB) 10 MG (21) TBPK tablet Take 1 tablet (10 mg total) by mouth daily. Take 6 tabs by mouth daily  for 2 days, then 5 tabs for 2 days, then 4 tabs for 2 days, then 3 tabs for 2 days, 2 tabs for 2 days, then 1 tab by mouth daily for 2 days 05/17/16   Jacalyn LefevreJulie Haviland, MD  ranitidine (ZANTAC) 150 MG tablet Take 150 mg by mouth 2 (two) times daily as needed for heartburn.    Historical Provider, MD    Family History Family History  Problem Relation Age of Onset  . Cancer Other   . Hypertension Other     Social History Social History  Substance Use Topics  . Smoking status: Current Every Day  Smoker    Packs/day: 1.00    Types: Cigarettes  . Smokeless tobacco: Never Used  . Alcohol use Yes     Comment: stopped x 3 weeks     Allergies   Shellfish-derived products   Review of Systems Review of Systems  Constitutional: Negative for fever.  Respiratory: Positive for shortness of breath. Negative for cough.   Cardiovascular: Positive for chest pain. Negative for leg swelling.  Gastrointestinal: Negative for abdominal pain, nausea and vomiting.  All other systems reviewed and are negative.    Physical Exam Updated Vital Signs BP 113/79   Pulse (!) 49   Temp 97.9 F (36.6 C) (Oral)   Resp 17   Ht 5\' 5"  (1.651 m)   Wt 140 lb (63.5 kg)   SpO2 99%   BMI 23.30 kg/m   Physical Exam  Constitutional: He is oriented to person, place, and time. He appears well-developed and well-nourished. No distress.  HENT:  Head: Normocephalic and atraumatic.  Eyes: Pupils are equal, round, and reactive to light.  Neck: Neck supple.  Cardiovascular: Normal rate, regular rhythm, normal heart sounds and intact distal pulses.   No murmur heard. Pulmonary/Chest: Effort normal and breath sounds normal. No respiratory distress. He has no wheezes. He exhibits no tenderness.  Abdominal: Soft. Bowel sounds are normal. There is no tenderness. There is no rebound.  Musculoskeletal: He exhibits no edema.  Lymphadenopathy:    He has no cervical adenopathy.  Neurological: He is alert and oriented to person, place, and time.  Skin: Skin is warm and dry.  Psychiatric: He has a normal mood and affect.  Nursing note and vitals reviewed.    ED Treatments / Results  Labs (all labs ordered are listed, but only abnormal results are displayed) Labs Reviewed  BASIC METABOLIC PANEL - Abnormal; Notable for the following:       Result Value   Glucose, Bld 112 (*)    All other components within normal limits  CBC  TROPONIN I  I-STAT TROPOININ, ED    EKG  EKG  Interpretation  Date/Time:  Tuesday July 07 2016 01:28:38 EST Ventricular Rate:  75 PR Interval:  144 QRS Duration: 96 QT Interval:  374 QTC Calculation: 417 R Axis:   63 Text Interpretation:  Normal sinus rhythm with sinus arrhythmia Nonspecific ST and T wave abnormality Abnormal ECG Early repolarization No significant change since last tracing Confirmed by Jaquel Glassburn  MD, Finley Dinkel ([id number]) on 07/07/2016 4:48:24 AM       Radiology Dg Chest 2 View  Result Date: 07/07/2016 CLINICAL DATA:  30 y/o M; left-sided chest pain for 1 week worse tonight. EXAM: CHEST  2 VIEW COMPARISON:  05/17/2016 chest radiograph FINDINGS: The heart size and mediastinal contours are within normal limits and stable. Both lungs are clear. The visualized skeletal structures are unremarkable. IMPRESSION: No active cardiopulmonary disease. Electronically Signed   By: Mitzi HansenLance  Furusawa-Stratton M.D.   On: 07/07/2016 01:50    Procedures Procedures (including critical care time)    EMERGENCY DEPARTMENT US CARDIAC EXAM "Study: Limited Ultrasound of the heart and pericardium"  INDICATIONS:Dyspnea Multiple views of the heart and pericardium are obtained with a multi-frequency probe.  PERFORMED UE:[id number]:Myself  IMAGES ARCHIVED?: Yes  FINDINGS: No pericardial effusion  LIMITATIONS:  Emergent procedure  VIEWS USED: Parasternal long axis and Parasternal short axis  INTERPRETATION: Pericardial effusioin absent  COMMENT:  Normal squeeze, pericardial effusion absent   Medications Ordered in ED Medications  naproxen (NAPROSYN) tablet 500 mg (not administered)     Initial Impression / Assessment and Plan / ED Course  I have reviewed the triage vital signs and the nursing notes.  Pertinent labs & imaging results that were available during my care of the patient were reviewed by me and considered in my medical decision making (see chart for details).  Clinical Course    Patient presents for chest pain. Atypical.  Ongoing for one week. Currently chest pain-free. He is PERC negative. ACS unlikely. Troponin and EKG reassuring.  Bedside ultrasound without significant pericardial effusion. Will treat with anti-inflammatories. Follow-up with cardiology and primary physician if not improving.  After history, exam, and medical workup I feel the patient has been appropriately medically screened and is safe for discharge home. Pertinent diagnoses were discussed with the patient. Patient was given return precautions.   Final Clinical Impressions(s) / ED Diagnoses   Final diagnoses:  Atypical chest pain    New Prescriptions New Prescriptions   NAPROXEN (NAPROSYN) 500 MG TABLET    Take 1 tablet (500 mg total) by mouth 2 (two) times daily.     Shon Batonourtney F Desere Gwin, MD 07/07/16 [phone]

## 2016-07-07 NOTE — ED Notes (Signed)
Called main lab to add on troponin.  

## 2016-08-07 ENCOUNTER — Encounter (HOSPITAL_COMMUNITY): Payer: Self-pay | Admitting: *Deleted

## 2016-08-07 ENCOUNTER — Ambulatory Visit (HOSPITAL_COMMUNITY)
Admission: EM | Admit: 2016-08-07 | Discharge: 2016-08-07 | Disposition: A | Discharge status: Home / Self Care | Payer: Medicare Other | Attending: Family Medicine | Admitting: Family Medicine

## 2016-08-07 DIAGNOSIS — R0789 Other chest pain: Secondary | ICD-10-CM | POA: Diagnosis not present

## 2016-08-07 MED ORDER — DICLOFENAC POTASSIUM 50 MG PO TABS: 50.0000 mg | ORAL_TABLET | Freq: Three times a day (TID) | ORAL | 0 refills | Status: DC

## 2016-08-07 NOTE — ED Triage Notes (Signed)
Pt reports  Sharp  intermittant  Chest  Pain  Which   Comes   And  goses   Since  Yesterday       Pt  Sitting  Upright  On the  Exam table  In no  Acute  Distress

## 2016-08-07 NOTE — ED Provider Notes (Signed)
[facility]    CSN: [id number] Arrival date & time: 08/07/16  1638     History   Chief Complaint Chief Complaint  Patient presents with  . Chest Pain    HPI Kenneth Lane is a 31 y.o. male.   The history is provided by the patient.  Chest Pain  Pain location:  L lateral chest Pain quality: sharp   Pain radiates to:  Does not radiate Pain severity:  Mild Onset quality:  Sudden Duration:  1 day Progression:  Unchanged Chronicity:  New Context comment:  Coughing and smoking cigs at time of onset. Relieved by:  None tried Worsened by:  Nothing Ineffective treatments:  None tried Associated symptoms: cough   Associated symptoms: no fever, no palpitations and no shortness of breath   Risk factors: male sex and smoking     Past Medical History:  Diagnosis Date  . Asthma   . Bradycardia   . Seasonal allergies     There are no active problems to display for this patient.   Past Surgical History:  Procedure Laterality Date  . DENTAL SURGERY         Home Medications    Prior to Admission medications   Medication Sig Start Date End Date Taking? Authorizing Provider  LORazepam (ATIVAN) 1 MG tablet Take 1 tablet (1 mg total) by mouth 2 (two) times daily as needed for anxiety. Patient not taking: Reported on 05/17/2016 03/12/16   Shon Batonourtney F Horton, MD  naproxen (NAPROSYN) 500 MG tablet Take 1 tablet (500 mg total) by mouth 2 (two) times daily. 07/07/16   Shon Batonourtney F Horton, MD  predniSONE (STERAPRED UNI-PAK 21 TAB) 10 MG (21) TBPK tablet Take 1 tablet (10 mg total) by mouth daily. Take 6 tabs by mouth daily  for 2 days, then 5 tabs for 2 days, then 4 tabs for 2 days, then 3 tabs for 2 days, 2 tabs for 2 days, then 1 tab by mouth daily for 2 days 05/17/16   Jacalyn LefevreJulie Haviland, MD  ranitidine (ZANTAC) 150 MG tablet Take 150 mg by mouth 2 (two) times daily as needed for heartburn.    Historical Provider, MD    Family History Family History  Problem Relation  Age of Onset  . Cancer Other   . Hypertension Other     Social History Social History  Substance Use Topics  . Smoking status: Current Every Day Smoker    Packs/day: 1.00    Types: Cigarettes  . Smokeless tobacco: Never Used  . Alcohol use Yes     Comment: stopped x 3 weeks     Allergies   Shellfish-derived products   Review of Systems Review of Systems  Constitutional: Negative for fever.  HENT: Negative.   Respiratory: Positive for cough. Negative for shortness of breath.   Cardiovascular: Positive for chest pain. Negative for palpitations.  Gastrointestinal: Negative.      Physical Exam Triage Vital Signs ED Triage Vitals  Enc Vitals Group     BP 08/07/16 1727 115/60     Pulse Rate 08/07/16 1727 62     Resp 08/07/16 1727 18     Temp 08/07/16 1727 98.3 F (36.8 C)     Temp src --      SpO2 08/07/16 1727 99 %     Weight --      Height --      Head Circumference --      Peak Flow --  Pain Score 08/07/16 1729 7     Pain Loc --      Pain Edu? --      Excl. in GC? --    No data found.   Updated Vital Signs BP 115/60 (BP Location: Right Arm)   Pulse 62   Temp 98.3 F (36.8 C)   Resp 18   SpO2 99%   Visual Acuity Right Eye Distance:   Left Eye Distance:   Bilateral Distance:    Right Eye Near:   Left Eye Near:    Bilateral Near:     Physical Exam  Constitutional: He is oriented to person, place, and time. He appears well-developed and well-nourished. No distress.  HENT:  Mouth/Throat: Oropharynx is clear and moist.  Eyes: Conjunctivae are normal. Pupils are equal, round, and reactive to light.  Neck: Normal range of motion. Neck supple.  Cardiovascular: Normal rate and regular rhythm.   Pulmonary/Chest: Effort normal and breath sounds normal. He exhibits tenderness.  Abdominal: Soft. Bowel sounds are normal.  Lymphadenopathy:    He has no cervical adenopathy.  Neurological: He is alert and oriented to person, place, and time.  Skin:  Skin is warm and dry.  Nursing note and vitals reviewed.    UC Treatments / Results  Labs (all labs ordered are listed, but only abnormal results are displayed) Labs Reviewed - No data to display  EKG  EKG Interpretation None       Radiology No results found.  Procedures Procedures (including critical care time)  Medications Ordered in UC Medications - No data to display   Initial Impression / Assessment and Plan / UC Course  I have reviewed the triage vital signs and the nursing notes.  Pertinent labs & imaging results that were available during my care of the patient were reviewed by me and considered in my medical decision making (see chart for details).  Clinical Course   [id number].    Final Clinical Impressions(s) / UC Diagnoses   Final diagnoses:  None    New Prescriptions New Prescriptions   No medications on file     Linna Hoff, MD 08/07/16 [phone]

## 2016-08-20 DIAGNOSIS — F321 Major depressive disorder, single episode, moderate: Secondary | ICD-10-CM | POA: Diagnosis not present

## 2016-08-21 DIAGNOSIS — F321 Major depressive disorder, single episode, moderate: Secondary | ICD-10-CM | POA: Diagnosis not present

## 2016-11-14 ENCOUNTER — Encounter (HOSPITAL_COMMUNITY): Payer: Self-pay | Admitting: Emergency Medicine

## 2016-11-14 ENCOUNTER — Emergency Department (HOSPITAL_COMMUNITY)
Admission: EM | Admit: 2016-11-14 | Discharge: 2016-11-14 | Disposition: A | Discharge status: Home / Self Care | Payer: Medicare Other | Attending: Emergency Medicine | Admitting: Emergency Medicine

## 2016-11-14 ENCOUNTER — Emergency Department (HOSPITAL_COMMUNITY): Payer: Medicare Other

## 2016-11-14 DIAGNOSIS — Y929 Unspecified place or not applicable: Secondary | ICD-10-CM | POA: Insufficient documentation

## 2016-11-14 DIAGNOSIS — W231XXA Caught, crushed, jammed, or pinched between stationary objects, initial encounter: Secondary | ICD-10-CM | POA: Insufficient documentation

## 2016-11-14 DIAGNOSIS — M79645 Pain in left finger(s): Secondary | ICD-10-CM | POA: Diagnosis not present

## 2016-11-14 DIAGNOSIS — Y9389 Activity, other specified: Secondary | ICD-10-CM | POA: Insufficient documentation

## 2016-11-14 DIAGNOSIS — S6992XA Unspecified injury of left wrist, hand and finger(s), initial encounter: Secondary | ICD-10-CM | POA: Insufficient documentation

## 2016-11-14 DIAGNOSIS — Y990 Civilian activity done for income or pay: Secondary | ICD-10-CM | POA: Insufficient documentation

## 2016-11-14 DIAGNOSIS — Z5321 Procedure and treatment not carried out due to patient leaving prior to being seen by health care provider: Secondary | ICD-10-CM | POA: Insufficient documentation

## 2016-11-14 NOTE — ED Triage Notes (Signed)
Reports smashing left ring finger on some wood at work.

## 2016-11-14 NOTE — ED Notes (Signed)
Called to come back to room. No answer.

## 2016-11-14 NOTE — ED Notes (Signed)
PT not in waiting room 

## 2017-01-21 ENCOUNTER — Other Ambulatory Visit: Payer: Self-pay

## 2017-01-21 ENCOUNTER — Emergency Department (HOSPITAL_COMMUNITY)
Admission: EM | Admit: 2017-01-21 | Discharge: 2017-01-22 | Disposition: A | Discharge status: Home / Self Care | Payer: Medicare Other | Attending: Emergency Medicine | Admitting: Emergency Medicine

## 2017-01-21 ENCOUNTER — Encounter (HOSPITAL_COMMUNITY): Payer: Self-pay | Admitting: Emergency Medicine

## 2017-01-21 DIAGNOSIS — R062 Wheezing: Secondary | ICD-10-CM | POA: Diagnosis not present

## 2017-01-21 DIAGNOSIS — F17210 Nicotine dependence, cigarettes, uncomplicated: Secondary | ICD-10-CM | POA: Insufficient documentation

## 2017-01-21 DIAGNOSIS — F419 Anxiety disorder, unspecified: Secondary | ICD-10-CM | POA: Diagnosis not present

## 2017-01-21 DIAGNOSIS — J45909 Unspecified asthma, uncomplicated: Secondary | ICD-10-CM | POA: Insufficient documentation

## 2017-01-21 DIAGNOSIS — R079 Chest pain, unspecified: Secondary | ICD-10-CM | POA: Diagnosis not present

## 2017-01-21 DIAGNOSIS — R0789 Other chest pain: Secondary | ICD-10-CM | POA: Insufficient documentation

## 2017-01-21 DIAGNOSIS — Z79899 Other long term (current) drug therapy: Secondary | ICD-10-CM | POA: Insufficient documentation

## 2017-01-21 HISTORY — DX: Anxiety disorder, unspecified: F41.9

## 2017-01-21 NOTE — ED Triage Notes (Signed)
Pt arrives via EMS for chest pain and panic attack that he woke up with at 2300. Pt woke then drove to [employer], he says eating didn't help with pain. Pt states he is pain free at this time. States he has hx of anxiety and asthma. Reports smoking "a lot." Cessation education provided. EMS initated IV access and gave 324 MG aspirin and 1SL nitro. Pt appears anxious, but states pain is resolved.

## 2017-01-22 ENCOUNTER — Emergency Department (HOSPITAL_COMMUNITY): Payer: Medicare Other

## 2017-01-22 DIAGNOSIS — R062 Wheezing: Secondary | ICD-10-CM | POA: Diagnosis not present

## 2017-01-22 DIAGNOSIS — R0789 Other chest pain: Secondary | ICD-10-CM | POA: Diagnosis not present

## 2017-01-22 LAB — CBC
HCT: 42.7 % (ref 39.0–52.0)
Hemoglobin: 15.1 g/dL (ref 13.0–17.0)
MCH: 30 pg (ref 26.0–34.0)
MCHC: 35.4 g/dL (ref 30.0–36.0)
MCV: 84.9 fL (ref 78.0–100.0)
PLATELETS: 228 10*3/uL (ref 150–400)
RBC: 5.03 MIL/uL (ref 4.22–5.81)
RDW: 14.2 % (ref 11.5–15.5)
WBC: 7.1 10*3/uL (ref 4.0–10.5)

## 2017-01-22 LAB — BASIC METABOLIC PANEL
Anion gap: 8 (ref 5–15)
BUN: 11 mg/dL (ref 6–20)
CHLORIDE: 108 mmol/L (ref 101–111)
CO2: 25 mmol/L (ref 22–32)
CREATININE: 1 mg/dL (ref 0.61–1.24)
Calcium: 9.1 mg/dL (ref 8.9–10.3)
GFR calc Af Amer: >60 mL/min (ref >60)
GFR calc non Af Amer: >60 mL/min (ref >60)
Glucose, Bld: 109 mg/dL — ABNORMAL HIGH (ref 65–99)
Potassium: 3.5 mmol/L (ref 3.5–5.1)
SODIUM: 141 mmol/L (ref 135–145)

## 2017-01-22 LAB — I-STAT TROPONIN, ED: Troponin i, poc: 0 ng/mL (ref 0.00–0.08)

## 2017-01-22 MED ORDER — LORAZEPAM 1 MG PO TABS: 1.0000 mg | ORAL_TABLET | Freq: Three times a day (TID) | ORAL | 0 refills | Status: DC | PRN

## 2017-01-22 NOTE — ED Notes (Signed)
Patient transported to X-ray 

## 2017-01-22 NOTE — ED Provider Notes (Signed)
[facility] Provider Note   CSN: [id number] Arrival date & time: 01/21/17  2352  By signing my name below, I, Rosana Fret, attest that this documentation has been prepared under the direction and in the presence of Dione Booze, MD. Electronically Signed: Rosana Fret, ED Scribe. 01/22/17. 1:00 AM.  History   Chief Complaint Chief Complaint  Patient presents with  . Anxiety  . Chest Pain   The history is provided by the patient. No language interpreter was used.   HPI Comments: Kenneth Lane is a 31 y.o. male with a PMHx of asthma and anxiety, who presents to the Emergency Department via EMS complaining of a sudden onset panic attack that occurred 2 hours ago. Pt reports associated diaphoresis, CP, dry mouth, and lightheadedness. Pt endorses a hx of similar symptoms. Pt has tried his inhaler with no relief. Pt states his symptoms have resolved at this time. No other complaints at this time.  Past Medical History:  Diagnosis Date  . Anxiety   . Asthma   . Bradycardia   . Seasonal allergies     There are no active problems to display for this patient.   Past Surgical History:  Procedure Laterality Date  . DENTAL SURGERY         Home Medications    Prior to Admission medications   Medication Sig Start Date End Date Taking? Authorizing Provider  diclofenac (CATAFLAM) 50 MG tablet Take 1 tablet (50 mg total) by mouth 3 (three) times daily. Prn chest pain 08/07/16   Linna Hoff, MD  LORazepam (ATIVAN) 1 MG tablet Take 1 tablet (1 mg total) by mouth 2 (two) times daily as needed for anxiety. Patient not taking: Reported on 05/17/2016 03/12/16   Horton, Mayer Masker, MD  naproxen (NAPROSYN) 500 MG tablet Take 1 tablet (500 mg total) by mouth 2 (two) times daily. 07/07/16   Horton, Mayer Masker, MD  predniSONE (STERAPRED UNI-PAK 21 TAB) 10 MG (21) TBPK tablet Take 1 tablet (10 mg total) by mouth daily. Take 6 tabs by mouth daily  for 2 days, then 5 tabs for 2  days, then 4 tabs for 2 days, then 3 tabs for 2 days, 2 tabs for 2 days, then 1 tab by mouth daily for 2 days 05/17/16   Jacalyn Lefevre, MD  ranitidine (ZANTAC) 150 MG tablet Take 150 mg by mouth 2 (two) times daily as needed for heartburn.    [provider]    Family History Family History  Problem Relation Age of Onset  . Cancer Other   . Hypertension Other     Social History Social History  Substance Use Topics  . Smoking status: Current Every Day Smoker    Packs/day: 1.00    Types: Cigarettes  . Smokeless tobacco: Never Used  . Alcohol use Yes     Comment: stopped x 3 weeks     Allergies   Shellfish-derived products   Review of Systems Review of Systems  Constitutional: Positive for diaphoresis.  Cardiovascular: Positive for chest pain.  Neurological: Positive for light-headedness.  All other systems reviewed and are negative.    Physical Exam Updated Vital Signs BP 120/79 (BP Location: Left Arm)   Pulse 61   Temp 97.9 F (36.6 C) (Oral)   Resp 16   Ht 5\' 5"  (1.651 m)   Wt 135 lb (61.2 kg)   SpO2 99%   BMI 22.47 kg/m   Physical Exam  Constitutional: He is oriented to person, place,  and time. He appears well-developed and well-nourished.  HENT:  Head: Normocephalic and atraumatic.  Eyes: EOM are normal. Pupils are equal, round, and reactive to light.  Neck: Normal range of motion. Neck supple. No JVD present.  Cardiovascular: Normal rate and regular rhythm.   Murmur heard. 1/6 systolic ejection murmur at the left sternal border.   Pulmonary/Chest: Effort normal and breath sounds normal. He has no wheezes. He has no rales. He exhibits no tenderness.  Abdominal: Soft. Bowel sounds are normal. He exhibits no distension and no mass. There is no tenderness.  Musculoskeletal: Normal range of motion. He exhibits no edema.  Lymphadenopathy:    He has no cervical adenopathy.  Neurological: He is alert and oriented to person, place, and time. No  cranial nerve deficit. He exhibits normal muscle tone. Coordination normal.  Skin: Skin is warm and dry. No rash noted.  Psychiatric: He has a normal mood and affect. His behavior is normal. Judgment and thought content normal.  Nursing note and vitals reviewed.    ED Treatments / Results  DIAGNOSTIC STUDIES: Oxygen Saturation is 99% on RA, normal by my interpretation.   COORDINATION OF CARE: 12:58 AM-Discussed next steps with pt including a prescription for an anxiety medication. Pt verbalized understanding and is agreeable with the plan.   Labs (all labs ordered are listed, but only abnormal results are displayed) Labs Reviewed  BASIC METABOLIC PANEL  CBC  I-STAT TROPOININ, ED    EKG  EKG Interpretation  Date/Time:  Thursday January 21 2017 23:47:39 EDT Ventricular Rate:  68 PR Interval:  146 QRS Duration: 100 QT Interval:  406 QTC Calculation: 431 R Axis:   59 Text Interpretation:  Normal sinus rhythm Normal ECG When compared with ECG of 07/07/2016, Nonspecific ST and T wave abnormality is no longer present Confirmed by Dione BoozeGlick, Annarae Macnair ([id number]) on 01/22/2017 12:04:45 AM       Radiology Dg Chest 2 View  Result Date: 01/22/2017 CLINICAL DATA:  Wheezing and anxiety tonight.  History of asthma EXAM: CHEST  2 VIEW COMPARISON:  07/07/2016 FINDINGS: The lungs are clear. The pulmonary vasculature is normal. Heart size is normal. Hilar and mediastinal contours are unremarkable. There is no pleural effusion. IMPRESSION: No active cardiopulmonary disease. Electronically Signed   By: Ellery Plunkaniel R Mitchell M.D.   On: 01/22/2017 00:52    Procedures Procedures (including critical care time)  Medications Ordered in ED Medications - No data to display   Initial Impression / Assessment and Plan / ED Course  I have reviewed the triage vital signs and the nursing notes.  Pertinent labs & imaging results that were available during my care of the patient were reviewed by me and considered in  my medical decision making (see chart for details).  Chest pain which seems to be part of anxiety/panic attack. He is pain-free currently. ECG is unremarkable. Screening labs are obtained and are negative including normal troponin. Chest x-ray is normal. No retroflexed to suggest more serious condition. He is discharged with a prescription for lorazepam to use as needed. Also given resource guide to try to help him find her my care provider.  Final Clinical Impressions(s) / ED Diagnoses   Final diagnoses:  Anxiety  Atypical chest pain    New Prescriptions Current Discharge Medication List     I personally performed the services described in this documentation, which was scribed in my presence. The recorded information has been reviewed and is accurate.       Dione BoozeGlick, Kharizma Lesnick, MD  01/22/17 0235  

## 2017-01-22 NOTE — ED Notes (Signed)
ED Provider at bedside. 

## 2017-01-22 NOTE — ED Notes (Signed)
Patient Alert and oriented X4. Stable and ambulatory. Patient verbalized understanding of the discharge instructions.  Patient belongings were taken by the patient.  

## 2017-05-14 ENCOUNTER — Emergency Department (HOSPITAL_COMMUNITY)
Admission: EM | Admit: 2017-05-14 | Discharge: 2017-05-14 | Disposition: A | Discharge status: Home / Self Care | Payer: Medicare Other | Attending: Emergency Medicine | Admitting: Emergency Medicine

## 2017-05-14 ENCOUNTER — Encounter (HOSPITAL_COMMUNITY): Payer: Self-pay | Admitting: Emergency Medicine

## 2017-05-14 DIAGNOSIS — Z7251 High risk heterosexual behavior: Secondary | ICD-10-CM | POA: Diagnosis not present

## 2017-05-14 DIAGNOSIS — Z79899 Other long term (current) drug therapy: Secondary | ICD-10-CM | POA: Diagnosis not present

## 2017-05-14 DIAGNOSIS — N390 Urinary tract infection, site not specified: Secondary | ICD-10-CM | POA: Diagnosis not present

## 2017-05-14 DIAGNOSIS — F17210 Nicotine dependence, cigarettes, uncomplicated: Secondary | ICD-10-CM | POA: Insufficient documentation

## 2017-05-14 DIAGNOSIS — R8281 Pyuria: Secondary | ICD-10-CM

## 2017-05-14 DIAGNOSIS — R300 Dysuria: Secondary | ICD-10-CM | POA: Diagnosis present

## 2017-05-14 DIAGNOSIS — R360 Urethral discharge without blood: Secondary | ICD-10-CM | POA: Insufficient documentation

## 2017-05-14 DIAGNOSIS — J45909 Unspecified asthma, uncomplicated: Secondary | ICD-10-CM | POA: Diagnosis not present

## 2017-05-14 LAB — URINALYSIS, ROUTINE W REFLEX MICROSCOPIC
BACTERIA UA: NONE SEEN
BILIRUBIN URINE: NEGATIVE
Glucose, UA: NEGATIVE mg/dL
HGB URINE DIPSTICK: NEGATIVE
KETONES UR: NEGATIVE mg/dL
NITRITE: NEGATIVE
PROTEIN: NEGATIVE mg/dL
Specific Gravity, Urine: 1.016 (ref 1.005–1.030)
Squamous Epithelial / LPF: NONE SEEN
pH: 7 (ref 5.0–8.0)

## 2017-05-14 MED ORDER — STERILE WATER FOR INJECTION IJ SOLN
INTRAMUSCULAR | Status: AC
  Administered 2017-05-14: 10 mL
  Filled 2017-05-14: qty 10

## 2017-05-14 MED ORDER — DIPHENHYDRAMINE HCL 25 MG PO CAPS
25.0000 mg | ORAL_CAPSULE | Freq: Once | ORAL | Status: AC
  Administered 2017-05-14: 25 mg via ORAL
  Filled 2017-05-14: qty 1

## 2017-05-14 MED ORDER — CEFTRIAXONE SODIUM 250 MG IJ SOLR
250.0000 mg | Freq: Once | INTRAMUSCULAR | Status: AC
  Administered 2017-05-14: 250 mg via INTRAMUSCULAR
  Filled 2017-05-14: qty 250

## 2017-05-14 MED ORDER — AZITHROMYCIN 250 MG PO TABS
1000.0000 mg | ORAL_TABLET | Freq: Once | ORAL | Status: AC
  Administered 2017-05-14: 1000 mg via ORAL
  Filled 2017-05-14: qty 4

## 2017-05-14 NOTE — ED Notes (Addendum)
Patient reports no itching now.  No rashes noted.  Patient reports no pain at injection site.  Reports 3/10 penis pain and 6/10 abdominal pain. Notified Dr. Preston FleetingGlick of above.  Ok to discharge patient per Dr. Preston FleetingGlick.

## 2017-05-14 NOTE — ED Notes (Signed)
Pt. Called for results.  STD results are not resulted.  Informed pt. To call back on Monday.

## 2017-05-14 NOTE — ED Provider Notes (Signed)
[name] EMERGENCY DEPARTMENT Provider Note   CSN: [id number] Arrival date & time: 05/14/17  0141     History   Chief Complaint Chief Complaint  Patient presents with  . Penile Discharge  . Dysuria    HPI Kenneth Lane is a 31 y.o. male.  The history is provided by the patient. No language interpreter was used.  Penile Discharge  This is a new problem. Episode onset: today. The problem occurs constantly. The problem has not changed since onset.Pertinent negatives include no abdominal pain. Exacerbated by: voiding. Nothing relieves the symptoms.  Dysuria   This is a new problem. The problem occurs every urination. The problem has been gradually worsening. The quality of the pain is described as burning. The pain is mild. There has been no fever. He is sexually active. There is no history of pyelonephritis. Pertinent negatives include no vomiting, no hematuria and no flank pain. He has tried nothing for the symptoms.    Past Medical History:  Diagnosis Date  . Anxiety   . Asthma   . Bradycardia   . Seasonal allergies     There are no active problems to display for this patient.   Past Surgical History:  Procedure Laterality Date  . DENTAL SURGERY        Home Medications    Prior to Admission medications   Medication Sig Start Date End Date Taking? Authorizing Provider  albuterol (PROVENTIL HFA;VENTOLIN HFA) 108 (90 Base) MCG/ACT inhaler Inhale 1-2 puffs into the lungs every 6 (six) hours as needed for wheezing or shortness of breath.    [provider]  LORazepam (ATIVAN) 1 MG tablet Take 1 tablet (1 mg total) by mouth 3 (three) times daily as needed for anxiety. 01/22/17   Dione Booze, MD    Family History Family History  Problem Relation Age of Onset  . Cancer Other   . Hypertension Other     Social History Social History  Substance Use Topics  . Smoking status: Current Every Day Smoker    Packs/day: 1.00    Types:  Cigarettes  . Smokeless tobacco: Never Used  . Alcohol use Yes     Comment: stopped x 3 weeks     Allergies   Shellfish-derived products   Review of Systems Review of Systems  Gastrointestinal: Negative for abdominal pain and vomiting.  Genitourinary: Positive for discharge and dysuria. Negative for flank pain and hematuria.  Ten systems reviewed and are negative for acute change, except as noted in the HPI.    Physical Exam Updated Vital Signs BP 133/86 (BP Location: Right Arm)   Pulse 100   Temp (!) 97.3 F (36.3 C) (Oral)   Resp 18   Ht 5\' 6"  (1.676 m)   Wt 60.3 kg (133 lb)   SpO2 100%   BMI 21.47 kg/m   Physical Exam  Constitutional: He is oriented to person, place, and time. He appears well-developed and well-nourished. No distress.  Nontoxic and in NAD  HENT:  Head: Normocephalic and atraumatic.  Eyes: Conjunctivae and EOM are normal. No scleral icterus.  Neck: Normal range of motion.  Pulmonary/Chest: Effort normal. No respiratory distress.  Respirations even and unlabored  Genitourinary: Testes normal. Right testis shows no mass, no swelling and no tenderness. Left testis shows no mass, no swelling and no tenderness. Circumcised.  Genitourinary Comments: Exam chaperoned by Coralee North, nurse tech.  Musculoskeletal: Normal range of motion.  Lymphadenopathy: No inguinal adenopathy noted on the right or  left side.  Neurological: He is alert and oriented to person, place, and time. He exhibits normal muscle tone. Coordination normal.  Skin: Skin is warm and dry. No rash noted. He is not diaphoretic. No erythema. No pallor.  Psychiatric: He has a normal mood and affect. His behavior is normal.  Nursing note and vitals reviewed.    ED Treatments / Results  Labs (all labs ordered are listed, but only abnormal results are displayed) Labs Reviewed  URINALYSIS, ROUTINE W REFLEX MICROSCOPIC - Abnormal; Notable for the following:       Result Value   Leukocytes, UA TRACE  (*)    All other components within normal limits  URINE CULTURE  GC/CHLAMYDIA PROBE AMP [facility]    EKG  EKG Interpretation None       Radiology No results found.  Procedures Procedures (including critical care time)  Medications Ordered in ED Medications  cefTRIAXone (ROCEPHIN) injection 250 mg (not administered)  azithromycin (ZITHROMAX) tablet 1,000 mg (not administered)     Initial Impression / Assessment and Plan / ED Course  I have reviewed the triage vital signs and the nursing notes.  Pertinent labs & imaging results that were available during my care of the patient were reviewed by me and considered in my medical decision making (see chart for details).     Patient presenting for dysuria and penile discharge x 1 day; hx of unprotected sex with 2 partners recently. Plan for discharge with instructions to follow up with the Health Department. Discussed importance of using protection when sexually active. Pt understands that they have GC/Chlamydia cultures pending and that they will need to inform all sexual partners if results return positive. Pt has been treated prophylacticly with azithromycin and rocephin due to pts history and UA with increased WBCs. Return precautions discussed and provided. Patient discharged in stable condition with no unaddressed concerns.   Final Clinical Impressions(s) / ED Diagnoses   Final diagnoses:  Pyuria  History of unprotected sex    New Prescriptions New Prescriptions   No medications on file     Antony MaduraHumes, Cherron Blitzer, Cordelia Poche-C 05/14/17 0526    Dione BoozeGlick, David, MD 05/14/17 [phone]

## 2017-05-14 NOTE — ED Notes (Signed)
Patient c/o 9/10 stomach pain and describes pain and sharp and moving.  [place] and [place] In[place]dy Grahams given.

## 2017-05-14 NOTE — Discharge Instructions (Signed)
Your workup today was concerning for a sexually transmitted infection. You have been given antibiotics today which will treat both gonorrhea and chlamydia. Follow-up with the health department in 48 hours regarding the results of your STD tests. Do not engage in sexual intercourse for one week. Notify all sexual partners of their need to be tested and treated for STDs as well. You may return to the emergency department, as needed, for new or concerning symptoms.

## 2017-05-14 NOTE — ED Notes (Signed)
Returned pt.s call, message left for a return call.

## 2017-05-14 NOTE — ED Triage Notes (Signed)
Pt presents with penile discharge and urinary frequency with burning for 24 hrs; pt denies abd pain, n/v; denies lesions, bumps, or swelling

## 2017-05-14 NOTE — ED Notes (Signed)
Patient now states he has and itch and noted to be scratching.  When asked if itch was in one place or all over, patient replied a little all over.  No rashes noted.  Notified Dr. Preston FleetingGlick of above.  Dr. Preston FleetingGlick to order benadryl and then patient to wait 20 minutes.

## 2017-05-14 NOTE — ED Notes (Signed)
No reaction noted from injection (no rash, no difficulty breathing, no itching).

## 2017-05-14 NOTE — ED Notes (Signed)
Requisition for urine culture sent to lab

## 2017-05-15 ENCOUNTER — Emergency Department (HOSPITAL_COMMUNITY): Payer: Medicare Other

## 2017-05-15 ENCOUNTER — Emergency Department (HOSPITAL_COMMUNITY)
Admission: EM | Admit: 2017-05-15 | Discharge: 2017-05-15 | Disposition: A | Discharge status: Home / Self Care | Payer: Medicare Other | Attending: Emergency Medicine | Admitting: Emergency Medicine

## 2017-05-15 DIAGNOSIS — F17210 Nicotine dependence, cigarettes, uncomplicated: Secondary | ICD-10-CM | POA: Diagnosis not present

## 2017-05-15 DIAGNOSIS — Z79899 Other long term (current) drug therapy: Secondary | ICD-10-CM | POA: Diagnosis not present

## 2017-05-15 DIAGNOSIS — R079 Chest pain, unspecified: Secondary | ICD-10-CM | POA: Insufficient documentation

## 2017-05-15 DIAGNOSIS — R0789 Other chest pain: Secondary | ICD-10-CM | POA: Diagnosis not present

## 2017-05-15 DIAGNOSIS — R109 Unspecified abdominal pain: Secondary | ICD-10-CM | POA: Insufficient documentation

## 2017-05-15 DIAGNOSIS — R300 Dysuria: Secondary | ICD-10-CM | POA: Insufficient documentation

## 2017-05-15 DIAGNOSIS — R369 Urethral discharge, unspecified: Secondary | ICD-10-CM | POA: Diagnosis present

## 2017-05-15 DIAGNOSIS — J45909 Unspecified asthma, uncomplicated: Secondary | ICD-10-CM | POA: Diagnosis not present

## 2017-05-15 DIAGNOSIS — R197 Diarrhea, unspecified: Secondary | ICD-10-CM | POA: Diagnosis not present

## 2017-05-15 DIAGNOSIS — R309 Painful micturition, unspecified: Secondary | ICD-10-CM | POA: Diagnosis not present

## 2017-05-15 LAB — URINE CULTURE: Culture: NO GROWTH

## 2017-05-15 LAB — BASIC METABOLIC PANEL
ANION GAP: 9 (ref 5–15)
BUN: 17 mg/dL (ref 6–20)
CHLORIDE: 102 mmol/L (ref 101–111)
CO2: 23 mmol/L (ref 22–32)
CREATININE: 1.09 mg/dL (ref 0.61–1.24)
Calcium: 9.1 mg/dL (ref 8.9–10.3)
GFR calc Af Amer: >60 mL/min (ref >60)
GFR calc non Af Amer: >60 mL/min (ref >60)
Glucose, Bld: 100 mg/dL — ABNORMAL HIGH (ref 65–99)
POTASSIUM: 3.8 mmol/L (ref 3.5–5.1)
Sodium: 134 mmol/L — ABNORMAL LOW (ref 135–145)

## 2017-05-15 LAB — CBC
HEMATOCRIT: 48.4 % (ref 39.0–52.0)
HEMOGLOBIN: 16.6 g/dL (ref 13.0–17.0)
MCH: 29.3 pg (ref 26.0–34.0)
MCHC: 34.3 g/dL (ref 30.0–36.0)
MCV: 85.4 fL (ref 78.0–100.0)
PLATELETS: 290 10*3/uL (ref 150–400)
RBC: 5.67 MIL/uL (ref 4.22–5.81)
RDW: 14.2 % (ref 11.5–15.5)
WBC: 6.9 10*3/uL (ref 4.0–10.5)

## 2017-05-15 LAB — I-STAT TROPONIN, ED: Troponin i, poc: 0 ng/mL (ref 0.00–0.08)

## 2017-05-15 NOTE — ED Provider Notes (Signed)
[name] EMERGENCY DEPARTMENT Provider Note   CSN: [id number] Arrival date & time: 05/15/17  1718     History   Chief Complaint Chief Complaint  Patient presents with  . Diarrhea  . multiple complaints  . Chest Pain    HPI Kenneth Lane is a 31 y.o. male with a past medical history of anxiety, who presents to ED for evaluation of multiple complaints. His first complaint is ongoing penile discharge and dysuria. He was seen here 2 days ago for similar symptoms and was given empirical treatment for gonorrhea and chlamydia. He declined HIV and syphilis testing at that time. His gonorrhea and chlamydia results are still pending. He is concerned because his symptoms have gotten worse.  He also reports several episodes of diarrhea today. He reports intermittent abdominal pain as well.His next complaint is chest pain. He states that he began having left-sided chest pain radiating to bilateral arms and numbness in his legs which she states is resolved. He states this feels similar to his previous panic attacks. He states that he is currently pain-free. He denies any shortness of breath, hemoptysis, leg swelling, prior MI, DVT, PE, recent surgeries, blood thinner use, hematemesis, hematochezia.   HPI  Past Medical History:  Diagnosis Date  . Anxiety   . Asthma   . Bradycardia   . Seasonal allergies     There are no active problems to display for this patient.   Past Surgical History:  Procedure Laterality Date  . DENTAL SURGERY         Home Medications    Prior to Admission medications   Medication Sig Start Date End Date Taking? Authorizing Provider  albuterol (PROVENTIL HFA;VENTOLIN HFA) 108 (90 Base) MCG/ACT inhaler Inhale 1-2 puffs into the lungs every 6 (six) hours as needed for wheezing or shortness of breath.    [provider]  LORazepam (ATIVAN) 1 MG tablet Take 1 tablet (1 mg total) by mouth 3 (three) times daily as needed for  anxiety. 01/22/17   Dione Booze, MD    Family History Family History  Problem Relation Age of Onset  . Cancer Other   . Hypertension Other     Social History Social History  Substance Use Topics  . Smoking status: Current Every Day Smoker    Packs/day: 1.00    Types: Cigarettes  . Smokeless tobacco: Never Used  . Alcohol use Yes     Comment: stopped x 3 weeks     Allergies   Shellfish-derived products   Review of Systems Review of Systems  Constitutional: Negative for appetite change, chills and fever.  HENT: Negative for ear pain, rhinorrhea, sneezing and sore throat.   Eyes: Negative for photophobia and visual disturbance.  Respiratory: Positive for cough. Negative for chest tightness, shortness of breath and wheezing.   Cardiovascular: Positive for chest pain. Negative for palpitations.  Gastrointestinal: Positive for abdominal pain, diarrhea and nausea. Negative for blood in stool, constipation and vomiting.  Genitourinary: Positive for discharge and dysuria. Negative for hematuria and urgency.  Musculoskeletal: Negative for myalgias.  Skin: Negative for rash.  Neurological: Negative for dizziness, weakness and light-headedness.     Physical Exam Updated Vital Signs BP 138/81 (BP Location: Left Arm)   Pulse 64   Temp 98.4 F (36.9 C) (Oral)   Resp 16   SpO2 99%   Physical Exam  Constitutional: He appears well-developed and well-nourished. No distress.  Nontoxic appearing and in no acute distress. Patient does appear  anxious and worried.  HENT:  Head: Normocephalic and atraumatic.  Nose: Nose normal.  Eyes: Conjunctivae and EOM are normal. Right eye exhibits no discharge. Left eye exhibits no discharge. No scleral icterus.  Neck: Normal range of motion. Neck supple.  Cardiovascular: Normal rate, regular rhythm, normal heart sounds and intact distal pulses.  Exam reveals no gallop and no friction rub.   No murmur heard. Pulmonary/Chest: Effort normal and  breath sounds normal. No respiratory distress.  Abdominal: Soft. Bowel sounds are normal. He exhibits no distension. There is no tenderness. There is no guarding.  No tenderness to palpation of the abdomen.  Musculoskeletal: Normal range of motion. He exhibits no edema.  Neurological: He is alert. He exhibits normal muscle tone. Coordination normal.  Skin: Skin is warm and dry. No rash noted.  Psychiatric: He has a normal mood and affect.  Nursing note and vitals reviewed.    ED Treatments / Results  Labs (all labs ordered are listed, but only abnormal results are displayed) Labs Reviewed  BASIC METABOLIC PANEL - Abnormal; Notable for the following:       Result Value   Sodium 134 (*)    Glucose, Bld 100 (*)    All other components within normal limits  CBC  HIV ANTIBODY (ROUTINE TESTING)  RPR  I-STAT TROPONIN, ED    EKG  EKG Interpretation None       Radiology Dg Chest 2 View  Result Date: 05/15/2017 CLINICAL DATA:  Painful urination. EXAM: CHEST  2 VIEW COMPARISON:  January 22, 2017 FINDINGS: No pneumothorax. The heart, hila, and mediastinum are normal. No new nodules. No masses or focal infiltrates. No acute abnormalities identified. IMPRESSION: No acute abnormalities. Electronically Signed   By: Gerome Sam III M.D   On: 05/15/2017 19:22    Procedures Procedures (including critical care time)  Medications Ordered in ED Medications - No data to display   Initial Impression / Assessment and Plan / ED Course  I have reviewed the triage vital signs and the nursing notes.  Pertinent labs & imaging results that were available during my care of the patient were reviewed by me and considered in my medical decision making (see chart for details).     Patient presents to ED for evaluation of multiple complaints. He complains of penile discharge that has not improved since being seen and empirically treated for gonorrhea and chlamydia 2 days ago as well as several  episodes of diarrhea today and chest pain that began just prior to arrival. Regarding his penile discharge, he is concerned because the symptoms have gotten worse despite his impaired treatment. His results for gonorrhea and Chlamydia still pending. Urinalysis was sent for culture 2 days ago and showed no growth. He is afebrile with no history of fever. He has no abdominal tenderness to palpation. He does report several episodes of diarrhea but he does not appear dehydrated. He denies any hematemesis or hematochezia. He states that his chest pain radiating to his arms and legs is completely resolved. He attributes this to his panic attacks which she has had in the past. He denies any shortness of breath, hemoptysis, prior MI, DVT, PE. I educated patient on the fact that sometimes it takes for a few days for his penile discharge resolved. He is concerned because he has not gotten the results of his gonorrhea and chlamydia testing back. Told him that sometimes this takes a few days to resolve as well. I did suggest that he out on  HIV and RPR testing for completion of his workup. He agrees to this. He declines GU exam at this time but he denies any rashes or abnormal lesions in the genital area. I suspect that his diarrhea is due to his antibiotic use. Laboratory including CBC, CMP unremarkable. Troponin negative 1. Chest x-ray unremarkable. EKG similar to previous tracings. I suspect that his chest pain is due to anxiety. I have low suspicion for cardiac or pulmonary cause of his chest pain is on the fact the patient is symptom free. Patient is requesting discharge at this time and states that he needs to pick up his child from the doctor's office. I encouraged him to follow up with the results of the remaining lab work when available. Patient appears stable for discharge at this time. Strict return precautions given.  Final Clinical Impressions(s) / ED Diagnoses   Final diagnoses:  Chest wall pain  Diarrhea,  unspecified type    New Prescriptions New Prescriptions   No medications on file     Aboubacar Matsuo, PA-C 10Dietrich Pates/20/18 2056   Cordelia Poche Nira Connardama, Pedro Eduardo, MD 05/16/17 [phone]

## 2017-05-15 NOTE — Discharge Instructions (Signed)
Please read attached information regarding your condition. Follow-up with results of remaining lab work 1 available. Please ensure adequate fluid and food intake. Return to ED for worsening abdominal pain, increased vomiting, lightheadedness, loss of consciousness, severe chest pain, trouble breathing.

## 2017-05-15 NOTE — ED Triage Notes (Addendum)
Pt was seen here and treated for STDS 10/19. Pt was given antibiotics at ED but no prescriptions. Pt was seen for painful urination and penile discharge. Pt is still having those symptoms. Pt took antibiotics yesterday and now states he also has diarrhea that started today.  Pt also states bilateral leg weakness today and now states left sided chest pain, radiating to both arms. -note pt did not check in for chest pain but now states he has this that started today.

## 2017-05-16 LAB — RPR: RPR Ser Ql: NONREACTIVE

## 2017-05-16 LAB — HIV ANTIBODY (ROUTINE TESTING W REFLEX): HIV Screen 4th Generation wRfx: NONREACTIVE

## 2017-05-17 NOTE — ED Notes (Signed)
Pt. Called and requested his results for his STD .  HIV/RPR results given.  Chlamydia and Gonorrhea results are not completed.

## 2017-10-10 ENCOUNTER — Emergency Department (HOSPITAL_COMMUNITY): Payer: Medicare Other

## 2017-10-10 ENCOUNTER — Encounter (HOSPITAL_COMMUNITY): Payer: Self-pay

## 2017-10-10 ENCOUNTER — Emergency Department (HOSPITAL_COMMUNITY)
Admission: EM | Admit: 2017-10-10 | Discharge: 2017-10-10 | Disposition: A | Discharge status: Home / Self Care | Payer: Medicare Other | Attending: Emergency Medicine | Admitting: Emergency Medicine

## 2017-10-10 DIAGNOSIS — M25571 Pain in right ankle and joints of right foot: Secondary | ICD-10-CM

## 2017-10-10 DIAGNOSIS — B9789 Other viral agents as the cause of diseases classified elsewhere: Secondary | ICD-10-CM | POA: Diagnosis not present

## 2017-10-10 DIAGNOSIS — J069 Acute upper respiratory infection, unspecified: Secondary | ICD-10-CM | POA: Diagnosis not present

## 2017-10-10 DIAGNOSIS — J4521 Mild intermittent asthma with (acute) exacerbation: Secondary | ICD-10-CM | POA: Diagnosis not present

## 2017-10-10 DIAGNOSIS — R05 Cough: Secondary | ICD-10-CM | POA: Diagnosis present

## 2017-10-10 DIAGNOSIS — J45901 Unspecified asthma with (acute) exacerbation: Secondary | ICD-10-CM | POA: Diagnosis not present

## 2017-10-10 DIAGNOSIS — R0789 Other chest pain: Secondary | ICD-10-CM | POA: Diagnosis not present

## 2017-10-10 LAB — BASIC METABOLIC PANEL
Anion gap: 11 (ref 5–15)
BUN: 8 mg/dL (ref 6–20)
CO2: 22 mmol/L (ref 22–32)
Calcium: 9.6 mg/dL (ref 8.9–10.3)
Chloride: 105 mmol/L (ref 101–111)
Creatinine, Ser: 1.08 mg/dL (ref 0.61–1.24)
GFR calc Af Amer: >60 mL/min (ref >60)
GFR calc non Af Amer: >60 mL/min (ref >60)
Glucose, Bld: 97 mg/dL (ref 65–99)
Potassium: 3.7 mmol/L (ref 3.5–5.1)
Sodium: 138 mmol/L (ref 135–145)

## 2017-10-10 LAB — CBC
HCT: 43.7 % (ref 39.0–52.0)
Hemoglobin: 15.4 g/dL (ref 13.0–17.0)
MCH: 30 pg (ref 26.0–34.0)
MCHC: 35.2 g/dL (ref 30.0–36.0)
MCV: 85 fL (ref 78.0–100.0)
Platelets: 204 10*3/uL (ref 150–400)
RBC: 5.14 MIL/uL (ref 4.22–5.81)
RDW: 13.4 % (ref 11.5–15.5)
WBC: 6.1 10*3/uL (ref 4.0–10.5)

## 2017-10-10 LAB — I-STAT TROPONIN, ED: Troponin i, poc: 0 ng/mL (ref 0.00–0.08)

## 2017-10-10 MED ORDER — PREDNISONE 20 MG PO TABS
60.0000 mg | ORAL_TABLET | Freq: Once | ORAL | Status: AC
  Administered 2017-10-10: 60 mg via ORAL
  Filled 2017-10-10: qty 3

## 2017-10-10 MED ORDER — GUAIFENESIN-CODEINE 100-10 MG/5ML PO SOLN
5.0000 mL | Freq: Once | ORAL | Status: AC
  Administered 2017-10-10: 5 mL via ORAL
  Filled 2017-10-10: qty 5

## 2017-10-10 MED ORDER — ALBUTEROL SULFATE HFA 108 (90 BASE) MCG/ACT IN AERS
2.0000 | INHALATION_SPRAY | Freq: Once | RESPIRATORY_TRACT | Status: AC
Start: 2017-10-10 — End: 2017-10-10
  Administered 2017-10-10: 2 via RESPIRATORY_TRACT
  Filled 2017-10-10: qty 6.7

## 2017-10-10 MED ORDER — PREDNISONE 50 MG PO TABS: 50.0000 mg | ORAL_TABLET | Freq: Every day | ORAL | 0 refills | Status: AC

## 2017-10-10 MED ORDER — ALBUTEROL SULFATE (2.5 MG/3ML) 0.083% IN NEBU
5.0000 mg | INHALATION_SOLUTION | Freq: Once | RESPIRATORY_TRACT | Status: AC
  Administered 2017-10-10: 5 mg via RESPIRATORY_TRACT
  Filled 2017-10-10: qty 6

## 2017-10-10 MED ORDER — ALBUTEROL SULFATE HFA 108 (90 BASE) MCG/ACT IN AERS: 1.0000 | INHALATION_SPRAY | Freq: Four times a day (QID) | RESPIRATORY_TRACT | 0 refills | Status: DC | PRN

## 2017-10-10 MED ORDER — ONDANSETRON 4 MG PO TBDP
4.0000 mg | ORAL_TABLET | Freq: Once | ORAL | Status: AC
  Administered 2017-10-10: 4 mg via ORAL
  Filled 2017-10-10: qty 1

## 2017-10-10 MED ORDER — DM-GUAIFENESIN ER 30-600 MG PO TB12: 1.0000 | ORAL_TABLET | Freq: Two times a day (BID) | ORAL | 0 refills | Status: DC

## 2017-10-10 NOTE — ED Notes (Signed)
Pt verbalized understanding of d/c instructions and has no further questions, VSS, NAD. Pt given albuterol inhaler to go home with him and understands proper use of it.

## 2017-10-10 NOTE — ED Notes (Signed)
ED Provider at bedside. 

## 2017-10-10 NOTE — ED Triage Notes (Signed)
Pt reports chest tightness with cough not relieved by inhaler. Pt reports having cough x 2 weeks with post tussive emesis. Pt also endorses right ankle pain with no known injury

## 2017-10-10 NOTE — ED Provider Notes (Signed)
[name] EMERGENCY DEPARTMENT Provider Note   CSN: [id number] Arrival date & time: 10/10/17  7829     History   Chief Complaint Chief Complaint  Patient presents with  . Cough  . Emesis    HPI Kenneth Lane is a 32 y.o. male.  Kenneth Lane is a 32 y.o. Male with history of asthma, seasonal allergies and anxiety, who presents to the ED for evaluation of 2 days of cough, chest tightness and wheezing.  Patient reports the symptoms have not been relieved with his albuterol inhaler.  Patient reports strong coughing spells with several episodes of posttussive emesis, reports some mild epigastric discomfort, but denies any other abdominal pain, no diarrhea, fevers or chills, no urinary symptoms.  Patient denies chest pain aside from soreness from coughing.  He reports some rhinorrhea and nasal congestion and scratchy throat.  No ear pain.  No generalized body aches or fevers.  Son was sick with upper respiratory infection a few days ago, he thinks this likely triggered an asthma exacerbation.  Patient also reports small painful nodule on the medial aspect of the right ankle, he noticed it this morning when he woke up it is more painful with palpation and when he is sleeping, no associated swelling, redness or warmth.  No decreased range of motion of the ankle or foot.  No difficulty walking, or pain with ambulation.  Has not tried anything other than his inhaler for all of the symptoms today, denies any other aggravating or alleviating factors.      Past Medical History:  Diagnosis Date  . Anxiety   . Asthma   . Bradycardia   . Seasonal allergies     There are no active problems to display for this patient.   Past Surgical History:  Procedure Laterality Date  . DENTAL SURGERY         Home Medications    Prior to Admission medications   Medication Sig Start Date End Date Taking? Authorizing Provider  albuterol (PROVENTIL HFA;VENTOLIN HFA) 108 (90  Base) MCG/ACT inhaler Inhale 1-2 puffs into the lungs every 6 (six) hours as needed for wheezing or shortness of breath.    [provider]  LORazepam (ATIVAN) 1 MG tablet Take 1 tablet (1 mg total) by mouth 3 (three) times daily as needed for anxiety. 01/22/17   Dione Booze, MD  omeprazole (PRILOSEC) 20 MG capsule Take 20 mg by mouth daily as needed. For upset stomach   10/20/11  [provider]  sucralfate (CARAFATE) 1 G tablet Take 1 tablet (1 g total) by mouth 4 (four) times daily. 06/08/11 10/20/11  Eber Hong, MD    Family History Family History  Problem Relation Age of Onset  . Cancer Other   . Hypertension Other     Social History Social History   Tobacco Use  . Smoking status: Current Every Day Smoker    Packs/day: 1.00    Types: Cigarettes  . Smokeless tobacco: Never Used  Substance Use Topics  . Alcohol use: Yes    Comment: stopped x 3 weeks  . Drug use: Yes    Types: Marijuana     Allergies   Shellfish-derived products   Review of Systems Review of Systems  Constitutional: Negative for chills and fever.  HENT: Positive for congestion, postnasal drip, rhinorrhea, sinus pressure and sore throat. Negative for ear discharge, ear pain and trouble swallowing.   Eyes: Negative for discharge, redness and itching.  Respiratory: Positive for  cough, chest tightness and wheezing. Negative for shortness of breath and stridor.   Cardiovascular: Negative for chest pain and palpitations.  Gastrointestinal: Positive for vomiting. Negative for abdominal pain, blood in stool, diarrhea and nausea.  Genitourinary: Negative for dysuria.  Musculoskeletal: Negative for arthralgias and myalgias.  Skin: Negative for color change, pallor and rash.  Neurological: Negative for dizziness, light-headedness and headaches.     Physical Exam Updated Vital Signs BP 120/77   Pulse (!) 59   Temp 98.5 F (36.9 C) (Oral)   Resp 20   SpO2 100%   Physical Exam    Constitutional: He appears well-developed and well-nourished. No distress.  HENT:  Head: Normocephalic and atraumatic.  TMs clear with good landmarks, moderate nasal mucosa edema with clear rhinorrhea, posterior oropharynx clear and moist, with some erythema, no edema or exudates, uvula midline  Eyes: Right eye exhibits no discharge. Left eye exhibits no discharge.  Neck: Neck supple.  Cardiovascular: Normal rate, regular rhythm, normal heart sounds and intact distal pulses.  Pulses:      Radial pulses are 2+ on the right side, and 2+ on the left side.       Dorsalis pedis pulses are 2+ on the right side, and 2+ on the left side.  Pulmonary/Chest: Effort normal and breath sounds normal. No stridor. No respiratory distress. He has no wheezes. He has no rales.  Respirations equal and unlabored, patient able to speak in full sentences, lungs clear to auscultation bilaterally, with slightly decreased air movement throughout, no wheezes, rhonchi or rales  Abdominal: Soft. Bowel sounds are normal. He exhibits no distension and no mass. There is no tenderness. There is no guarding.  Musculoskeletal: He exhibits no edema or deformity.       Feet:  Lymphadenopathy:    He has no cervical adenopathy.  Neurological: He is alert. Coordination normal.  Skin: Skin is warm and dry. Capillary refill takes less than 2 seconds. He is not diaphoretic.  Psychiatric: He has a normal mood and affect. His behavior is normal.  Nursing note and vitals reviewed.    ED Treatments / Results  Labs (all labs ordered are listed, but only abnormal results are displayed) Labs Reviewed  BASIC METABOLIC PANEL  CBC  I-STAT TROPONIN, ED    EKG  EKG Interpretation  Date/Time:  Sunday October 10 2017 09:30:37 EDT Ventricular Rate:  77 PR Interval:  128 QRS Duration: 94 QT Interval:  352 QTC Calculation: 398 R Axis:   65 Text Interpretation:  Normal sinus rhythm Cannot rule out Inferior infarct , age undetermined  Abnormal ECG No significant change since last tracing Confirmed by Raeford Razor [phone]) on 10/10/2017 1:58:53 PM       Radiology Dg Chest 2 View  Result Date: 10/10/2017 CLINICAL DATA:  Patient with chest tightness EXAM: CHEST - 2 VIEW COMPARISON:  Chest radiograph 05/15/2017. FINDINGS: Normal cardiac and mediastinal contours. No consolidative pulmonary opacities. No pleural effusion or pneumothorax. Thoracic spine degenerative changes. IMPRESSION: No acute cardiopulmonary process. Electronically Signed   By: Annia Belt M.D.   On: 10/10/2017 10:21   Dg Ankle Complete Right  Result Date: 10/10/2017 CLINICAL DATA:  Patient with pain to the medial malleolus. EXAM: RIGHT ANKLE - COMPLETE 3+ VIEW COMPARISON:  Ankle radiograph 11/02/2011. FINDINGS: Normal anatomic alignment. No evidence for acute fracture or dislocation. Soft tissues unremarkable. IMPRESSION: No acute osseous abnormality. Electronically Signed   By: Annia Belt M.D.   On: 10/10/2017 10:22    Procedures Procedures (  including critical care time)  Medications Ordered in ED Medications  albuterol (PROVENTIL HFA;VENTOLIN HFA) 108 (90 Base) MCG/ACT inhaler 2 puff (not administered)  albuterol (PROVENTIL) (2.5 MG/3ML) 0.083% nebulizer solution 5 mg (5 mg Nebulization Given 10/10/17 1246)  predniSONE (DELTASONE) tablet 60 mg (60 mg Oral Given 10/10/17 1244)  ondansetron (ZOFRAN-ODT) disintegrating tablet 4 mg (4 mg Oral Given 10/10/17 1243)  guaiFENesin-codeine 100-10 MG/5ML solution 5 mL (5 mLs Oral Given 10/10/17 1244)     Initial Impression / Assessment and Plan / ED Course  I have reviewed the triage vital signs and the nursing notes.  Pertinent labs & imaging results that were available during my care of the patient were reviewed by me and considered in my medical decision making (see chart for details).  Patient presents to the ED for evaluation of cough and chest tightness not relieved by inhaler, associated rhinorrhea nasal  congestion, no fevers chills or body aches.  Few episodes of posttussive emesis but abdomen is benign.  On exam patient with normal vitals, no increased work of breathing, tachypnea or hypoxia, lungs clear to auscultation with slightly decreased air movement throughout tight sounding on expiration.  Likely asthma exacerbation in the setting of viral URI, symptoms do not sound in line with influenza and patient has no known flu contacts.  Chest x-ray shows no evidence of acute cardiopulmonary disease.  I have reviewed patient's labs and they are overall reassuring, no leukocytosis, normal hemoglobin, no electrolyte derangements, negative troponin and EKG without any ischemic changes.  Will treat with cough medicine, albuterol inhaler and prednisone.  Patient also reports he noticed a small nodule in the medial aspect of his right ankle that is tender to palpation, no pain when he is leaving alone.  On exam small nodule less than 1 cm palpated about 4 cm above the malleolus, no associated swelling, redness, warmth or induration, lower extremity is neurovascularly intact, x-ray shows no bone abnormalities.  Do not suspect any emergent etiology, no concern for DVT, will have patient follow-up with primary care for continued evaluation of this encourage patient to leave it alone and use Tylenol and ibuprofen for pain.  On reevaluation after inhaler, lungs are much more open, patient reports he is feeling better.  At this time he stable for discharge provided with albuterol inhaler here in the ED as well as prescription for refill, short course of steroids and guaifenesin cough syrup.  Patient to follow-up with [facility] or establish primary care using the phone number provided on his paperwork.  Strict return precautions discussed.  Patient expresses understanding and is in agreement with plan.  Final Clinical Impressions(s) / ED Diagnoses   Final diagnoses:  Viral URI with cough  Mild  intermittent asthma with exacerbation  Acute right ankle pain    ED Discharge Orders        Ordered    albuterol (PROVENTIL HFA;VENTOLIN HFA) 108 (90 Base) MCG/ACT inhaler  Every 6 hours PRN     10/10/17 1343    predniSONE (DELTASONE) 50 MG tablet  Daily     10/10/17 1343    dextromethorphan-guaiFENesin (MUCINEX DM) 30-600 MG 12hr tablet  2 times daily     10/10/17 1343       Dartha LodgeFord, Kelsey N, [place]PA-C 10/10/17 1407    Raeford RazorKohut, Stephen, MD 10/10/17 1717

## 2017-10-10 NOTE — Discharge Instructions (Signed)
Your symptoms are likely caused by a viral upper respiratory infection which exacerbated your asthma. Antibiotics are not helpful in treating viral infection, the virus should run its course in about 5--7 days. Please make sure you are drinking plenty of fluids. Please use your inhaler every 4 hours for the next 24 hrs and then as needed, and take steroids for the next 4 days. You can treat your symptoms supportively with tylenol/ibuprofen for fevers and pains, Zyrtec and Flonase to heal with nasal congestion, and over the counter cough syrups and throat lozenges to help with cough. If your symptoms are not improving please follow up with you Primary doctor.   Your ankle x-ray shows no abnormality, try and leave the area alone and follow up with [facility] regarding this, return to ED if pain becomes worse, you have swelling redness or warmth, numbness or weakness, or other new or concerning symptoms develop.  If you develop persistent fevers, shortness of breath or difficulty breathing, chest pain, severe headache and neck pain, persistent nausea and vomiting or other new or concerning symptoms return to the Emergency department.

## 2017-11-28 ENCOUNTER — Other Ambulatory Visit: Payer: Self-pay

## 2017-11-28 ENCOUNTER — Ambulatory Visit (HOSPITAL_COMMUNITY)
Admission: EM | Admit: 2017-11-28 | Discharge: 2017-11-28 | Disposition: A | Discharge status: Home / Self Care | Payer: Medicare Other | Attending: Family Medicine | Admitting: Family Medicine

## 2017-11-28 ENCOUNTER — Encounter (HOSPITAL_COMMUNITY): Payer: Self-pay | Admitting: Emergency Medicine

## 2017-11-28 DIAGNOSIS — T23191A Burn of first degree of multiple sites of right wrist and hand, initial encounter: Secondary | ICD-10-CM

## 2017-11-28 MED ORDER — KETOROLAC TROMETHAMINE 60 MG/2ML IM SOLN
60.0000 mg | Freq: Once | INTRAMUSCULAR | Status: AC
  Administered 2017-11-28: 60 mg via INTRAMUSCULAR

## 2017-11-28 MED ORDER — KETOROLAC TROMETHAMINE 60 MG/2ML IM SOLN
INTRAMUSCULAR | Status: AC
  Filled 2017-11-28: qty 2

## 2017-11-28 MED ORDER — IBUPROFEN 800 MG PO TABS: 800.0000 mg | ORAL_TABLET | Freq: Three times a day (TID) | ORAL | 0 refills | Status: DC

## 2017-11-28 NOTE — ED Notes (Signed)
Patient is polite, but pacing and begging for pain medicine.  Patient begging to take dressing off right hand and place in cold water.
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
## 2017-11-28 NOTE — Discharge Instructions (Addendum)
Elevate hand Leave dressing in place Come back tomorrow for dressing change Take ibuprofen for pain
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
## 2017-11-29 ENCOUNTER — Ambulatory Visit (HOSPITAL_COMMUNITY)
Admission: EM | Admit: 2017-11-29 | Discharge: 2017-11-29 | Disposition: A | Discharge status: Home / Self Care | Payer: Medicare Other

## 2017-11-29 ENCOUNTER — Ambulatory Visit (HOSPITAL_COMMUNITY)
Admission: EM | Admit: 2017-11-29 | Discharge: 2017-11-29 | Discharge status: Against Medical Advice | Payer: Medicare Other

## 2017-11-29 NOTE — ED Notes (Signed)
Pt did not return. 

## 2017-11-29 NOTE — ED Notes (Signed)
Pt had to leave to go pick up his son.

## 2017-11-29 NOTE — ED Notes (Signed)
Pt was called and he stated he would return in 30 min.

## 2018-01-13 ENCOUNTER — Other Ambulatory Visit: Payer: Self-pay

## 2018-01-13 ENCOUNTER — Emergency Department (HOSPITAL_COMMUNITY)
Admission: EM | Admit: 2018-01-13 | Discharge: 2018-01-13 | Disposition: A | Discharge status: Home / Self Care | Payer: Medicare Other | Attending: Emergency Medicine | Admitting: Emergency Medicine

## 2018-01-13 ENCOUNTER — Encounter (HOSPITAL_COMMUNITY): Payer: Self-pay

## 2018-01-13 DIAGNOSIS — F17210 Nicotine dependence, cigarettes, uncomplicated: Secondary | ICD-10-CM | POA: Insufficient documentation

## 2018-01-13 DIAGNOSIS — N342 Other urethritis: Secondary | ICD-10-CM

## 2018-01-13 DIAGNOSIS — Z79899 Other long term (current) drug therapy: Secondary | ICD-10-CM | POA: Diagnosis not present

## 2018-01-13 DIAGNOSIS — R300 Dysuria: Secondary | ICD-10-CM | POA: Diagnosis not present

## 2018-01-13 DIAGNOSIS — J45909 Unspecified asthma, uncomplicated: Secondary | ICD-10-CM | POA: Diagnosis not present

## 2018-01-13 DIAGNOSIS — R3989 Other symptoms and signs involving the genitourinary system: Secondary | ICD-10-CM | POA: Diagnosis present

## 2018-01-13 LAB — URINALYSIS, ROUTINE W REFLEX MICROSCOPIC
Glucose, UA: NEGATIVE mg/dL
Hgb urine dipstick: NEGATIVE
Ketones, ur: 5 mg/dL — AB
Leukocytes, UA: NEGATIVE
Nitrite: NEGATIVE
PROTEIN: NEGATIVE mg/dL
Specific Gravity, Urine: 1.026 (ref 1.005–1.030)
pH: 6 (ref 5.0–8.0)

## 2018-01-13 MED ORDER — AZITHROMYCIN 250 MG PO TABS
1000.0000 mg | ORAL_TABLET | Freq: Once | ORAL | Status: AC
Start: 2018-01-13 — End: 2018-01-13
  Administered 2018-01-13: 1000 mg via ORAL

## 2018-01-13 MED ORDER — LIDOCAINE HCL (PF) 1 % IJ SOLN
INTRAMUSCULAR | Status: AC
  Filled 2018-01-13: qty 5

## 2018-01-13 MED ORDER — CEFTRIAXONE SODIUM 250 MG IJ SOLR
250.0000 mg | Freq: Once | INTRAMUSCULAR | Status: AC
  Administered 2018-01-13: 250 mg via INTRAMUSCULAR
  Filled 2018-01-13: qty 250

## 2018-01-13 NOTE — ED Provider Notes (Signed)
[name] Provider [facility]   CSN: [id number] Arrival date & time: 01/13/18  2016     History   Chief Complaint Chief Complaint  Patient presents with  . Exposure to STD    HPI Kenneth Lane is a 32 y.o. male.  HPI  Patient is a 32 year old male with a history of anxiety, asthma and seasonal allergies who presents to the emergency department for evaluation of urethral pain.  Patient reports that he recently had unprotected sex with a new male partner and is concerned that he may have been exposed to an STD.  States that he has had a burning sensation in the urethra which started yesterday.  Pain is worse with urination.  He states that he sometimes feels like he has to strain to urinate.  Has mild periumbilical pain as well.  Denies fever, chills, penile discharge, hematuria, urinary frequency, flank pain, nausea/vomiting, diarrhea, painful bowel movements, testicular pain/swelling, genital lesion.  Past Medical History:  Diagnosis Date  . Anxiety   . Asthma   . Bradycardia   . Seasonal allergies     There are no active problems to display for this patient.   Past Surgical History:  Procedure Laterality Date  . DENTAL SURGERY          Home Medications    Prior to Admission medications   Medication Sig Start Date End Date Taking? Authorizing Provider  albuterol (PROVENTIL HFA;VENTOLIN HFA) 108 (90 Base) MCG/ACT inhaler Inhale 1-2 puffs into the lungs every 6 (six) hours as needed for wheezing or shortness of breath.    [provider]  albuterol (PROVENTIL HFA;VENTOLIN HFA) 108 (90 Base) MCG/ACT inhaler Inhale 1-2 puffs into the lungs every 6 (six) hours as needed for wheezing or shortness of breath. 10/10/17   Dartha LodgeFord, Kelsey N, PA-C  ibuprofen (ADVIL,MOTRIN) 800 MG tablet Take 1 tablet (800 mg total) by mouth 3 (three) times daily. 11/28/17   Eustace MooreNelson, Yvonne Sue, MD  LORazepam (ATIVAN) 1 MG tablet Take 1 tablet (1 mg total)  by mouth 3 (three) times daily as needed for anxiety. 01/22/17   Dione BoozeGlick, David, MD  omeprazole (PRILOSEC) 20 MG capsule Take 20 mg by mouth daily as needed. For upset stomach   10/20/11  [provider]  sucralfate (CARAFATE) 1 G tablet Take 1 tablet (1 g total) by mouth 4 (four) times daily. 06/08/11 10/20/11  Eber HongMiller, Brian, MD    Family History Family History  Problem Relation Age of Onset  . Cancer Other   . Hypertension Other     Social History Social History   Tobacco Use  . Smoking status: Current Every Day Smoker    Packs/day: 1.00    Types: Cigarettes  . Smokeless tobacco: Never Used  Substance Use Topics  . Alcohol use: Yes    Comment: stopped x 3 weeks  . Drug use: Yes    Types: Marijuana     Allergies   Shellfish-derived products   Review of Systems Review of Systems  Constitutional: Negative for chills and fever.  Gastrointestinal: Positive for abdominal pain. Negative for blood in stool, diarrhea, nausea and vomiting.  Genitourinary: Positive for dysuria. Negative for discharge, flank pain, frequency, genital sores, hematuria, penile swelling, scrotal swelling and testicular pain.  Musculoskeletal: Negative for back pain and gait problem.  Skin: Negative for rash.     Physical Exam Updated Vital Signs BP 122/72 (BP Location: Right Arm)   Pulse (!) 54   Temp 98  F (36.7 C) (Oral)   Resp 16   SpO2 100%   Physical Exam  Constitutional: He is oriented to person, place, and time. He appears well-developed and well-nourished. No distress.  Nontoxic-appearing.  HENT:  Head: Normocephalic and atraumatic.  Eyes: Right eye exhibits no discharge. Left eye exhibits no discharge.  Pulmonary/Chest: Effort normal. No respiratory distress.  Abdominal: Soft. Bowel sounds are normal. There is no tenderness.  Genitourinary:  Genitourinary Comments: Chaperone present for exam. No discharge from penis. No signs of lesion or erythema on the penis or testicles.  The penis and testicles are nontender. No testicular masses or swelling.  Neurological: He is alert and oriented to person, place, and time. Coordination normal.  Skin: Skin is warm and dry. He is not diaphoretic.  Psychiatric: He has a normal mood and affect. His behavior is normal.  Nursing note and vitals reviewed.    ED Treatments / Results  Labs (all labs ordered are listed, but only abnormal results are displayed) Labs Reviewed  URINALYSIS, ROUTINE W REFLEX MICROSCOPIC - Abnormal; Notable for the following components:      Result Value   Bilirubin Urine SMALL (*)    Ketones, ur 5 (*)    All other components within normal limits  RPR  HIV ANTIBODY (ROUTINE TESTING)  GC/CHLAMYDIA PROBE AMP ([place]) NOT AT [facility]    EKG None  Radiology No results found.  Procedures Procedures (including critical care time)  Medications Ordered in ED Medications  lidocaine (PF) (XYLOCAINE) 1 % injection (has no administration in time range)  cefTRIAXone (ROCEPHIN) injection 250 mg (250 mg Intramuscular Given 01/13/18 2352)  azithromycin (ZITHROMAX) tablet 1,000 mg (1,000 mg Oral Given 01/13/18 2352)     Initial Impression / Assessment and Plan / ED Course  I have reviewed the triage vital signs and the nursing notes.  Pertinent labs & imaging results that were available during my care of the patient were reviewed by me and considered in my medical decision making (see chart for details).     Patient presents with dysuria and recent unprotected sex with new male sexual partner. He is afebrile without abdominal tenderness or painful bowel movements to indicate prostatitis.  No tenderness to palpation of the testes or epididymis to suggest orchitis or epididymitis.  STD cultures obtained including HIV, syphilis, gonorrhea and chlamydia. Discussed importance of using protection when sexually active. Pt understands that he has GC/Chlamydia cultures pending and that he will need to inform  all sexual partners if results return positive. Patient has been treated prophylactically with azithromycin and Rocephin.   Final Clinical Impressions(s) / ED Diagnoses   Final diagnoses:  Urethritis    ED Discharge Orders    None       Lawrence Marseilles 01/14/18 0146    Tilden Fossa, MD 01/14/18 [phone]

## 2018-01-13 NOTE — Discharge Instructions (Addendum)
You were treated for chlamydia and gonorrhea today.  Your testing for chlamydia, gonorrhea, HIV and syphilis will return in 2 to 3 days.  If positive you will receive a phone call and need to inform all sexual partners.  Return to the emergency department if you have any new or concerning symptoms like fever greater than 100.4 F, vomiting, unable to urinate, new or worsening abdominal pain.

## 2018-01-13 NOTE — ED Triage Notes (Signed)
Pt reports that since yesterday he has been having pain in his penis along with dysuria, denies swelling or discharge

## 2018-01-13 NOTE — ED Provider Notes (Signed)
Patient placed in Quick Look pathway, seen and evaluated   Chief Complaint: pain in urethra  HPI:   Onset yesterday no dc. Monogamous withsingle male  ROS: Urethral pain  (one)  Physical Exam:   Gen: No distress  Neuro: Awake and Alert  Skin: Warm    Focused Exam: RRR, Lungs CTAB   Initiation of care has begun. The patient has been counseled on the process, plan, and necessity for staying for the completion/evaluation, and the remainder of the medical screening examination    Delos HaringHarris, Johnross Nabozny, PA-C 01/13/18 2039    Linwood DibblesKnapp, Jon, MD 01/16/18 2015

## 2018-01-14 LAB — RPR: RPR Ser Ql: NONREACTIVE

## 2018-01-14 LAB — HIV ANTIBODY (ROUTINE TESTING W REFLEX): HIV SCREEN 4TH GENERATION: NONREACTIVE

## 2018-01-17 LAB — GC/CHLAMYDIA PROBE AMP (~~LOC~~) NOT AT ARMC
CHLAMYDIA, DNA PROBE: NEGATIVE
Neisseria Gonorrhea: NEGATIVE

## 2018-01-29 ENCOUNTER — Other Ambulatory Visit: Payer: Self-pay

## 2018-01-29 ENCOUNTER — Emergency Department (HOSPITAL_COMMUNITY): Payer: Medicare Other

## 2018-01-29 ENCOUNTER — Emergency Department (HOSPITAL_COMMUNITY)
Admission: EM | Admit: 2018-01-29 | Discharge: 2018-01-29 | Disposition: A | Discharge status: Home / Self Care | Payer: Medicare Other | Attending: Emergency Medicine | Admitting: Emergency Medicine

## 2018-01-29 ENCOUNTER — Encounter (HOSPITAL_COMMUNITY): Payer: Self-pay | Admitting: Emergency Medicine

## 2018-01-29 DIAGNOSIS — F419 Anxiety disorder, unspecified: Secondary | ICD-10-CM | POA: Insufficient documentation

## 2018-01-29 DIAGNOSIS — R079 Chest pain, unspecified: Secondary | ICD-10-CM | POA: Diagnosis not present

## 2018-01-29 DIAGNOSIS — R0602 Shortness of breath: Secondary | ICD-10-CM | POA: Diagnosis not present

## 2018-01-29 DIAGNOSIS — Z5321 Procedure and treatment not carried out due to patient leaving prior to being seen by health care provider: Secondary | ICD-10-CM | POA: Insufficient documentation

## 2018-01-29 LAB — CBC
HCT: 42.2 % (ref 39.0–52.0)
HEMOGLOBIN: 14.1 g/dL (ref 13.0–17.0)
MCH: 28.5 pg (ref 26.0–34.0)
MCHC: 33.4 g/dL (ref 30.0–36.0)
MCV: 85.3 fL (ref 78.0–100.0)
PLATELETS: 248 10*3/uL (ref 150–400)
RBC: 4.95 MIL/uL (ref 4.22–5.81)
RDW: 13.9 % (ref 11.5–15.5)
WBC: 8.2 10*3/uL (ref 4.0–10.5)

## 2018-01-29 LAB — BASIC METABOLIC PANEL
Anion gap: 11 (ref 5–15)
BUN: 16 mg/dL (ref 6–20)
CHLORIDE: 109 mmol/L (ref 98–111)
CO2: 22 mmol/L (ref 22–32)
CREATININE: 0.96 mg/dL (ref 0.61–1.24)
Calcium: 9.4 mg/dL (ref 8.9–10.3)
GFR calc non Af Amer: >60 mL/min (ref >60)
Glucose, Bld: 99 mg/dL (ref 70–99)
Potassium: 3.3 mmol/L — ABNORMAL LOW (ref 3.5–5.1)
Sodium: 142 mmol/L (ref 135–145)

## 2018-01-29 LAB — I-STAT TROPONIN, ED: Troponin i, poc: 0 ng/mL (ref 0.00–0.08)

## 2018-01-29 NOTE — ED Notes (Signed)
No answer in waiting room x3

## 2018-01-29 NOTE — ED Triage Notes (Signed)
PT anxious- states for the past 3-4 days he is having acid reflux that wakes him up and then he starts getting anxious and having tightness in center of chest.  Reports sob and vomiting.

## 2018-01-29 NOTE — ED Notes (Signed)
No answer in waiting room 

## 2018-01-29 NOTE — ED Notes (Signed)
Called in waiting room with no answer 

## 2018-01-31 NOTE — ED Notes (Signed)
Follow up call made  Pt states feeling better  Will follow w his pcp  01/31/18  0920  s Cinque Begley rn

## 2018-02-18 ENCOUNTER — Emergency Department (HOSPITAL_COMMUNITY)
Admission: EM | Admit: 2018-02-18 | Discharge: 2018-02-18 | Disposition: A | Discharge status: Home / Self Care | Payer: Medicare Other | Attending: Emergency Medicine | Admitting: Emergency Medicine

## 2018-02-18 ENCOUNTER — Emergency Department (HOSPITAL_COMMUNITY): Payer: Medicare Other

## 2018-02-18 ENCOUNTER — Encounter (HOSPITAL_COMMUNITY): Payer: Self-pay | Admitting: Emergency Medicine

## 2018-02-18 ENCOUNTER — Other Ambulatory Visit: Payer: Self-pay

## 2018-02-18 DIAGNOSIS — M25562 Pain in left knee: Secondary | ICD-10-CM | POA: Diagnosis not present

## 2018-02-18 DIAGNOSIS — S8992XA Unspecified injury of left lower leg, initial encounter: Secondary | ICD-10-CM | POA: Diagnosis not present

## 2018-02-18 DIAGNOSIS — J45909 Unspecified asthma, uncomplicated: Secondary | ICD-10-CM | POA: Diagnosis not present

## 2018-02-18 DIAGNOSIS — F17210 Nicotine dependence, cigarettes, uncomplicated: Secondary | ICD-10-CM | POA: Diagnosis not present

## 2018-02-18 MED ORDER — IBUPROFEN 600 MG PO TABS: 600.0000 mg | ORAL_TABLET | Freq: Four times a day (QID) | ORAL | 0 refills | Status: DC | PRN

## 2018-02-18 MED ORDER — IBUPROFEN 800 MG PO TABS
800.0000 mg | ORAL_TABLET | Freq: Once | ORAL | Status: AC
  Administered 2018-02-18: 800 mg via ORAL
  Filled 2018-02-18: qty 1

## 2018-02-18 NOTE — ED Provider Notes (Signed)
[name] EMERGENCY DEPARTMENT Provider Note   CSN: [id number] Arrival date & time: 02/18/18  4098     History   Chief Complaint Chief Complaint  Patient presents with  . Leg Pain    HPI Kenneth Lane is a 32 y.o. male.  HPI  32 year old male presents 24 hours after having an armoire fall onto his left knee.  This was acute in onset, the pain is been persistent, he has had difficulty ambulating because of the pain, denies swelling redness rashes or lacerations.  He has no numbness or weakness distal to this injury.  No medications given prior to arrival.  Denies prior injury.  Past Medical History:  Diagnosis Date  . Anxiety   . Asthma   . Bradycardia   . Seasonal allergies     There are no active problems to display for this patient.   Past Surgical History:  Procedure Laterality Date  . DENTAL SURGERY          Home Medications    Prior to Admission medications   Medication Sig Start Date End Date Taking? Authorizing Provider  albuterol (PROVENTIL HFA;VENTOLIN HFA) 108 (90 Base) MCG/ACT inhaler Inhale 1-2 puffs into the lungs every 6 (six) hours as needed for wheezing or shortness of breath.    [provider]  ibuprofen (ADVIL,MOTRIN) 600 MG tablet Take 1 tablet (600 mg total) by mouth every 6 (six) hours as needed. 02/18/18   Eber Hong, MD  LORazepam (ATIVAN) 1 MG tablet Take 1 tablet (1 mg total) by mouth 3 (three) times daily as needed for anxiety. 01/22/17   Dione Booze, MD  omeprazole (PRILOSEC) 20 MG capsule Take 20 mg by mouth daily as needed. For upset stomach   10/20/11  [provider]  sucralfate (CARAFATE) 1 G tablet Take 1 tablet (1 g total) by mouth 4 (four) times daily. 06/08/11 10/20/11  Eber Hong, MD    Family History Family History  Problem Relation Age of Onset  . Cancer Other   . Hypertension Other     Social History Social History   Tobacco Use  . Smoking status: Current Every Day  Smoker    Packs/day: 1.00    Types: Cigarettes  . Smokeless tobacco: Never Used  Substance Use Topics  . Alcohol use: Yes  . Drug use: Yes    Types: Marijuana     Allergies   Shellfish-derived products   Review of Systems Review of Systems  Musculoskeletal: Positive for arthralgias ( knee pain).  Skin: Negative for rash and wound.  Neurological: Negative for weakness and numbness.     Physical Exam Updated Vital Signs BP 126/82   Pulse 73   Temp 97.9 F (36.6 C) (Oral)   Resp 16   SpO2 99%   Physical Exam  Constitutional: He appears well-developed and well-nourished.  HENT:  Head: Normocephalic and atraumatic.  Eyes: Conjunctivae are normal. Right eye exhibits no discharge. Left eye exhibits no discharge.  Pulmonary/Chest: Effort normal. No respiratory distress.  Musculoskeletal:  No deformity seen, the patient has good range of motion of the knee but with pain, there is tenderness over the medial knee on the left side, there is no obvious rash contusions ecchymosis or deformity.  He is able to straight leg raise.  With lateral and medial joint stresses he does have pain over the medial surface but no obvious laxity.  There is no posterior or anterior laxity.  Ankle and hip joints on left side  also normal-appearing  Neurological: He is alert. Coordination normal.  Skin: Skin is warm and dry. No rash noted. He is not diaphoretic. No erythema.  Psychiatric: He has a normal mood and affect.  Nursing note and vitals reviewed.    ED Treatments / Results  Labs (all labs ordered are listed, but only abnormal results are displayed) Labs Reviewed - No data to display  EKG None  Radiology Dg Knee Complete 4 Views Left  Result Date: 02/18/2018 CLINICAL DATA:  Blunt trauma to the left knee. Limited range of motion. EXAM: LEFT KNEE - COMPLETE 4+ VIEW COMPARISON:  None. FINDINGS: No evidence of fracture, dislocation, or joint effusion. No evidence of arthropathy or other  focal bone abnormality. Soft tissues are unremarkable. IMPRESSION: Negative. Electronically Signed   By: Francene BoyersJames  Maxwell M.D.   On: 02/18/2018 09:08    Procedures Procedures (including critical care time)  Medications Ordered in ED Medications  ibuprofen (ADVIL,MOTRIN) tablet 800 mg (800 mg Oral Given 02/18/18 0834)     Initial Impression / Assessment and Plan / ED Course  I have reviewed the triage vital signs and the nursing notes.  Pertinent labs & imaging results that were available during my care of the patient were reviewed by me and considered in my medical decision making (see chart for details).  Clinical Course as of Feb 18 922  Fri Feb 18, 2018  0919 I have personally viewed the complete left knee x-rays, there is no signs of fractures dislocations effusions or soft tissue injury based on the x-ray.  I agree with the radiologist that there is no acute findings.  I have reviewed the patient's vital signs, the patient's exam is consistent with a soft tissue injury, this may be ligamentous, he will need follow-up with an orthopedist if no better.  He will be given ice, anti-inflammatories and encouraged to follow-up.   [BM]    Clinical Course User Index [BM] Eber HongMiller, Shamaya Kauer, MD   The patient has no evidence of open injuries, he does have a contusion to the left knee, imaging ordered, suspect contusion or internal derangement however I do not see any signs at all of external injury swelling bruising etc.  Anti-inflammatories and ice pack offered  The patient was informed of his results, offered ice and anti-inflammatories, he agreed, willing to follow-up as needed.  Requesting work note   Final Clinical Impressions(s) / ED Diagnoses   Final diagnoses:  Acute pain of left knee    ED Discharge Orders        Ordered    ibuprofen (ADVIL,MOTRIN) 600 MG tablet  Every 6 hours PRN     02/18/18 0921       Eber HongMiller, Jade Burkard, MD 02/18/18 [phone]

## 2018-02-18 NOTE — Discharge Instructions (Signed)
There is no signs of fracture on your x-rays.  Please use ibuprofen, apply ice, see your doctor within 1 week if no better.

## 2018-02-18 NOTE — ED Notes (Signed)
Patient transported to X-ray 

## 2018-02-18 NOTE — ED Triage Notes (Signed)
Patient complains of left knee pain after an armoire fell on his left leg yesterday. Range of motion limited by pain. Patient alert, oriented, and in no apparent distress at this time.

## 2018-03-30 ENCOUNTER — Emergency Department (HOSPITAL_COMMUNITY): Payer: Medicare Other

## 2018-03-30 ENCOUNTER — Encounter (HOSPITAL_COMMUNITY): Payer: Self-pay | Admitting: Emergency Medicine

## 2018-03-30 ENCOUNTER — Other Ambulatory Visit: Payer: Self-pay

## 2018-03-30 ENCOUNTER — Emergency Department (HOSPITAL_COMMUNITY)
Admission: EM | Admit: 2018-03-30 | Discharge: 2018-03-30 | Disposition: A | Discharge status: Home / Self Care | Payer: Medicare Other | Attending: Emergency Medicine | Admitting: Emergency Medicine

## 2018-03-30 DIAGNOSIS — J45909 Unspecified asthma, uncomplicated: Secondary | ICD-10-CM | POA: Insufficient documentation

## 2018-03-30 DIAGNOSIS — Z79899 Other long term (current) drug therapy: Secondary | ICD-10-CM | POA: Diagnosis not present

## 2018-03-30 DIAGNOSIS — R52 Pain, unspecified: Secondary | ICD-10-CM | POA: Diagnosis not present

## 2018-03-30 DIAGNOSIS — F17210 Nicotine dependence, cigarettes, uncomplicated: Secondary | ICD-10-CM | POA: Insufficient documentation

## 2018-03-30 DIAGNOSIS — R05 Cough: Secondary | ICD-10-CM | POA: Diagnosis not present

## 2018-03-30 DIAGNOSIS — R0602 Shortness of breath: Secondary | ICD-10-CM | POA: Diagnosis not present

## 2018-03-30 DIAGNOSIS — R457 State of emotional shock and stress, unspecified: Secondary | ICD-10-CM | POA: Diagnosis not present

## 2018-03-30 DIAGNOSIS — R1012 Left upper quadrant pain: Secondary | ICD-10-CM

## 2018-03-30 DIAGNOSIS — R079 Chest pain, unspecified: Secondary | ICD-10-CM | POA: Diagnosis not present

## 2018-03-30 DIAGNOSIS — R1111 Vomiting without nausea: Secondary | ICD-10-CM | POA: Diagnosis not present

## 2018-03-30 LAB — URINALYSIS, ROUTINE W REFLEX MICROSCOPIC
Bilirubin Urine: NEGATIVE
GLUCOSE, UA: NEGATIVE mg/dL
HGB URINE DIPSTICK: NEGATIVE
KETONES UR: NEGATIVE mg/dL
Leukocytes, UA: NEGATIVE
Nitrite: NEGATIVE
PH: 7 (ref 5.0–8.0)
PROTEIN: NEGATIVE mg/dL
Specific Gravity, Urine: 1.026 (ref 1.005–1.030)

## 2018-03-30 LAB — COMPREHENSIVE METABOLIC PANEL
ALBUMIN: 4.1 g/dL (ref 3.5–5.0)
ALT: 12 U/L (ref 0–44)
AST: 17 U/L (ref 15–41)
Alkaline Phosphatase: 69 U/L (ref 38–126)
Anion gap: 9 (ref 5–15)
BUN: 17 mg/dL (ref 6–20)
CHLORIDE: 108 mmol/L (ref 98–111)
CO2: 23 mmol/L (ref 22–32)
CREATININE: 0.95 mg/dL (ref 0.61–1.24)
Calcium: 9.3 mg/dL (ref 8.9–10.3)
GFR calc Af Amer: >60 mL/min (ref >60)
GLUCOSE: 101 mg/dL — AB (ref 70–99)
Potassium: 3.6 mmol/L (ref 3.5–5.1)
Sodium: 140 mmol/L (ref 135–145)
Total Bilirubin: 0.8 mg/dL (ref 0.3–1.2)
Total Protein: 6.7 g/dL (ref 6.5–8.1)

## 2018-03-30 LAB — CBC
HEMATOCRIT: 44.5 % (ref 39.0–52.0)
Hemoglobin: 15.2 g/dL (ref 13.0–17.0)
MCH: 29.3 pg (ref 26.0–34.0)
MCHC: 34.2 g/dL (ref 30.0–36.0)
MCV: 85.7 fL (ref 78.0–100.0)
PLATELETS: 229 10*3/uL (ref 150–400)
RBC: 5.19 MIL/uL (ref 4.22–5.81)
RDW: 13.2 % (ref 11.5–15.5)
WBC: 6.8 10*3/uL (ref 4.0–10.5)

## 2018-03-30 LAB — LIPASE, BLOOD: LIPASE: 27 U/L (ref 11–51)

## 2018-03-30 NOTE — ED Triage Notes (Signed)
Pt arrived [facility] from home for reports of LUQ pain when he takes a deep breath. States that the pain is similar to the pain he has when he has panic attacks. Also reports vomiting yesterday.  BP 126/72  P 70 RR 18 O2 98%

## 2018-03-30 NOTE — ED Provider Notes (Signed)
[name] EMERGENCY DEPARTMENT Provider Note   CSN: [id number] Arrival date & time: 03/30/18  0122     History   Chief Complaint Chief Complaint  Patient presents with  . Anxiety  . Abdominal Pain    HPI TYREKE BASSINGER is a 32 y.o. male.  HPI  32 year old male with a history of anxiety presents the emergency department with left upper quadrant pain which began this evening.  He states this is what his symptoms feel like when he has panic attack symptoms.  He is feeling somewhat better at this time.  He reports some mild pain with deep breathing.  No significant shortness of breath.  No fevers or chills.  Denies nausea vomiting or diarrhea.  Symptoms are mild in severity and improving.   Past Medical History:  Diagnosis Date  . Anxiety   . Asthma   . Bradycardia   . Seasonal allergies     There are no active problems to display for this patient.   Past Surgical History:  Procedure Laterality Date  . DENTAL SURGERY          Home Medications    Prior to Admission medications   Medication Sig Start Date End Date Taking? Authorizing Provider  albuterol (PROVENTIL HFA;VENTOLIN HFA) 108 (90 Base) MCG/ACT inhaler Inhale 1-2 puffs into the lungs every 6 (six) hours as needed for wheezing or shortness of breath.    [provider]  ibuprofen (ADVIL,MOTRIN) 600 MG tablet Take 1 tablet (600 mg total) by mouth every 6 (six) hours as needed. 02/18/18   Eber Hong, MD  LORazepam (ATIVAN) 1 MG tablet Take 1 tablet (1 mg total) by mouth 3 (three) times daily as needed for anxiety. 01/22/17   Dione Booze, MD  omeprazole (PRILOSEC) 20 MG capsule Take 20 mg by mouth daily as needed. For upset stomach   10/20/11  [provider]  sucralfate (CARAFATE) 1 G tablet Take 1 tablet (1 g total) by mouth 4 (four) times daily. 06/08/11 10/20/11  Eber Hong, MD    Family History Family History  Problem Relation Age of Onset  . Cancer Other   .  Hypertension Other     Social History Social History   Tobacco Use  . Smoking status: Current Every Day Smoker    Packs/day: 1.00    Types: Cigarettes  . Smokeless tobacco: Never Used  Substance Use Topics  . Alcohol use: Yes  . Drug use: Yes    Types: Marijuana     Allergies   Shellfish-derived products   Review of Systems Review of Systems  All other systems reviewed and are negative.    Physical Exam Updated Vital Signs BP 104/62 (BP Location: Left Arm)   Pulse 61   Temp (!) 97.5 F (36.4 C) (Oral)   Resp 18   SpO2 96%   Physical Exam  Constitutional: He is oriented to person, place, and time. He appears well-developed and well-nourished.  HENT:  Head: Normocephalic and atraumatic.  Eyes: EOM are normal.  Neck: Normal range of motion.  Cardiovascular: Normal rate, regular rhythm, normal heart sounds and intact distal pulses.  Pulmonary/Chest: Effort normal and breath sounds normal. No respiratory distress.  Abdominal: Soft. He exhibits no distension. There is no tenderness.  Musculoskeletal: Normal range of motion.  Neurological: He is alert and oriented to person, place, and time.  Skin: Skin is warm and dry.  Psychiatric: He has a normal mood and affect. Judgment normal.  Nursing note  and vitals reviewed.    ED Treatments / Results  Labs (all labs ordered are listed, but only abnormal results are displayed) Labs Reviewed  COMPREHENSIVE METABOLIC PANEL - Abnormal; Notable for the following components:      Result Value   Glucose, Bld 101 (*)    All other components within normal limits  LIPASE, BLOOD  CBC  URINALYSIS, ROUTINE W REFLEX MICROSCOPIC    EKG None  Radiology Dg Chest 2 View  Result Date: 03/30/2018 CLINICAL DATA:  Initial evaluation for acute shortness of breath, left lower chest pain for several days. EXAM: CHEST - 2 VIEW COMPARISON:  Prior radiograph from 01/29/2018. FINDINGS: The cardiac and mediastinal silhouettes are stable  in size and contour, and remain within normal limits. The lungs are normally inflated. No airspace consolidation, pleural effusion, or pulmonary edema is identified. There is no pneumothorax. No acute osseous abnormality identified. IMPRESSION: No active cardiopulmonary disease. Electronically Signed   By: Rise Mu M.D.   On: 03/30/2018 04:44    Procedures Procedures (including critical care time)  Medications Ordered in ED Medications - No data to display   Initial Impression / Assessment and Plan / ED Course  I have reviewed the triage vital signs and the nursing notes.  Pertinent labs & imaging results that were available during my care of the patient were reviewed by me and considered in my medical decision making (see chart for details).     Work-up in the emergency department without significant abnormality.  Primary care follow-up.  Patient understands to return in the emergency department for new or worsening symptoms and unclear etiology of the patient's symptoms  Final Clinical Impressions(s) / ED Diagnoses   Final diagnoses:  Left upper quadrant abdominal pain of unknown etiology    ED Discharge Orders    None       Azalia Bilis, MD 03/30/18 [phone]

## 2018-04-05 DIAGNOSIS — R52 Pain, unspecified: Secondary | ICD-10-CM | POA: Diagnosis not present

## 2018-04-05 DIAGNOSIS — I10 Essential (primary) hypertension: Secondary | ICD-10-CM | POA: Diagnosis not present

## 2018-04-06 ENCOUNTER — Encounter (HOSPITAL_COMMUNITY): Payer: Self-pay | Admitting: Emergency Medicine

## 2018-04-06 ENCOUNTER — Emergency Department (HOSPITAL_COMMUNITY)
Admission: EM | Admit: 2018-04-06 | Discharge: 2018-04-06 | Disposition: A | Discharge status: Home / Self Care | Payer: Medicare Other | Attending: Emergency Medicine | Admitting: Emergency Medicine

## 2018-04-06 DIAGNOSIS — Z5321 Procedure and treatment not carried out due to patient leaving prior to being seen by health care provider: Secondary | ICD-10-CM | POA: Diagnosis not present

## 2018-04-06 DIAGNOSIS — F410 Panic disorder [episodic paroxysmal anxiety] without agoraphobia: Secondary | ICD-10-CM | POA: Insufficient documentation

## 2018-04-06 NOTE — ED Triage Notes (Signed)
BIB EMS from home, pt reports several panic attacks over the past several days. Pt states he jumped out of bed so quickly tonight that the hit his L rib cage and R thumb.

## 2018-04-06 NOTE — ED Notes (Signed)
Pt not present in lobby, called multiple times for treatment room. LWBS.

## 2018-04-06 NOTE — ED Notes (Signed)
No answer in lobby x1

## 2018-04-26 ENCOUNTER — Emergency Department (HOSPITAL_COMMUNITY): Payer: Medicare Other

## 2018-04-26 ENCOUNTER — Encounter (HOSPITAL_COMMUNITY): Payer: Self-pay | Admitting: *Deleted

## 2018-04-26 ENCOUNTER — Emergency Department (HOSPITAL_COMMUNITY)
Admission: EM | Admit: 2018-04-26 | Discharge: 2018-04-26 | Disposition: A | Discharge status: Home / Self Care | Payer: Medicare Other | Attending: Emergency Medicine | Admitting: Emergency Medicine

## 2018-04-26 ENCOUNTER — Other Ambulatory Visit: Payer: Self-pay

## 2018-04-26 DIAGNOSIS — R079 Chest pain, unspecified: Secondary | ICD-10-CM | POA: Diagnosis not present

## 2018-04-26 DIAGNOSIS — Z79899 Other long term (current) drug therapy: Secondary | ICD-10-CM | POA: Insufficient documentation

## 2018-04-26 DIAGNOSIS — J45909 Unspecified asthma, uncomplicated: Secondary | ICD-10-CM | POA: Diagnosis not present

## 2018-04-26 DIAGNOSIS — R0602 Shortness of breath: Secondary | ICD-10-CM | POA: Diagnosis not present

## 2018-04-26 DIAGNOSIS — I499 Cardiac arrhythmia, unspecified: Secondary | ICD-10-CM | POA: Diagnosis not present

## 2018-04-26 DIAGNOSIS — R0789 Other chest pain: Secondary | ICD-10-CM | POA: Diagnosis not present

## 2018-04-26 DIAGNOSIS — F17210 Nicotine dependence, cigarettes, uncomplicated: Secondary | ICD-10-CM | POA: Insufficient documentation

## 2018-04-26 DIAGNOSIS — F410 Panic disorder [episodic paroxysmal anxiety] without agoraphobia: Secondary | ICD-10-CM

## 2018-04-26 DIAGNOSIS — R05 Cough: Secondary | ICD-10-CM | POA: Diagnosis not present

## 2018-04-26 LAB — I-STAT TROPONIN, ED: Troponin i, poc: 0 ng/mL (ref 0.00–0.08)

## 2018-04-26 MED ORDER — KETOROLAC TROMETHAMINE 30 MG/ML IJ SOLN
30.0000 mg | Freq: Once | INTRAMUSCULAR | Status: AC
  Administered 2018-04-26: 30 mg via INTRAMUSCULAR
  Filled 2018-04-26: qty 1

## 2018-04-26 MED ORDER — LORAZEPAM 1 MG PO TABS
1.0000 mg | ORAL_TABLET | Freq: Once | ORAL | Status: AC
  Administered 2018-04-26: 1 mg via ORAL
  Filled 2018-04-26: qty 1

## 2018-04-26 NOTE — Discharge Instructions (Signed)
He was seen today for chest pain and anxiety.  You need to follow-up with the primary physician regarding ongoing symptoms.  You may need to be started on an antianxiety medication.

## 2018-04-26 NOTE — ED Provider Notes (Signed)
[name] EMERGENCY DEPARTMENT Provider Note   CSN: [id number] Arrival date & time: 04/26/18  0524     History   Chief Complaint Chief Complaint  Patient presents with  . Cough  . Shortness of Breath    HPI DANNON NGUYENTHI is a 32 y.o. male.  HPI  This is a 32 year old male who presents with chest pain and anxiety.  Patient reports that he woke up from sleep feeling anxious.  He had anterior sharp chest pain that was nonradiating.  Nothing seems to make it better or worse.  Rates his pain now at 4 out of 10.  Denies any shortness of breath.  Has not taken anything for his pain.  He has had similar episodes in the past which she relates to anxiety.  Denies any recent coughs or fevers.  Denies any leg swelling or history of blood clots.  Denies any increased stressors at home.  Past Medical History:  Diagnosis Date  . Anxiety   . Asthma   . Bradycardia   . Seasonal allergies     There are no active problems to display for this patient.   Past Surgical History:  Procedure Laterality Date  . DENTAL SURGERY          Home Medications    Prior to Admission medications   Medication Sig Start Date End Date Taking? Authorizing Provider  albuterol (PROVENTIL HFA;VENTOLIN HFA) 108 (90 Base) MCG/ACT inhaler Inhale 1-2 puffs into the lungs every 6 (six) hours as needed for wheezing or shortness of breath.    [provider]  ibuprofen (ADVIL,MOTRIN) 600 MG tablet Take 1 tablet (600 mg total) by mouth every 6 (six) hours as needed. 02/18/18   Eber Hong, MD  LORazepam (ATIVAN) 1 MG tablet Take 1 tablet (1 mg total) by mouth 3 (three) times daily as needed for anxiety. 01/22/17   Dione Booze, MD  omeprazole (PRILOSEC) 20 MG capsule Take 20 mg by mouth daily as needed. For upset stomach   10/20/11  [provider]  sucralfate (CARAFATE) 1 G tablet Take 1 tablet (1 g total) by mouth 4 (four) times daily. 06/08/11 10/20/11  Eber Hong, MD     Family History Family History  Problem Relation Age of Onset  . Cancer Other   . Hypertension Other     Social History Social History   Tobacco Use  . Smoking status: Current Every Day Smoker    Packs/day: 1.00    Types: Cigarettes  . Smokeless tobacco: Never Used  Substance Use Topics  . Alcohol use: Yes  . Drug use: Yes    Types: Marijuana     Allergies   Shellfish-derived products   Review of Systems Review of Systems  Constitutional: Negative for fever.  Respiratory: Negative for cough and shortness of breath.   Cardiovascular: Positive for chest pain. Negative for leg swelling.  Gastrointestinal: Negative for abdominal pain, nausea and vomiting.  Psychiatric/Behavioral: The patient is nervous/anxious.      Physical Exam Updated Vital Signs BP 108/75   Pulse (!) 48   Temp 97.7 F (36.5 C) (Axillary)   Resp (!) 21   Ht 1.651 m (5\' 5" )   Wt 60.8 kg   SpO2 98%   BMI 22.30 kg/m   Physical Exam  Constitutional: He is oriented to person, place, and time. He appears well-developed and well-nourished.  HENT:  Head: Normocephalic and atraumatic.  Neck: Neck supple.  Cardiovascular: Normal rate, regular rhythm and normal  heart sounds.  No murmur heard. Pulmonary/Chest: Effort normal and breath sounds normal. No respiratory distress. He has no wheezes.  Abdominal: Soft. Bowel sounds are normal. There is no tenderness. There is no rebound.  Musculoskeletal: He exhibits no edema.  Lymphadenopathy:    He has no cervical adenopathy.  Neurological: He is alert and oriented to person, place, and time.  Skin: Skin is warm and dry.  Psychiatric: He has a normal mood and affect.  Nursing note and vitals reviewed.    ED Treatments / Results  Labs (all labs ordered are listed, but only abnormal results are displayed) Labs Reviewed  I-STAT TROPONIN, ED    EKG EKG Interpretation  Date/Time:  Tuesday April 26 2018 05:28:03 EDT Ventricular Rate:   57 PR Interval:    QRS Duration: 99 QT Interval:  397 QTC Calculation: 387 R Axis:   49 Text Interpretation:  Sinus arrhythmia RSR' in V1 or V2, probably normal variant ST elev, probable normal early repol pattern Similar to EKG Oct 2018 Confirmed by Ross Marcus ([id number]) on 04/26/2018 5:32:11 AM   Radiology Dg Chest 2 View  Result Date: 04/26/2018 CLINICAL DATA:  Chest pain, shortness of breath and cough. EXAM: CHEST - 2 VIEW COMPARISON:  03/30/2018 FINDINGS: The cardiomediastinal contours are normal. The lungs are clear. Pulmonary vasculature is normal. No consolidation, pleural effusion, or pneumothorax. No acute osseous abnormalities are seen. IMPRESSION: Negative radiographs of the chest. Electronically Signed   By: Narda Rutherford M.D.   On: 04/26/2018 06:20    Procedures Procedures (including critical care time)  Medications Ordered in ED Medications  ketorolac (TORADOL) 30 MG/ML injection 30 mg (30 mg Intramuscular Given 04/26/18 0617)  LORazepam (ATIVAN) tablet 1 mg (1 mg Oral Given 04/26/18 0601)     Initial Impression / Assessment and Plan / ED Course  I have reviewed the triage vital signs and the nursing notes.  Pertinent labs & imaging results that were available during my care of the patient were reviewed by me and considered in my medical decision making (see chart for details).     Patient presents with chest pain.  He is overall nontoxic-appearing on exam and vital signs are reassuring.  EKG shows no signs of ischemia.  Patient reports history of the same in the setting of anxiety and panic attack.  He states that he feels anxious at this time.  Chest x-ray shows no evidence of pneumothorax or pneumonia.  Troponin is negative.  He is PERC negative.  Reports improvement of symptoms with Toradol and Ativan.  Doubt acute emergent process.  Recommend follow-up with primary physician regarding possible initiation of anxiety medication.  After history, exam, and  medical workup I feel the patient has been appropriately medically screened and is safe for discharge home. Pertinent diagnoses were discussed with the patient. Patient was given return precautions.   Final Clinical Impressions(s) / ED Diagnoses   Final diagnoses:  Atypical chest pain  Anxiety attack    ED Discharge Orders    None       Shon Baton, MD 04/26/18 [phone]

## 2018-04-26 NOTE — ED Notes (Signed)
ED Provider at bedside. 

## 2018-04-26 NOTE — ED Triage Notes (Addendum)
Per EMS, patient report non productive cough for the last 2 days, with SOB.  But woke up around 0445 with heavy breathing. Hx of Asthma. Report substernal chest pains as well rated 4/10. EMS reported patient mentioning having panic attack but resolved . EKG done by EMS unremarkable, HR 55-60 and BP 111/80. A/O x4 currently.

## 2018-05-08 DIAGNOSIS — R112 Nausea with vomiting, unspecified: Secondary | ICD-10-CM | POA: Diagnosis not present

## 2018-05-08 DIAGNOSIS — R001 Bradycardia, unspecified: Secondary | ICD-10-CM | POA: Diagnosis not present

## 2018-05-09 ENCOUNTER — Emergency Department (HOSPITAL_COMMUNITY): Payer: Medicare Other

## 2018-05-09 ENCOUNTER — Other Ambulatory Visit: Payer: Self-pay

## 2018-05-09 ENCOUNTER — Emergency Department (HOSPITAL_COMMUNITY)
Admission: EM | Admit: 2018-05-09 | Discharge: 2018-05-09 | Disposition: A | Discharge status: Home / Self Care | Payer: Medicare Other | Attending: Emergency Medicine | Admitting: Emergency Medicine

## 2018-05-09 DIAGNOSIS — R079 Chest pain, unspecified: Secondary | ICD-10-CM | POA: Insufficient documentation

## 2018-05-09 DIAGNOSIS — R05 Cough: Secondary | ICD-10-CM | POA: Diagnosis not present

## 2018-05-09 DIAGNOSIS — R1084 Generalized abdominal pain: Secondary | ICD-10-CM | POA: Diagnosis not present

## 2018-05-09 DIAGNOSIS — F17210 Nicotine dependence, cigarettes, uncomplicated: Secondary | ICD-10-CM | POA: Diagnosis not present

## 2018-05-09 DIAGNOSIS — J45909 Unspecified asthma, uncomplicated: Secondary | ICD-10-CM | POA: Diagnosis not present

## 2018-05-09 DIAGNOSIS — Z79899 Other long term (current) drug therapy: Secondary | ICD-10-CM | POA: Diagnosis not present

## 2018-05-09 DIAGNOSIS — R1111 Vomiting without nausea: Secondary | ICD-10-CM | POA: Diagnosis not present

## 2018-05-09 DIAGNOSIS — R52 Pain, unspecified: Secondary | ICD-10-CM | POA: Diagnosis not present

## 2018-05-09 DIAGNOSIS — R001 Bradycardia, unspecified: Secondary | ICD-10-CM | POA: Diagnosis not present

## 2018-05-09 DIAGNOSIS — R059 Cough, unspecified: Secondary | ICD-10-CM

## 2018-05-09 DIAGNOSIS — R110 Nausea: Secondary | ICD-10-CM | POA: Diagnosis not present

## 2018-05-09 DIAGNOSIS — R112 Nausea with vomiting, unspecified: Secondary | ICD-10-CM | POA: Insufficient documentation

## 2018-05-09 LAB — BASIC METABOLIC PANEL
ANION GAP: 7 (ref 5–15)
BUN: 11 mg/dL (ref 6–20)
CALCIUM: 9.5 mg/dL (ref 8.9–10.3)
CO2: 23 mmol/L (ref 22–32)
CREATININE: 0.79 mg/dL (ref 0.61–1.24)
Chloride: 108 mmol/L (ref 98–111)
GFR calc Af Amer: >60 mL/min (ref >60)
Glucose, Bld: 110 mg/dL — ABNORMAL HIGH (ref 70–99)
POTASSIUM: 3.7 mmol/L (ref 3.5–5.1)
Sodium: 138 mmol/L (ref 135–145)

## 2018-05-09 LAB — CBC
HCT: 44.8 % (ref 39.0–52.0)
Hemoglobin: 14.7 g/dL (ref 13.0–17.0)
MCH: 27.9 pg (ref 26.0–34.0)
MCHC: 32.8 g/dL (ref 30.0–36.0)
MCV: 85.2 fL (ref 80.0–100.0)
NRBC: 0 % (ref 0.0–0.2)
PLATELETS: 221 10*3/uL (ref 150–400)
RBC: 5.26 MIL/uL (ref 4.22–5.81)
RDW: 13.4 % (ref 11.5–15.5)
WBC: 6.5 10*3/uL (ref 4.0–10.5)

## 2018-05-09 LAB — I-STAT TROPONIN, ED: Troponin i, poc: 0 ng/mL (ref 0.00–0.08)

## 2018-05-09 LAB — HEPATIC FUNCTION PANEL
ALT: 19 U/L (ref 0–44)
AST: 23 U/L (ref 15–41)
Albumin: 3.7 g/dL (ref 3.5–5.0)
Alkaline Phosphatase: 67 U/L (ref 38–126)
BILIRUBIN INDIRECT: 0.5 mg/dL (ref 0.3–0.9)
Bilirubin, Direct: 0.1 mg/dL (ref 0.0–0.2)
TOTAL PROTEIN: 6.4 g/dL — AB (ref 6.5–8.1)
Total Bilirubin: 0.6 mg/dL (ref 0.3–1.2)

## 2018-05-09 LAB — LIPASE, BLOOD: LIPASE: 29 U/L (ref 11–51)

## 2018-05-09 NOTE — ED Triage Notes (Signed)
Pt arrives with 3day hx cough, 2 day hx nausea, 2 episodes of vomitting today. C/o L sided CP, some SOB.  Preschool-aged son same symptoms x4days  VS [facility] for [facility]

## 2018-05-09 NOTE — Discharge Instructions (Signed)
Your labs and chest x-ray today were normal. Follow-up with your primary care doctor. Return here for any new/acute changes.

## 2018-05-09 NOTE — ED Provider Notes (Signed)
[name] EMERGENCY DEPARTMENT Provider Note   CSN: [id number] Arrival date & time: 05/09/18  0041     History   Chief Complaint Chief Complaint  Patient presents with  . Abdominal Pain  . Cough  . Chest Pain    HPI Kenneth Lane is a 32 y.o. male.  The history is provided by the patient and medical records.  Abdominal Pain   Associated symptoms include nausea and vomiting.  Cough  Associated symptoms include chest pain.  Chest Pain   Associated symptoms include abdominal pain, cough, nausea and vomiting.    32 y.o. M with hx of anxiety, asthma, bradycardia, seasonal allergies, presenting to the ED for various complaints.  Patient reports 3 days of productive cough with green sputum, 2 days of nausea, and 2 episode of non-bloody, non-bilious emesis today.  States overall he is feeling better at this time but came in due to chest pain. States he has been having some left sided chest pain and SOB, worse after coughing.  No diaphoresis, dizziness, or syncope.  His son who is in preschool has been sick with similar symptoms-- reports multiple kids at his school have been sick recently and thinks he caught it from him.  Patient is a heavy smoker.  No known cardiac history aside from baseline bradycardia.  He has not tried any meds PTA.  Past Medical History:  Diagnosis Date  . Anxiety   . Asthma   . Bradycardia   . Seasonal allergies     There are no active problems to display for this patient.   Past Surgical History:  Procedure Laterality Date  . DENTAL SURGERY          Home Medications    Prior to Admission medications   Medication Sig Start Date End Date Taking? Authorizing Provider  albuterol (PROVENTIL HFA;VENTOLIN HFA) 108 (90 Base) MCG/ACT inhaler Inhale 1-2 puffs into the lungs every 6 (six) hours as needed for wheezing or shortness of breath.    [provider]  ibuprofen (ADVIL,MOTRIN) 600 MG tablet Take 1 tablet (600 mg  total) by mouth every 6 (six) hours as needed. 02/18/18   Eber Hong, MD  LORazepam (ATIVAN) 1 MG tablet Take 1 tablet (1 mg total) by mouth 3 (three) times daily as needed for anxiety. 01/22/17   Dione Booze, MD  omeprazole (PRILOSEC) 20 MG capsule Take 20 mg by mouth daily as needed. For upset stomach   10/20/11  [provider]  sucralfate (CARAFATE) 1 G tablet Take 1 tablet (1 g total) by mouth 4 (four) times daily. 06/08/11 10/20/11  Eber Hong, MD    Family History Family History  Problem Relation Age of Onset  . Cancer Other   . Hypertension Other     Social History Social History   Tobacco Use  . Smoking status: Current Every Day Smoker    Packs/day: 1.00    Types: Cigarettes  . Smokeless tobacco: Never Used  Substance Use Topics  . Alcohol use: Yes  . Drug use: Yes    Types: Marijuana     Allergies   Shellfish-derived products   Review of Systems Review of Systems  Respiratory: Positive for cough.   Cardiovascular: Positive for chest pain.  Gastrointestinal: Positive for abdominal pain, nausea and vomiting.  All other systems reviewed and are negative.    Physical Exam Updated Vital Signs BP 110/79   Pulse (!) 48   Temp 98.3 F (36.8 C) (Oral)  Resp 19   Ht 5\' 5"  (1.651 m)   Wt 59 kg   SpO2 99%   BMI 21.63 kg/m   Physical Exam  Constitutional: He is oriented to person, place, and time. He appears well-developed and well-nourished.  HENT:  Head: Normocephalic and atraumatic.  Right Ear: Tympanic membrane and ear canal normal.  Left Ear: Tympanic membrane and ear canal normal.  Nose: Mucosal edema (mild) present.  Mouth/Throat: Uvula is midline, oropharynx is clear and moist and mucous membranes are normal.  Eyes: Pupils are equal, round, and reactive to light. Conjunctivae and EOM are normal.  Neck: Normal range of motion.  Cardiovascular: Normal rate, regular rhythm and normal heart sounds.  Pulmonary/Chest: Effort normal and  breath sounds normal. He has no decreased breath sounds. He has no wheezes. He has no rhonchi. He has no rales.  Lungs clear, no wheezes or rhonchi, dry cough when taking deep breaths, speaking in full sentences without difficulty  Abdominal: Soft. Bowel sounds are normal. There is no tenderness. There is no rigidity, no guarding and no tenderness at McBurney's point.  Musculoskeletal: Normal range of motion.  Neurological: He is alert and oriented to person, place, and time.  Skin: Skin is warm and dry.  Psychiatric:  Seems a little anxious  Nursing note and vitals reviewed.    ED Treatments / Results  Labs (all labs ordered are listed, but only abnormal results are displayed) Labs Reviewed  BASIC METABOLIC PANEL - Abnormal; Notable for the following components:      Result Value   Glucose, Bld 110 (*)    All other components within normal limits  HEPATIC FUNCTION PANEL - Abnormal; Notable for the following components:   Total Protein 6.4 (*)    All other components within normal limits  CBC  LIPASE, BLOOD  I-STAT TROPONIN, ED    EKG EKG Interpretation  Date/Time:  Monday May 09 2018 00:44:40 EDT Ventricular Rate:  49 PR Interval:    QRS Duration: 103 QT Interval:  431 QTC Calculation: 389 R Axis:   62 Text Interpretation:  Sinus bradycardia RSR' in V1 or V2, probably normal variant Probable left ventricular hypertrophy ST elev, probable normal early repol pattern When compared with ECG of 04/26/2018, No significant change was found Confirmed by Dione Booze ([id number]) on 05/09/2018 1:01:23 AM   Radiology Dg Chest 2 View  Result Date: 05/09/2018 CLINICAL DATA:  Three day history of cough and two days of nausea with two episodes of vomiting today. EXAM: CHEST - 2 VIEW COMPARISON:  None. FINDINGS: The heart size and mediastinal contours are within normal limits. Both lungs are clear. The visualized skeletal structures are unremarkable. IMPRESSION: No active cardiopulmonary  disease. Electronically Signed   By: Tollie Eth M.D.   On: 05/09/2018 01:42    Procedures Procedures (including critical care time)  Medications Ordered in ED Medications - No data to display   Initial Impression / Assessment and Plan / ED Course  I have reviewed the triage vital signs and the nursing notes.  Pertinent labs & imaging results that were available during my care of the patient were reviewed by me and considered in my medical decision making (see chart for details).  32 y.o M here with multiple concerns-- 3 days of productive cough, abdominal pain, nausea/vomiting x2 today.  States he is actually feeling better on arrival but came in due to chest pain and SOB.  On exam, patient afebrile, non-toxic.  Exam is overall benign aside from  dry cough and mild nasal congestion.  Abdomen soft, benign.  He is requesting to drink ginger-ale. EKG NSR, no acute ischemic changes.  Labs and CXR pending.  Will monitor.  Labs and chest x-ray overall reassuring.  Patient's vitals have remained stable.  He has been drinking ginger ale without issue.  Resting comfortably.  Suspect viral etiology, likely from his son.  Have offered medications to assist with symptomatic management, however patient declined.  He will follow-up closely with his primary care doctor.  He can return here for any new or worsening symptoms.  Final Clinical Impressions(s) / ED Diagnoses   Final diagnoses:  Cough  Non-intractable vomiting with nausea, unspecified vomiting type    ED Discharge Orders    None       Garlon Hatchet, PA-C 05/09/18 0342    Dione Booze, MD 05/09/18 0700

## 2018-06-11 ENCOUNTER — Emergency Department (HOSPITAL_COMMUNITY): Payer: Medicare Other

## 2018-06-11 ENCOUNTER — Other Ambulatory Visit: Payer: Self-pay

## 2018-06-11 ENCOUNTER — Encounter (HOSPITAL_COMMUNITY): Payer: Self-pay | Admitting: *Deleted

## 2018-06-11 ENCOUNTER — Emergency Department (HOSPITAL_COMMUNITY)
Admission: EM | Admit: 2018-06-11 | Discharge: 2018-06-11 | Disposition: A | Discharge status: Home / Self Care | Payer: Medicare Other | Attending: Emergency Medicine | Admitting: Emergency Medicine

## 2018-06-11 DIAGNOSIS — J45909 Unspecified asthma, uncomplicated: Secondary | ICD-10-CM | POA: Insufficient documentation

## 2018-06-11 DIAGNOSIS — Z79899 Other long term (current) drug therapy: Secondary | ICD-10-CM | POA: Insufficient documentation

## 2018-06-11 DIAGNOSIS — M791 Myalgia, unspecified site: Secondary | ICD-10-CM | POA: Insufficient documentation

## 2018-06-11 DIAGNOSIS — F17210 Nicotine dependence, cigarettes, uncomplicated: Secondary | ICD-10-CM | POA: Insufficient documentation

## 2018-06-11 DIAGNOSIS — J189 Pneumonia, unspecified organism: Secondary | ICD-10-CM | POA: Insufficient documentation

## 2018-06-11 DIAGNOSIS — R05 Cough: Secondary | ICD-10-CM | POA: Diagnosis not present

## 2018-06-11 DIAGNOSIS — J181 Lobar pneumonia, unspecified organism: Secondary | ICD-10-CM

## 2018-06-11 MED ORDER — PREDNISONE 10 MG PO TABS: 40.0000 mg | ORAL_TABLET | Freq: Every day | ORAL | 0 refills | Status: DC

## 2018-06-11 MED ORDER — ONDANSETRON 4 MG PO TBDP
4.0000 mg | ORAL_TABLET | Freq: Once | ORAL | Status: AC
  Administered 2018-06-11: 4 mg via ORAL
  Filled 2018-06-11: qty 1

## 2018-06-11 MED ORDER — PREDNISONE 20 MG PO TABS
40.0000 mg | ORAL_TABLET | Freq: Once | ORAL | Status: AC
  Administered 2018-06-11: 40 mg via ORAL
  Filled 2018-06-11: qty 2

## 2018-06-11 MED ORDER — IPRATROPIUM-ALBUTEROL 0.5-2.5 (3) MG/3ML IN SOLN
3.0000 mL | Freq: Once | RESPIRATORY_TRACT | Status: AC
  Administered 2018-06-11: 3 mL via RESPIRATORY_TRACT
  Filled 2018-06-11: qty 3

## 2018-06-11 MED ORDER — AZITHROMYCIN 250 MG PO TABS
500.0000 mg | ORAL_TABLET | Freq: Once | ORAL | Status: AC
  Administered 2018-06-11: 500 mg via ORAL
  Filled 2018-06-11: qty 2

## 2018-06-11 MED ORDER — ALBUTEROL SULFATE HFA 108 (90 BASE) MCG/ACT IN AERS
2.0000 | INHALATION_SPRAY | Freq: Once | RESPIRATORY_TRACT | Status: AC
  Administered 2018-06-11: 2 via RESPIRATORY_TRACT
  Filled 2018-06-11: qty 6.7

## 2018-06-11 MED ORDER — AZITHROMYCIN 250 MG PO TABS: 250.0000 mg | ORAL_TABLET | Freq: Once | ORAL | 0 refills | Status: AC

## 2018-06-11 NOTE — ED Triage Notes (Signed)
Pt going to xray  

## 2018-06-11 NOTE — ED Provider Notes (Signed)
[name] Provider [facility]   CSN: [id number] Arrival date & time: 06/11/18  1634     History   Chief Complaint Chief Complaint  Patient presents with  . Generalized Body Aches    HPI Kenneth Lane is a 32 y.o. male.  The history is provided by the patient. No language interpreter was used.   Kenneth Lane is a 32 y.o. male who presents to the Emergency Department complaining of cough. He presents to the emergency department complaining of one week of cough productive of yellow sputum. He also reports nasal congestion, sore throat and occasional emesis. He threw up to three times a day. He has mild occasional abdominal pain. No reports of fevers. He has a history of asthma but his inhaler ran out a few days ago. He has tried Mucinex today with no significant improvement in symptoms. He does smoke cigarettes but didn't smoke for the last two weeks. No alcohol or drug use. Past Medical History:  Diagnosis Date  . Anxiety   . Asthma   . Bradycardia   . Seasonal allergies     There are no active problems to display for this patient.   Past Surgical History:  Procedure Laterality Date  . DENTAL SURGERY          Home Medications    Prior to Admission medications   Medication Sig Start Date End Date Taking? Authorizing Provider  albuterol (PROVENTIL HFA;VENTOLIN HFA) 108 (90 Base) MCG/ACT inhaler Inhale 1-2 puffs into the lungs every 6 (six) hours as needed for wheezing or shortness of breath.    [provider]  azithromycin (ZITHROMAX) 250 MG tablet Take 1 tablet (250 mg total) by mouth once for 1 dose. 06/12/18 06/12/18  Tilden Fossaees, Lisabeth Mian, MD  ibuprofen (ADVIL,MOTRIN) 600 MG tablet Take 1 tablet (600 mg total) by mouth every 6 (six) hours as needed. 02/18/18   Eber HongMiller, Brian, MD  LORazepam (ATIVAN) 1 MG tablet Take 1 tablet (1 mg total) by mouth 3 (three) times daily as needed for anxiety. 01/22/17   Dione BoozeGlick, David, MD    predniSONE (DELTASONE) 10 MG tablet Take 4 tablets (40 mg total) by mouth daily. 06/11/18   Tilden Fossaees, Atalaya Zappia, MD  omeprazole (PRILOSEC) 20 MG capsule Take 20 mg by mouth daily as needed. For upset stomach   10/20/11  [provider]  sucralfate (CARAFATE) 1 G tablet Take 1 tablet (1 g total) by mouth 4 (four) times daily. 06/08/11 10/20/11  Eber HongMiller, Brian, MD    Family History Family History  Problem Relation Age of Onset  . Cancer Other   . Hypertension Other     Social History Social History   Tobacco Use  . Smoking status: Current Every Day Smoker    Packs/day: 1.00    Types: Cigarettes  . Smokeless tobacco: Never Used  Substance Use Topics  . Alcohol use: Yes  . Drug use: Yes    Types: Marijuana     Allergies   Shellfish-derived products   Review of Systems Review of Systems  All other systems reviewed and are negative.    Physical Exam Updated Vital Signs BP 131/84 (BP Location: Right Arm)   Pulse (!) 108   Temp 99.7 F (37.6 C) (Oral)   Resp 19   Ht 5\' 5"  (1.651 m)   Wt 63.5 kg   SpO2 95%   BMI 23.30 kg/m   Physical Exam  Constitutional: He is oriented to person, place, and time. He  appears well-developed and well-nourished.  HENT:  Head: Normocephalic and atraumatic.  Rhinorrhea.  Mild erythema in posterior OP without edema or exudates.   Cardiovascular: Normal rate and regular rhythm.  No murmur heard. Pulmonary/Chest: Effort normal. No respiratory distress.  Occasional end expiratory wheezes bilaterally  Abdominal: Soft. There is no tenderness. There is no rebound and no guarding.  Musculoskeletal: He exhibits no edema or tenderness.  Neurological: He is alert and oriented to person, place, and time.  Skin: Skin is warm and dry.  Psychiatric: He has a normal mood and affect. His behavior is normal.  Nursing note and vitals reviewed.    ED Treatments / Results  Labs (all labs ordered are listed, but only abnormal results are  displayed) Labs Reviewed - No data to display  EKG None  Radiology Dg Chest 2 View  Result Date: 06/11/2018 CLINICAL DATA:  Cough EXAM: CHEST - 2 VIEW COMPARISON:  05/09/2018 FINDINGS: Patchy opacity at the lingula. No pleural effusion. Normal heart size. No pneumothorax. IMPRESSION: Patchy airspace disease at the lingula, suspect for early pneumonia. Electronically Signed   By: Jasmine Pang M.D.   On: 06/11/2018 17:08    Procedures Procedures (including critical care time)  Medications Ordered in ED Medications  albuterol (PROVENTIL HFA;VENTOLIN HFA) 108 (90 Base) MCG/ACT inhaler 2 puff (has no administration in time range)  predniSONE (DELTASONE) tablet 40 mg (40 mg Oral Given 06/11/18 1704)  ipratropium-albuterol (DUONEB) 0.5-2.5 (3) MG/3ML nebulizer solution 3 mL (3 mLs Nebulization Given 06/11/18 1705)  ondansetron (ZOFRAN-ODT) disintegrating tablet 4 mg (4 mg Oral Given 06/11/18 1705)  azithromycin (ZITHROMAX) tablet 500 mg (500 mg Oral Given 06/11/18 1726)     Initial Impression / Assessment and Plan / ED Course  I have reviewed the triage vital signs and the nursing notes.  Pertinent labs & imaging results that were available during my care of the patient were reviewed by me and considered in my medical decision making (see chart for details).     Pt with history of asthma here for evaluation of multiple complaints. He is non-toxic appearing on examination with no respiratory distress. He does have occasional end expiratory wheezes. Following albuterol treatment he is feeling improved. Chest x-ray with possible developed pneumonia. He was treated with oral antibiotics, prednisone and albuterol. Plan to discharge home with outpatient follow-up as well as close return precautions. Presentation is not consistent with status asthmaticus, PE, CHF.  Final Clinical Impressions(s) / ED Diagnoses   Final diagnoses:  Community acquired pneumonia of left lower lobe of lung [facility])      ED Discharge Orders         Ordered    azithromycin (ZITHROMAX) 250 MG tablet   Once     06/11/18 1747    predniSONE (DELTASONE) 10 MG tablet  Daily     06/11/18 1747           Tilden Fossa, MD 06/11/18 1749

## 2018-06-11 NOTE — ED Notes (Signed)
Declined W/C at D/C and was escorted to lobby by RN. 

## 2018-06-11 NOTE — ED Notes (Signed)
Pt back from X-ray.  

## 2018-06-11 NOTE — ED Triage Notes (Signed)
The pt is c/o  Multiple symptoms cold cough with a headache .  Productive cough greenish  Head chest congestion he worked today  No fever reducer taken  Unknown temp

## 2018-06-24 ENCOUNTER — Emergency Department (HOSPITAL_COMMUNITY)
Admission: EM | Admit: 2018-06-24 | Discharge: 2018-06-24 | Disposition: A | Discharge status: Home / Self Care | Payer: Medicare Other | Attending: Emergency Medicine | Admitting: Emergency Medicine

## 2018-06-24 ENCOUNTER — Emergency Department (HOSPITAL_COMMUNITY): Payer: Medicare Other

## 2018-06-24 ENCOUNTER — Encounter (HOSPITAL_COMMUNITY): Payer: Self-pay

## 2018-06-24 DIAGNOSIS — Z79899 Other long term (current) drug therapy: Secondary | ICD-10-CM | POA: Diagnosis not present

## 2018-06-24 DIAGNOSIS — F410 Panic disorder [episodic paroxysmal anxiety] without agoraphobia: Secondary | ICD-10-CM | POA: Diagnosis not present

## 2018-06-24 DIAGNOSIS — F419 Anxiety disorder, unspecified: Secondary | ICD-10-CM | POA: Diagnosis not present

## 2018-06-24 DIAGNOSIS — F17210 Nicotine dependence, cigarettes, uncomplicated: Secondary | ICD-10-CM | POA: Insufficient documentation

## 2018-06-24 DIAGNOSIS — R0602 Shortness of breath: Secondary | ICD-10-CM | POA: Diagnosis not present

## 2018-06-24 DIAGNOSIS — R0781 Pleurodynia: Secondary | ICD-10-CM | POA: Diagnosis not present

## 2018-06-24 DIAGNOSIS — R52 Pain, unspecified: Secondary | ICD-10-CM | POA: Diagnosis not present

## 2018-06-24 DIAGNOSIS — R079 Chest pain, unspecified: Secondary | ICD-10-CM | POA: Diagnosis not present

## 2018-06-24 NOTE — ED Notes (Signed)
PT states understanding of care given, follow up care. PT ambulated from ED to car with a steady gait.  

## 2018-06-24 NOTE — ED Triage Notes (Signed)
Pt coming in today because pt had a panic attack and has been having left sided stomach pain that has been going on for a while now. Pt states he woke up out of his sleep and just had a panic attack.

## 2018-06-24 NOTE — ED Provider Notes (Signed)
[name] Provider Note   CSN: [id number] Arrival date & time: 06/24/18  0204     History   Chief Complaint Chief Complaint  Patient presents with  . Panic Attack    HPI Kenneth Lane is a 32 y.o. male.  Patient presents to the emergency department with a chief complaint of anxiety attack.  Patient states that he was recently diagnosed with pneumonia.  He has been taking his antibiotics as directed and has been improving.  He states that occasionally he gets twinges of pain in his left ribs.  This makes him anxious.  States that he awoke from sleep tonight having a panic attack because of this.  States that he wanted to come in to be evaluated.  Denies any new fever or new productive cough.  States that he has been improving otherwise.  The history is provided by the patient. No language interpreter was used.    Past Medical History:  Diagnosis Date  . Anxiety   . Asthma   . Bradycardia   . Seasonal allergies     There are no active problems to display for this patient.   Past Surgical History:  Procedure Laterality Date  . DENTAL SURGERY          Home Medications    Prior to Admission medications   Medication Sig Start Date End Date Taking? Authorizing Provider  albuterol (PROVENTIL HFA;VENTOLIN HFA) 108 (90 Base) MCG/ACT inhaler Inhale 1-2 puffs into the lungs every 6 (six) hours as needed for wheezing or shortness of breath.    [provider]  ibuprofen (ADVIL,MOTRIN) 600 MG tablet Take 1 tablet (600 mg total) by mouth every 6 (six) hours as needed. 02/18/18   Eber HongMiller, Brian, MD  LORazepam (ATIVAN) 1 MG tablet Take 1 tablet (1 mg total) by mouth 3 (three) times daily as needed for anxiety. 01/22/17   Dione BoozeGlick, David, MD  predniSONE (DELTASONE) 10 MG tablet Take 4 tablets (40 mg total) by mouth daily. 06/11/18   Tilden Fossaees, Elizabeth, MD  omeprazole (PRILOSEC) 20 MG capsule Take 20 mg by mouth daily as needed. For upset  stomach   10/20/11  [provider]  sucralfate (CARAFATE) 1 G tablet Take 1 tablet (1 g total) by mouth 4 (four) times daily. 06/08/11 10/20/11  Eber HongMiller, Brian, MD    Family History Family History  Problem Relation Age of Onset  . Cancer Other   . Hypertension Other     Social History Social History   Tobacco Use  . Smoking status: Current Every Day Smoker    Packs/day: 1.00    Types: Cigarettes  . Smokeless tobacco: Never Used  Substance Use Topics  . Alcohol use: Yes  . Drug use: Yes    Types: Marijuana     Allergies   Shellfish-derived products   Review of Systems Review of Systems  All other systems reviewed and are negative.    Physical Exam Updated Vital Signs BP 111/72 (BP Location: Right Arm)   Pulse 81   Temp 97.6 F (36.4 C) (Oral)   Resp 15   Ht 5\' 5"  (1.651 m)   Wt 63.5 kg   SpO2 100%   BMI 23.30 kg/m   Physical Exam  Constitutional: He is oriented to person, place, and time. He appears well-developed and well-nourished.  HENT:  Head: Normocephalic and atraumatic.  Eyes: Pupils are equal, round, and reactive to light. Conjunctivae and EOM are normal. Right eye exhibits no discharge.  Left eye exhibits no discharge. No scleral icterus.  Neck: Normal range of motion. Neck supple. No JVD present.  Cardiovascular: Normal rate, regular rhythm and normal heart sounds. Exam reveals no gallop and no friction rub.  No murmur heard. Pulmonary/Chest: Effort normal and breath sounds normal. No respiratory distress. He has no wheezes. He has no rales. He exhibits no tenderness.  Abdominal: Soft. He exhibits no distension and no mass. There is no tenderness. There is no rebound and no guarding.  Musculoskeletal: Normal range of motion. He exhibits no edema or tenderness.  Neurological: He is alert and oriented to person, place, and time.  Skin: Skin is warm and dry.  Psychiatric: He has a normal mood and affect. His behavior is normal. Judgment and  thought content normal.  Nursing note and vitals reviewed.    ED Treatments / Results  Labs (all labs ordered are listed, but only abnormal results are displayed) Labs Reviewed - No data to display  EKG None  Radiology Dg Chest 2 View  Result Date: 06/24/2018 CLINICAL DATA:  Left chest pain. Shortness of breath. Recent pneumonia. EXAM: CHEST - 2 VIEW COMPARISON:  06/11/2018 FINDINGS: The heart size and mediastinal contours are within normal limits. Previously seen left lower lung opacity in the region of the lingula is no longer visualized. Both lungs are now clear. The visualized skeletal structures are unremarkable. IMPRESSION: Interval resolution of lingular infiltrate. No active cardiopulmonary disease. Electronically Signed   By: Myles Rosenthal M.D.   On: 06/24/2018 03:08    Procedures Procedures (including critical care time)  Medications Ordered in ED Medications - No data to display   Initial Impression / Assessment and Plan / ED Course  I have reviewed the triage vital signs and the nursing notes.  Pertinent labs & imaging results that were available during my care of the patient were reviewed by me and considered in my medical decision making (see chart for details).     Patient with left rib pain.  Diagnosed with pneumonia recently.  Resolved on CXR.  Likely inflammatory etiology of pain, recommend rest and NSAIDs.    Patient reassured.  Final Clinical Impressions(s) / ED Diagnoses   Final diagnoses:  Anxiety attack  Rib pain    ED Discharge Orders    None       Roxy Horseman, PA-C 06/24/18 9629    Glynn Octave, MD 06/24/18 [phone]

## 2018-06-28 ENCOUNTER — Other Ambulatory Visit: Payer: Self-pay

## 2018-06-28 ENCOUNTER — Emergency Department (HOSPITAL_COMMUNITY)
Admission: EM | Admit: 2018-06-28 | Discharge: 2018-06-29 | Disposition: A | Discharge status: Home / Self Care | Payer: Medicare Other | Attending: Emergency Medicine | Admitting: Emergency Medicine

## 2018-06-28 ENCOUNTER — Encounter (HOSPITAL_COMMUNITY): Payer: Self-pay | Admitting: *Deleted

## 2018-06-28 DIAGNOSIS — F410 Panic disorder [episodic paroxysmal anxiety] without agoraphobia: Secondary | ICD-10-CM | POA: Diagnosis present

## 2018-06-28 DIAGNOSIS — F17210 Nicotine dependence, cigarettes, uncomplicated: Secondary | ICD-10-CM | POA: Insufficient documentation

## 2018-06-28 DIAGNOSIS — R457 State of emotional shock and stress, unspecified: Secondary | ICD-10-CM | POA: Diagnosis not present

## 2018-06-28 DIAGNOSIS — R0789 Other chest pain: Secondary | ICD-10-CM | POA: Diagnosis not present

## 2018-06-28 DIAGNOSIS — J45909 Unspecified asthma, uncomplicated: Secondary | ICD-10-CM | POA: Insufficient documentation

## 2018-06-28 DIAGNOSIS — R110 Nausea: Secondary | ICD-10-CM | POA: Diagnosis not present

## 2018-06-28 DIAGNOSIS — F419 Anxiety disorder, unspecified: Secondary | ICD-10-CM | POA: Diagnosis not present

## 2018-06-28 DIAGNOSIS — R000 Tachycardia, unspecified: Secondary | ICD-10-CM | POA: Diagnosis not present

## 2018-06-28 DIAGNOSIS — R079 Chest pain, unspecified: Secondary | ICD-10-CM | POA: Diagnosis not present

## 2018-06-28 NOTE — ED Triage Notes (Signed)
Pt reports recurring anxiety attacks that wakes him up from his sleep.  He was seen in the ED for same 2 days ago.  Reports seeing his PCP Orson AloeHenderson but he would not put him on anxiety medication.  Pt is appears calm but tense.

## 2018-06-29 DIAGNOSIS — F419 Anxiety disorder, unspecified: Secondary | ICD-10-CM | POA: Diagnosis not present

## 2018-06-29 MED ORDER — HYDROXYZINE HCL 25 MG PO TABS: 25.0000 mg | ORAL_TABLET | Freq: Four times a day (QID) | ORAL | 0 refills | Status: DC | PRN

## 2018-06-29 MED ORDER — HYDROXYZINE HCL 25 MG PO TABS
25.0000 mg | ORAL_TABLET | Freq: Once | ORAL | Status: AC
  Administered 2018-06-29: 25 mg via ORAL
  Filled 2018-06-29: qty 1

## 2018-06-29 NOTE — ED Provider Notes (Signed)
[name] EMERGENCY DEPARTMENT Provider Note   CSN: [id number] Arrival date & time: 06/28/18  2220     History   Chief Complaint Chief Complaint  Patient presents with  . Panic Attack    HPI Kenneth Lane is a 32 y.o. male.  HPI  This is a 32 year old male with a history of anxiety and asthma who presents with panic attack.  Patient reports that he has had ongoing issues with panic and anxiety over the last year.  He reports that his seen his primary physician and a psychologist but "they will not give me anything."  He states that often his anxiety wakes him up in the middle of the night.  Today he denies any physical complaints including chest pain, shortness of breath, abdominal pain, nausea, vomiting.  He at times states that he has some chest discomfort with these anxiety symptoms.  He has not taken anything for his symptoms.  He was seen and evaluated 5 days ago for chest pain and anxiety.  Work-up was negative at that time.  Patient denies any SI or HI.  Denies any alcohol or drug use.  Past Medical History:  Diagnosis Date  . Anxiety   . Asthma   . Bradycardia   . Seasonal allergies     There are no active problems to display for this patient.   Past Surgical History:  Procedure Laterality Date  . DENTAL SURGERY          Home Medications    Prior to Admission medications   Medication Sig Start Date End Date Taking? Authorizing Provider  hydrOXYzine (ATARAX/VISTARIL) 25 MG tablet Take 1 tablet (25 mg total) by mouth every 6 (six) hours as needed for anxiety. 06/29/18   Arden Tinoco, Mayer Masker, MD  ibuprofen (ADVIL,MOTRIN) 600 MG tablet Take 1 tablet (600 mg total) by mouth every 6 (six) hours as needed. Patient not taking: Reported on 06/29/2018 02/18/18   Eber Hong, MD  LORazepam (ATIVAN) 1 MG tablet Take 1 tablet (1 mg total) by mouth 3 (three) times daily as needed for anxiety. Patient not taking: Reported on 06/29/2018 01/22/17   Dione Booze, MD  predniSONE (DELTASONE) 10 MG tablet Take 4 tablets (40 mg total) by mouth daily. Patient not taking: Reported on 06/29/2018 06/11/18   Tilden Fossa, MD  omeprazole (PRILOSEC) 20 MG capsule Take 20 mg by mouth daily as needed. For upset stomach   10/20/11  [provider]  sucralfate (CARAFATE) 1 G tablet Take 1 tablet (1 g total) by mouth 4 (four) times daily. 06/08/11 10/20/11  Eber Hong, MD    Family History Family History  Problem Relation Age of Onset  . Cancer Other   . Hypertension Other     Social History Social History   Tobacco Use  . Smoking status: Current Every Day Smoker    Packs/day: 1.00    Types: Cigarettes  . Smokeless tobacco: Never Used  Substance Use Topics  . Alcohol use: Yes  . Drug use: Yes    Types: Marijuana     Allergies   Shellfish-derived products   Review of Systems Review of Systems  Constitutional: Negative for fever.  Respiratory: Negative for shortness of breath.   Cardiovascular: Negative for chest pain.  Gastrointestinal: Negative for nausea and vomiting.  Psychiatric/Behavioral: Negative for suicidal ideas. The patient is nervous/anxious.   All other systems reviewed and are negative.    Physical Exam Updated Vital Signs BP 94/60   Pulse  65   Temp 98 F (36.7 C) (Oral)   Resp 18   SpO2 96%   Physical Exam  Constitutional: He is oriented to person, place, and time. He appears well-developed and well-nourished. No distress.  HENT:  Head: Normocephalic and atraumatic.  Eyes: Pupils are equal, round, and reactive to light.  Cardiovascular: Normal rate, regular rhythm and normal heart sounds.  No murmur heard. Pulmonary/Chest: Effort normal and breath sounds normal. No respiratory distress. He has no wheezes.  Abdominal: Soft. There is no tenderness.  Musculoskeletal: He exhibits no edema.  Neurological: He is alert and oriented to person, place, and time.  Skin: Skin is warm and dry.  Psychiatric:  He has a normal mood and affect.  Nursing note and vitals reviewed.    ED Treatments / Results  Labs (all labs ordered are listed, but only abnormal results are displayed) Labs Reviewed - No data to display  EKG None  Radiology No results found.  Procedures Procedures (including critical care time)  Medications Ordered in ED Medications  hydrOXYzine (ATARAX/VISTARIL) tablet 25 mg (has no administration in time range)     Initial Impression / Assessment and Plan / ED Course  I have reviewed the triage vital signs and the nursing notes.  Pertinent labs & imaging results that were available during my care of the patient were reviewed by me and considered in my medical decision making (see chart for details).     Patient presents with concerns for recurrent panic attack.  He is overall nontoxic-appearing and vital signs are reassuring.  Patient does not appear anxious or tense at this time.  He does report some improvement of his symptoms.  Denies any physical complaints or suicidal ideation.  I reviewed his prior work-ups.  I discussed with the patient that he may benefit from a daily medication such as an SSRI; however, this is best prescribed by psychologist, psychiatrist, or primary physician.  Will refer him back to his primary physician for this purpose.  Patient was given Vistaril.  Patient was given a short course of Vistaril as needed for anxiety.  After history, exam, and medical workup I feel the patient has been appropriately medically screened and is safe for discharge home. Pertinent diagnoses were discussed with the patient. Patient was given return precautions.   Final Clinical Impressions(s) / ED Diagnoses   Final diagnoses:  Anxiety    ED Discharge Orders         Ordered    hydrOXYzine (ATARAX/VISTARIL) 25 MG tablet  Every 6 hours PRN     06/29/18 0214           Shon BatonHorton, Rosellen Lichtenberger F, MD 06/29/18 [phone]

## 2018-06-29 NOTE — Discharge Instructions (Addendum)
You were seen today for anxiety and panic.  You have previously been seen with a negative work-up.  It is important that you see your primary physician and may need to see a psychiatrist or psychologist.  You will be given Vistaril as needed.  You may benefit from a daily medication but this needs to be prescribed by her primary physician or a mental health professional.  If you develop thoughts of wanting to hurt yourself or anyone else or worsening symptoms you should be reevaluated.

## 2018-07-02 ENCOUNTER — Encounter (HOSPITAL_COMMUNITY): Payer: Self-pay | Admitting: Emergency Medicine

## 2018-07-02 ENCOUNTER — Other Ambulatory Visit: Payer: Self-pay

## 2018-07-02 ENCOUNTER — Emergency Department (HOSPITAL_COMMUNITY)
Admission: EM | Admit: 2018-07-02 | Discharge: 2018-07-02 | Disposition: A | Discharge status: Home / Self Care | Payer: Medicare Other | Attending: Emergency Medicine | Admitting: Emergency Medicine

## 2018-07-02 DIAGNOSIS — R300 Dysuria: Secondary | ICD-10-CM | POA: Diagnosis not present

## 2018-07-02 DIAGNOSIS — Z79899 Other long term (current) drug therapy: Secondary | ICD-10-CM | POA: Insufficient documentation

## 2018-07-02 DIAGNOSIS — F17210 Nicotine dependence, cigarettes, uncomplicated: Secondary | ICD-10-CM | POA: Diagnosis not present

## 2018-07-02 DIAGNOSIS — Z711 Person with feared health complaint in whom no diagnosis is made: Secondary | ICD-10-CM

## 2018-07-02 DIAGNOSIS — N4889 Other specified disorders of penis: Secondary | ICD-10-CM | POA: Diagnosis not present

## 2018-07-02 DIAGNOSIS — Z202 Contact with and (suspected) exposure to infections with a predominantly sexual mode of transmission: Secondary | ICD-10-CM | POA: Diagnosis not present

## 2018-07-02 LAB — URINALYSIS, ROUTINE W REFLEX MICROSCOPIC
Bacteria, UA: NONE SEEN
Bilirubin Urine: NEGATIVE
Glucose, UA: NEGATIVE mg/dL
Hgb urine dipstick: NEGATIVE
Ketones, ur: NEGATIVE mg/dL
Leukocytes, UA: NEGATIVE
Nitrite: NEGATIVE
Protein, ur: NEGATIVE mg/dL
Specific Gravity, Urine: 1.021 (ref 1.005–1.030)
pH: 8 (ref 5.0–8.0)

## 2018-07-02 MED ORDER — CEFTRIAXONE SODIUM 250 MG IJ SOLR
250.0000 mg | Freq: Once | INTRAMUSCULAR | Status: AC
  Administered 2018-07-02: 250 mg via INTRAMUSCULAR
  Filled 2018-07-02: qty 250

## 2018-07-02 MED ORDER — AZITHROMYCIN 250 MG PO TABS
1000.0000 mg | ORAL_TABLET | Freq: Once | ORAL | Status: AC
  Administered 2018-07-02: 1000 mg via ORAL
  Filled 2018-07-02: qty 4

## 2018-07-02 MED ORDER — LIDOCAINE HCL (PF) 1 % IJ SOLN
INTRAMUSCULAR | Status: AC
  Administered 2018-07-02: 0.9 mL
  Filled 2018-07-02: qty 5

## 2018-07-02 NOTE — ED Notes (Signed)
Patient verbalizes understanding of discharge instructions. Opportunity for questioning and answers were provided. Armband removed by staff, pt discharged from ED ambulatory.   

## 2018-07-02 NOTE — ED Provider Notes (Signed)
[name] EMERGENCY DEPARTMENT Provider Note   CSN: [id number] Arrival date & time: 07/02/18  1028     History   Chief Complaint Chief Complaint  Patient presents with  . SEXUALLY TRANSMITTED DISEASE    HPI Kenneth Lane is a 32 y.o. male presenting for evaluation of acute onset, persistent dysuria for 2 days.  He also notes urinary frequency but denies hematuria.  Also notes some swelling to the glans penis.  He denies urethral discharge, testicular pain or scrotal swelling, abdominal pain, nausea, vomiting, fevers, or pain with bowel movements.  He is sexually active with one male partner in a monogamous relationship but does not use protection.  The history is provided by the patient.    Past Medical History:  Diagnosis Date  . Anxiety   . Asthma   . Bradycardia   . Seasonal allergies     There are no active problems to display for this patient.   Past Surgical History:  Procedure Laterality Date  . DENTAL SURGERY          Home Medications    Prior to Admission medications   Medication Sig Start Date End Date Taking? Authorizing Provider  hydrOXYzine (ATARAX/VISTARIL) 25 MG tablet Take 1 tablet (25 mg total) by mouth every 6 (six) hours as needed for anxiety. 06/29/18   Horton, Mayer Maskerourtney F, MD  ibuprofen (ADVIL,MOTRIN) 600 MG tablet Take 1 tablet (600 mg total) by mouth every 6 (six) hours as needed. Patient not taking: Reported on 06/29/2018 02/18/18   Eber HongMiller, Brian, MD  LORazepam (ATIVAN) 1 MG tablet Take 1 tablet (1 mg total) by mouth 3 (three) times daily as needed for anxiety. Patient not taking: Reported on 06/29/2018 01/22/17   Dione BoozeGlick, David, MD  predniSONE (DELTASONE) 10 MG tablet Take 4 tablets (40 mg total) by mouth daily. Patient not taking: Reported on 06/29/2018 06/11/18   Tilden Fossaees, Elizabeth, MD  omeprazole (PRILOSEC) 20 MG capsule Take 20 mg by mouth daily as needed. For upset stomach   10/20/11  [provider]  sucralfate  (CARAFATE) 1 G tablet Take 1 tablet (1 g total) by mouth 4 (four) times daily. 06/08/11 10/20/11  Eber HongMiller, Brian, MD    Family History Family History  Problem Relation Age of Onset  . Cancer Other   . Hypertension Other     Social History Social History   Tobacco Use  . Smoking status: Current Every Day Smoker    Packs/day: 1.00    Types: Cigarettes  . Smokeless tobacco: Never Used  Substance Use Topics  . Alcohol use: Yes  . Drug use: Yes    Types: Marijuana     Allergies   Shellfish-derived products   Review of Systems Review of Systems  Constitutional: Negative for chills and fever.  Respiratory: Negative for shortness of breath.   Cardiovascular: Negative for chest pain.  Gastrointestinal: Negative for abdominal pain, nausea and vomiting.  Genitourinary: Positive for dysuria, frequency and penile swelling. Negative for decreased urine volume, discharge, genital sores, penile pain and urgency.  All other systems reviewed and are negative.    Physical Exam Updated Vital Signs BP 129/85 (BP Location: Right Arm)   Pulse 73   Temp 98.4 F (36.9 C) (Oral)   Resp 18   Ht 5\' 5"  (1.651 m)   Wt 62.1 kg   SpO2 100%   BMI 22.80 kg/m   Physical Exam  Constitutional: He appears well-developed and well-nourished. No distress.  HENT:  Head: Normocephalic  and atraumatic.  Eyes: Conjunctivae are normal. Right eye exhibits no discharge. Left eye exhibits no discharge.  Neck: No JVD present. No tracheal deviation present.  Cardiovascular: Normal rate, regular rhythm and normal heart sounds.  Pulmonary/Chest: Effort normal and breath sounds normal.  Abdominal: Soft. Bowel sounds are normal. He exhibits no distension. There is no tenderness. There is no rigidity, no guarding and no CVA tenderness.  Genitourinary:  Genitourinary Comments: Examination performed in the presence of a chaperone.  No masses or lesions to the genitalia.  No testicular pain or swelling, no erythema  or abnormal drainage.  No penile tenderness.  No hernia noted.   Musculoskeletal: He exhibits no edema.  Neurological: He is alert.  Skin: Skin is warm and dry. No erythema.  Psychiatric: He has a normal mood and affect. His behavior is normal.  Nursing note and vitals reviewed.    ED Treatments / Results  Labs (all labs ordered are listed, but only abnormal results are displayed) Labs Reviewed  URINALYSIS, ROUTINE W REFLEX MICROSCOPIC - Abnormal; Notable for the following components:      Result Value   APPearance TURBID (*)    All other components within normal limits  RPR  HIV ANTIBODY (ROUTINE TESTING W REFLEX)  GC/CHLAMYDIA PROBE AMP ([place]) NOT AT [facility]    EKG None  Radiology No results found.  Procedures Procedures (including critical care time)  Medications Ordered in ED Medications  azithromycin (ZITHROMAX) tablet 1,000 mg (has no administration in time range)  cefTRIAXone (ROCEPHIN) injection 250 mg (has no administration in time range)     Initial Impression / Assessment and Plan / ED Course  I have reviewed the triage vital signs and the nursing notes.  Pertinent labs & imaging results that were available during my care of the patient were reviewed by me and considered in my medical decision making (see chart for details).     Patient presenting for evaluation of dysuria.  He is afebrile, vital signs are stable. No abdominal tenderness, abdominal pain or painful bowel movements to indicate prostatitis.  No tenderness to palpation of the testes or epididymis to suggest orchitis or epididymitis.  UA does not suggest UTI or nephrolithiasis or trichomonas.  STD cultures obtained including HIV, syphilis, gonorrhea and chlamydia. Patient to be discharged with instructions to follow up with PCP. Discussed importance of using protection when sexually active. Pt understands that he has GC/Chlamydia cultures pending and that they will need to inform all sexual  partners if results return positive. Patient has been treated prophylactically with azithromycin and Rocephin.  Discussed strict ED return precautions. Pt verbalized understanding of and agreement with plan and is safe for discharge home at this time.   Final Clinical Impressions(s) / ED Diagnoses   Final diagnoses:  Concern about STD in male without diagnosis    ED Discharge Orders    None       Bennye Alm 07/02/18 1124    Cathren Laine, MD 07/02/18 1334

## 2018-07-02 NOTE — ED Triage Notes (Signed)
Pt. Stated, Kenneth Lane had some irritation down there. (penis) started 2 days ago.

## 2018-07-02 NOTE — Discharge Instructions (Signed)
Follow up with [facility] STD clinic to be screened for HIV in the future and for future STD concerns or screenings. This is the recommendation by the CDC for people with multiple sexual partners or hx of STDs. You have been treated for gonorrhea and chlamydia in the ER but the hospital will call you if lab is positive.  Do not have sexual intercourse for 7 days after antibiotic treatment. ° °

## 2018-07-03 LAB — RPR: RPR Ser Ql: NONREACTIVE

## 2018-07-03 LAB — HIV ANTIBODY (ROUTINE TESTING W REFLEX): HIV Screen 4th Generation wRfx: NONREACTIVE

## 2018-07-04 LAB — GC/CHLAMYDIA PROBE AMP (~~LOC~~) NOT AT ARMC
Chlamydia: NEGATIVE
Neisseria Gonorrhea: NEGATIVE

## 2018-09-22 ENCOUNTER — Emergency Department (HOSPITAL_COMMUNITY)
Admission: EM | Admit: 2018-09-22 | Discharge: 2018-09-22 | Disposition: A | Discharge status: Home / Self Care | Payer: Medicare Other | Attending: Emergency Medicine | Admitting: Emergency Medicine

## 2018-09-22 ENCOUNTER — Encounter (HOSPITAL_COMMUNITY): Payer: Self-pay | Admitting: Emergency Medicine

## 2018-09-22 DIAGNOSIS — F17210 Nicotine dependence, cigarettes, uncomplicated: Secondary | ICD-10-CM | POA: Insufficient documentation

## 2018-09-22 DIAGNOSIS — R0789 Other chest pain: Secondary | ICD-10-CM | POA: Diagnosis not present

## 2018-09-22 DIAGNOSIS — J069 Acute upper respiratory infection, unspecified: Secondary | ICD-10-CM | POA: Diagnosis not present

## 2018-09-22 DIAGNOSIS — F1290 Cannabis use, unspecified, uncomplicated: Secondary | ICD-10-CM | POA: Insufficient documentation

## 2018-09-22 DIAGNOSIS — R05 Cough: Secondary | ICD-10-CM | POA: Diagnosis not present

## 2018-09-22 DIAGNOSIS — Z79899 Other long term (current) drug therapy: Secondary | ICD-10-CM | POA: Insufficient documentation

## 2018-09-22 DIAGNOSIS — B9789 Other viral agents as the cause of diseases classified elsewhere: Secondary | ICD-10-CM

## 2018-09-22 MED ORDER — METHOCARBAMOL 500 MG PO TABS: 500.0000 mg | ORAL_TABLET | Freq: Two times a day (BID) | ORAL | 0 refills | Status: DC

## 2018-09-22 NOTE — ED Triage Notes (Signed)
Pt states he has had a cough for several days, coughing up yellow sputum. Denies fever or nasal congestion. Pt also complains of pain in his right pectoral muscle. 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 Pt states it worsens with movement/ coughing and touching it.

## 2018-09-22 NOTE — ED Notes (Signed)
Declined W/C at D/C and was escorted to lobby by RN. 

## 2018-09-22 NOTE — Discharge Instructions (Signed)
Please read attached information. If you experience any new or worsening signs or symptoms please return to the emergency room for evaluation. Please follow-up with your primary care provider or specialist as discussed. Please use medication prescribed only as directed and discontinue taking if you have any concerning signs or symptoms.   °

## 2018-09-22 NOTE — ED Provider Notes (Signed)
[name] EMERGENCY DEPARTMENT Provider Note   CSN: [id number] Arrival date & time: 09/22/18  8333    History   Chief Complaint Chief Complaint  Patient presents with  . Cough    HPI Kenneth Lane is a 33 y.o. male.     HPI   33 year old male presents today with complaints of cough.  Patient notes a 2-day history of productive cough.  He denies any rhinorrhea nasal congestion or fever.  He notes intermittent shortness of breath, none presently.  He notes a history of smoking and asthma.  He reports he does have albuterol at home but has not had to use it and does not like using it as it causes anxiety.  He reports tenderness along his right pectoralis muscle, he notes that he lifted a heavy bed upstairs yesterday and believes that what caused this.  He denies any lower extremity swelling or edema, denies any history DVT PE or any other significant risk factors.  No medications prior to arrival.    Past Medical History:  Diagnosis Date  . Anxiety   . Asthma   . Bradycardia   . Seasonal allergies     There are no active problems to display for this patient.   Past Surgical History:  Procedure Laterality Date  . DENTAL SURGERY          Home Medications    Prior to Admission medications   Medication Sig Start Date End Date Taking? Authorizing Provider  hydrOXYzine (ATARAX/VISTARIL) 25 MG tablet Take 1 tablet (25 mg total) by mouth every 6 (six) hours as needed for anxiety. 06/29/18   Horton, Mayer Masker, MD  ibuprofen (ADVIL,MOTRIN) 600 MG tablet Take 1 tablet (600 mg total) by mouth every 6 (six) hours as needed. Patient not taking: Reported on 06/29/2018 02/18/18   Eber Hong, MD  LORazepam (ATIVAN) 1 MG tablet Take 1 tablet (1 mg total) by mouth 3 (three) times daily as needed for anxiety. Patient not taking: Reported on 06/29/2018 01/22/17   Dione Booze, MD  methocarbamol (ROBAXIN) 500 MG tablet Take 1 tablet (500 mg total) by mouth 2 (two)  times daily. 09/22/18   Dianne Bady, Tinnie Gens, PA-C  predniSONE (DELTASONE) 10 MG tablet Take 4 tablets (40 mg total) by mouth daily. Patient not taking: Reported on 06/29/2018 06/11/18   Tilden Fossa, MD  omeprazole (PRILOSEC) 20 MG capsule Take 20 mg by mouth daily as needed. For upset stomach   10/20/11  [provider]  sucralfate (CARAFATE) 1 G tablet Take 1 tablet (1 g total) by mouth 4 (four) times daily. 06/08/11 10/20/11  Eber Hong, MD    Family History Family History  Problem Relation Age of Onset  . Cancer Other   . Hypertension Other     Social History Social History   Tobacco Use  . Smoking status: Current Every Day Smoker    Packs/day: 1.00    Types: Cigarettes  . Smokeless tobacco: Never Used  Substance Use Topics  . Alcohol use: Yes  . Drug use: Yes    Types: Marijuana     Allergies   Shellfish-derived products   Review of Systems Review of Systems  All other systems reviewed and are negative.    Physical Exam Updated Vital Signs BP 122/77 (BP Location: Right Arm)   Pulse 68   Temp 98.3 F (36.8 C) (Oral)   Resp 15   Ht 5\' 5"  (1.651 m)   Wt 60.8 kg   SpO2 98%  BMI 22.30 kg/m   Physical Exam Vitals signs and nursing note reviewed.  Constitutional:      Appearance: He is well-developed.  HENT:     Head: Normocephalic and atraumatic.     Comments: Oropharynx clear Eyes:     General: No scleral icterus.       Right eye: No discharge.        Left eye: No discharge.     Conjunctiva/sclera: Conjunctivae normal.     Pupils: Pupils are equal, round, and reactive to light.  Neck:     Musculoskeletal: Normal range of motion.     Vascular: No JVD.     Trachea: No tracheal deviation.  Pulmonary:     Effort: Pulmonary effort is normal. No respiratory distress.     Breath sounds: Normal breath sounds. No stridor. No wheezing, rhonchi or rales.     Comments: TTP of right anterior chest wall- no rash or trauma Chest:     Chest wall:  Tenderness present.  Neurological:     Mental Status: He is alert and oriented to person, place, and time.     Coordination: Coordination normal.  Psychiatric:        Behavior: Behavior normal.        Thought Content: Thought content normal.        Judgment: Judgment normal.      ED Treatments / Results  Labs (all labs ordered are listed, but only abnormal results are displayed) Labs Reviewed - No data to display  EKG None  Radiology No results found.  Procedures Procedures (including critical care time)  Medications Ordered in ED Medications - No data to display   Initial Impression / Assessment and Plan / ED Course  I have reviewed the triage vital signs and the nursing notes.  Pertinent labs & imaging results that were available during my care of the patient were reviewed by me and considered in my medical decision making (see chart for details).        Labs:    Imaging:  Consults:  Therapeutics:  Discharge Meds:   Assessment/Plan:  32 YOM presents today with likely viral uri. No respiratory complications. Well appearing. Pt is discharged with symptomatic care and strict return precautions.  Verbalized understanding and agreement to today's plan.   Final Clinical Impressions(s) / ED Diagnoses   Final diagnoses:  Viral URI with cough    ED Discharge Orders         Ordered    methocarbamol (ROBAXIN) 500 MG tablet  2 times daily     09/22/18 0903           Eyvonne Mechanic, PA-C 09/22/18 3419    Raeford Razor, MD 09/23/18 1006

## 2018-09-26 ENCOUNTER — Emergency Department (HOSPITAL_COMMUNITY)
Admission: EM | Admit: 2018-09-26 | Discharge: 2018-09-27 | Disposition: A | Discharge status: Home / Self Care | Payer: Medicare Other | Attending: Emergency Medicine | Admitting: Emergency Medicine

## 2018-09-26 DIAGNOSIS — F419 Anxiety disorder, unspecified: Secondary | ICD-10-CM | POA: Diagnosis not present

## 2018-09-26 DIAGNOSIS — Z5321 Procedure and treatment not carried out due to patient leaving prior to being seen by health care provider: Secondary | ICD-10-CM | POA: Diagnosis not present

## 2018-09-26 DIAGNOSIS — R457 State of emotional shock and stress, unspecified: Secondary | ICD-10-CM | POA: Diagnosis not present

## 2018-09-26 NOTE — ED Triage Notes (Signed)
Pt here by EMS for feeling anxious over the last day.  No pain just feeling anxious. Recently prescribed hydroxyzine but states it makes him sleep so not taking any.  A&Ox4.

## 2018-09-27 ENCOUNTER — Encounter (HOSPITAL_COMMUNITY): Payer: Self-pay

## 2018-09-27 NOTE — ED Notes (Signed)
No answer for room X3 

## 2018-09-30 ENCOUNTER — Ambulatory Visit (HOSPITAL_COMMUNITY)
Admission: EM | Admit: 2018-09-30 | Discharge: 2018-09-30 | Disposition: A | Discharge status: Home / Self Care | Payer: Medicare Other | Attending: Family Medicine | Admitting: Family Medicine

## 2018-09-30 ENCOUNTER — Other Ambulatory Visit: Payer: Self-pay

## 2018-09-30 ENCOUNTER — Encounter (HOSPITAL_COMMUNITY): Payer: Self-pay | Admitting: Emergency Medicine

## 2018-09-30 DIAGNOSIS — K219 Gastro-esophageal reflux disease without esophagitis: Secondary | ICD-10-CM

## 2018-09-30 MED ORDER — OMEPRAZOLE 20 MG PO CPDR: 20.0000 mg | DELAYED_RELEASE_CAPSULE | Freq: Every day | ORAL | 0 refills | Status: DC

## 2018-09-30 NOTE — ED Triage Notes (Signed)
The patient presented to the [facility] with a complaint of reflux. Patient reported a hx of reflux and reported that he just ate some pizza.

## 2018-10-01 NOTE — ED Provider Notes (Signed)
[facility]    CSN: [id number] Arrival date & time: 09/30/18  2000     History   Chief Complaint Chief Complaint  Patient presents with  . Gastroesophageal Reflux    HPI Kenneth Lane is a 33 y.o. male.   Kenneth Lane presents with complaints of heartburn and sensation of regurgitation through his throat after eating pizza tonight. No chest pain . No shortness of breath . No arm or jaw pain. Has had before and has taken medications which helped. Hasn't taken anything for this tonight. Has not vomited. No fevers. No abdominal pain. History of anxiety and has had similar sensation with his anxiety as well. No cardiac history. No headache. No fevers. Hx of anxiety, asthma, bradycardia.     ROS per HPI, negative if not otherwise mentioned.      Past Medical History:  Diagnosis Date  . Anxiety   . Asthma   . Bradycardia   . Seasonal allergies     There are no active problems to display for this patient.   Past Surgical History:  Procedure Laterality Date  . DENTAL SURGERY         Home Medications    Prior to Admission medications   Medication Sig Start Date End Date Taking? Authorizing Provider  hydrOXYzine (ATARAX/VISTARIL) 25 MG tablet Take 1 tablet (25 mg total) by mouth every 6 (six) hours as needed for anxiety. 06/29/18   Horton, Mayer Masker, MD  ibuprofen (ADVIL,MOTRIN) 600 MG tablet Take 1 tablet (600 mg total) by mouth every 6 (six) hours as needed. Patient not taking: Reported on 06/29/2018 02/18/18   Eber Hong, MD  LORazepam (ATIVAN) 1 MG tablet Take 1 tablet (1 mg total) by mouth 3 (three) times daily as needed for anxiety. Patient not taking: Reported on 06/29/2018 01/22/17   Dione Booze, MD  methocarbamol (ROBAXIN) 500 MG tablet Take 1 tablet (500 mg total) by mouth 2 (two) times daily. 09/22/18   Hedges, Tinnie Gens, PA-C  omeprazole (PRILOSEC) 20 MG capsule Take 1 capsule (20 mg total) by mouth daily. 09/30/18   Georgetta Haber, NP  predniSONE  (DELTASONE) 10 MG tablet Take 4 tablets (40 mg total) by mouth daily. Patient not taking: Reported on 06/29/2018 06/11/18   Tilden Fossa, MD  sucralfate (CARAFATE) 1 G tablet Take 1 tablet (1 g total) by mouth 4 (four) times daily. 06/08/11 10/20/11  Eber Hong, MD    Family History Family History  Problem Relation Age of Onset  . Cancer Other   . Hypertension Other     Social History Social History   Tobacco Use  . Smoking status: Current Every Day Smoker    Packs/day: 1.00    Types: Cigarettes  . Smokeless tobacco: Never Used  Substance Use Topics  . Alcohol use: Yes  . Drug use: Yes    Types: Marijuana     Allergies   Shellfish-derived products   Review of Systems Review of Systems   Physical Exam Triage Vital Signs ED Triage Vitals  Enc Vitals Group     BP 09/30/18 2009 134/70     Pulse Rate 09/30/18 2009 (!) 57     Resp 09/30/18 2009 18     Temp 09/30/18 2009 98.6 F (37 C)     Temp Source 09/30/18 2009 Temporal     SpO2 09/30/18 2009 100 %     Weight --      Height --      Head Circumference --  Peak Flow --      Pain Score 09/30/18 2008 0     Pain Loc --      Pain Edu? --      Excl. in GC? --    No data found.  Updated Vital Signs BP 134/70 (BP Location: Right Arm)   Pulse (!) 57   Temp 98.6 F (37 C) (Temporal)   Resp 18   SpO2 100%    Physical Exam Constitutional:      Appearance: He is well-developed.  HENT:     Head: Normocephalic and atraumatic.  Cardiovascular:     Rate and Rhythm: Normal rate and regular rhythm.  Pulmonary:     Effort: Pulmonary effort is normal.     Breath sounds: Normal breath sounds.  Abdominal:     Palpations: Abdomen is soft.     Tenderness: There is no abdominal tenderness.  Skin:    General: Skin is warm and dry.  Neurological:     Mental Status: He is alert and oriented to person, place, and time.      UC Treatments / Results  Labs (all labs ordered are listed, but only abnormal  results are displayed) Labs Reviewed - No data to display  EKG None  Radiology No results found.  Procedures Procedures (including critical care time)  Medications Ordered in UC Medications - No data to display  Initial Impression / Assessment and Plan / UC Course  I have reviewed the triage vital signs and the nursing notes.  Pertinent labs & imaging results that were available during my care of the patient were reviewed by me and considered in my medical decision making (see chart for details).     No cardiac risk factors. Non toxic. History of similar. Hasn't taken any medications for treatment, had eaten triggering food immediately prior to onset. Script for omeprazole provided. Return precautions provided. If symptoms worsen or do not improve in the next week to return to be seen or to follow up with PCP.  Patient verbalized understanding and agreeable to plan.   Final Clinical Impressions(s) / UC Diagnoses   Final diagnoses:  Gastroesophageal reflux disease, esophagitis presence not specified   Discharge Instructions   None    ED Prescriptions    Medication Sig Dispense Auth. Provider   omeprazole (PRILOSEC) 20 MG capsule Take 1 capsule (20 mg total) by mouth daily. 30 capsule Georgetta Haber, NP     Controlled Substance Prescriptions [place] Controlled Substance Registry consulted? Not Applicable   Georgetta Haber, NP 10/01/18 1013

## 2018-10-08 ENCOUNTER — Encounter (HOSPITAL_COMMUNITY): Payer: Self-pay | Admitting: Emergency Medicine

## 2018-10-08 ENCOUNTER — Emergency Department (HOSPITAL_COMMUNITY): Payer: Medicare Other

## 2018-10-08 ENCOUNTER — Emergency Department (HOSPITAL_COMMUNITY)
Admission: EM | Admit: 2018-10-08 | Discharge: 2018-10-08 | Disposition: A | Discharge status: Home / Self Care | Payer: Medicare Other | Attending: Emergency Medicine | Admitting: Emergency Medicine

## 2018-10-08 ENCOUNTER — Other Ambulatory Visit: Payer: Self-pay

## 2018-10-08 DIAGNOSIS — J069 Acute upper respiratory infection, unspecified: Secondary | ICD-10-CM | POA: Diagnosis not present

## 2018-10-08 DIAGNOSIS — R05 Cough: Secondary | ICD-10-CM | POA: Diagnosis not present

## 2018-10-08 DIAGNOSIS — R9431 Abnormal electrocardiogram [ECG] [EKG]: Secondary | ICD-10-CM | POA: Diagnosis not present

## 2018-10-08 DIAGNOSIS — F17210 Nicotine dependence, cigarettes, uncomplicated: Secondary | ICD-10-CM | POA: Insufficient documentation

## 2018-10-08 DIAGNOSIS — J45909 Unspecified asthma, uncomplicated: Secondary | ICD-10-CM | POA: Insufficient documentation

## 2018-10-08 DIAGNOSIS — B9789 Other viral agents as the cause of diseases classified elsewhere: Secondary | ICD-10-CM | POA: Diagnosis not present

## 2018-10-08 DIAGNOSIS — Z79899 Other long term (current) drug therapy: Secondary | ICD-10-CM | POA: Insufficient documentation

## 2018-10-08 DIAGNOSIS — R0789 Other chest pain: Secondary | ICD-10-CM | POA: Insufficient documentation

## 2018-10-08 LAB — BASIC METABOLIC PANEL
Anion gap: 8 (ref 5–15)
BUN: 13 mg/dL (ref 6–20)
CO2: 21 mmol/L — ABNORMAL LOW (ref 22–32)
Calcium: 9.4 mg/dL (ref 8.9–10.3)
Chloride: 108 mmol/L (ref 98–111)
Creatinine, Ser: 0.95 mg/dL (ref 0.61–1.24)
GFR calc Af Amer: >60 mL/min (ref >60)
Glucose, Bld: 124 mg/dL — ABNORMAL HIGH (ref 70–99)
Potassium: 3.4 mmol/L — ABNORMAL LOW (ref 3.5–5.1)
Sodium: 137 mmol/L (ref 135–145)

## 2018-10-08 LAB — I-STAT TROPONIN, ED: Troponin i, poc: 0 ng/mL (ref 0.00–0.08)

## 2018-10-08 LAB — CBC WITH DIFFERENTIAL/PLATELET
Abs Immature Granulocytes: 0.05 10*3/uL (ref 0.00–0.07)
BASOS ABS: 0 10*3/uL (ref 0.0–0.1)
Basophils Relative: 0 %
Eosinophils Absolute: 0.3 10*3/uL (ref 0.0–0.5)
Eosinophils Relative: 4 %
HCT: 45.3 % (ref 39.0–52.0)
Hemoglobin: 15.1 g/dL (ref 13.0–17.0)
Immature Granulocytes: 1 %
LYMPHS PCT: 18 %
Lymphs Abs: 1.3 10*3/uL (ref 0.7–4.0)
MCH: 28 pg (ref 26.0–34.0)
MCHC: 33.3 g/dL (ref 30.0–36.0)
MCV: 84 fL (ref 80.0–100.0)
Monocytes Absolute: 0.7 10*3/uL (ref 0.1–1.0)
Monocytes Relative: 10 %
Neutro Abs: 4.7 10*3/uL (ref 1.7–7.7)
Neutrophils Relative %: 67 %
Platelets: 207 10*3/uL (ref 150–400)
RBC: 5.39 MIL/uL (ref 4.22–5.81)
RDW: 13.5 % (ref 11.5–15.5)
WBC: 7.1 10*3/uL (ref 4.0–10.5)
nRBC: 0 % (ref 0.0–0.2)

## 2018-10-08 LAB — INFLUENZA PANEL BY PCR (TYPE A & B)
INFLAPCR: NEGATIVE
Influenza B By PCR: NEGATIVE

## 2018-10-08 MED ORDER — IBUPROFEN 800 MG PO TABS
800.0000 mg | ORAL_TABLET | Freq: Once | ORAL | Status: AC
  Administered 2018-10-08: 800 mg via ORAL
  Filled 2018-10-08: qty 1

## 2018-10-08 MED ORDER — BENZONATATE 100 MG PO CAPS: 100.0000 mg | ORAL_CAPSULE | Freq: Three times a day (TID) | ORAL | 0 refills | Status: DC

## 2018-10-08 NOTE — Discharge Instructions (Addendum)
You have been seen today for cough and chest pain. Please read and follow all provided instructions.   1. Medications: tessalon for cough, ibuprofen/tylenol for body aches and chest pain, usual home medications 2. Treatment: rest, drink plenty of fluids, your flu test is negative 3. Follow Up: Please follow up with your primary doctor in 2-5 days for discussion of your diagnoses and further evaluation after today's visit; if you do not have a primary care doctor use the resource guide provided to find one; Please return to the ER for any new or worsening symptoms. Please obtain all of your results from medical records or have your doctors office obtain the results - share them with your doctor - you should be seen at your doctors office. Call today to arrange your follow up.   Take medications as prescribed. Please review all of the medicines and only take them if you do not have an allergy to them. Return to the emergency room for worsening condition or new concerning symptoms. Follow up with your regular doctor. If you don't have a regular doctor use one of the numbers below to establish a primary care doctor.  Please be aware that if you are taking birth control pills, taking other prescriptions, ESPECIALLY ANTIBIOTICS may make the birth control ineffective - if this is the case, either do not engage in sexual activity or use alternative methods of birth control such as condoms until you have finished the medicine and your family doctor says it is OK to restart them. If you are on a blood thinner such as COUMADIN, be aware that any other medicine that you take may cause the coumadin to either work too much, or not enough - you should have your coumadin level rechecked in next 7 days if this is the case.  ?  It is also a possibility that you have an allergic reaction to any of the medicines that you have been prescribed - Everybody reacts differently to medications and while MOST people have no trouble  with most medicines, you may have a reaction such as nausea, vomiting, rash, swelling, shortness of breath. If this is the case, please stop taking the medicine immediately and contact your physician.  ?  You should return to the ER if you develop severe or worsening symptoms.   Emergency Department Resource Guide 1) Find a Doctor and Pay Out of Pocket Although you won't have to find out who is covered by your insurance plan, it is a good idea to ask around and get recommendations. You will then need to call the office and see if the doctor you have chosen will accept you as a new patient and what types of options they offer for patients who are self-pay. Some doctors offer discounts or will set up payment plans for their patients who do not have insurance, but you will need to ask so you aren't surprised when you get to your appointment.  2) Contact Your Local Health Department Not all health departments have doctors that can see patients for sick visits, but many do, so it is worth a call to see if yours does. If you don't know where your local health department is, you can check in your phone book. The CDC also has a tool to help you locate your state's health department, and many state websites also have listings of all of their local health departments.  3) Find a Walk-in Clinic If your illness is not likely to be very severe  or complicated, you may want to try a walk in clinic. These are popping up all over the country in pharmacies, drugstores, and shopping centers. They're usually staffed by nurse practitioners or physician assistants that have been trained to treat common illnesses and complaints. They're usually fairly quick and inexpensive. However, if you have serious medical issues or chronic medical problems, these are probably not your best option.  No Primary Care Doctor: Call Health Connect at  [phone] - they can help you locate a primary care doctor that  accepts your insurance,  provides certain services, etc. Physician Referral [phone]  Emergency Department Resource Guide 1) Find a Doctor and Pay Out of Pocket Although you won't have to find out who is covered by your insurance plan, it is a good idea to ask around and get recommendations. You will then need to call the office and see if the doctor you have chosen will accept you as a new patient and what types of options they offer for patients who are self-pay. Some doctors offer discounts or will set up payment plans for their patients who do not have insurance, but you will need to ask so you aren't surprised when you get to your appointment.  2) Contact Your Local Health Department Not all health departments have doctors that can see patients for sick visits, but many do, so it is worth a call to see if yours does. If you don't know where your local health department is, you can check in your phone book. The CDC also has a tool to help you locate your state's health department, and many state websites also have listings of all of their local health departments.  3) Find a Walk-in Clinic If your illness is not likely to be very severe or complicated, you may want to try a walk in clinic. These are popping up all over the country in pharmacies, drugstores, and shopping centers. They're usually staffed by nurse practitioners or physician assistants that have been trained to treat common illnesses and complaints. They're usually fairly quick and inexpensive. However, if you have serious medical issues or chronic medical problems, these are probably not your best option.  No Primary Care Doctor: Call Health Connect at  [phone] - they can help you locate a primary care doctor that  accepts your insurance, provides certain services, etc. Physician Referral Service- [phone]  Chronic Pain Problems: Organization         Address  Phone   Notes  [facility]  [phone] Patients  need to be referred by their primary care doctor.   Medication Assistance: Organization         Address  Phone   Notes  [facility] Medication [facility] [address] [place] [place], [place] [address] [phone] --Must be a resident of [facility] -- Must have NO insurance coverage whatsoever (no Medicaid/ Medicare, etc.) -- The pt. MUST have a primary care doctor that directs their care regularly and follows them in the community   [facility]  [phone]   [employer]  [phone]    Agencies that provide inexpensive medical care: Organization         Address  Phone   Notes  [facility]  [phone]   [facility]    [phone]   [facility] [address] [place], [place] [address] [phone]   [facility] [address] [place]  [address] [place] [phone]   [facility]    [phone]   [facility]    [phone]   [facility]  [address], [place] Phone:  [phone], Fax:  [phone] Hours of Operation:  9 am - 6 pm, M-F.  Also accepts Medicaid/Medicare and self-pay.  [facility]  [address], [place] Phone: [phone], Fax: [phone]. Hours of Operation:  8:30 am - 5:30 pm, M-F.  Also accepts Medicaid and self-pay.  [facility] [place] [address] [place] Phone: [phone]   [facility] [address] Natasha Bence [place], [place] [phone], Ext. [phone] Mondays & Thursdays: 7-9 AM.  First 15 patients are seen on a first come, first serve basis.    Medicaid-accepting [facility] Providers:  Organization         Address  Phone   Notes  [facility] [address], [place] [phone] Also accepts self-pay patients.  [facility] [address] Laurell Josephs [place], [place]  [phone]     [facility] [address], [place] [phone]   [facility] [address], [place] [phone]   Renaye Rakers [address], [place]   [phone] Only accepts [place] patients after they have their name applied to their card.   Self-Pay (no insurance) in [facility]:  Organization         Address  Phone   Notes  Sickle Cell Patients, [facility] [address] [place], [place] [phone]   [facility] [address] [place], [place] [phone]   [facility]  [address], [place] [phone]   [facility]/Dr. Osei-Bonsu  [address] [place] or [address], [place] [phone] Phone number for both [place] and [place] locations is the same.  [facility] [address], [place] [phone]   [facility] [address], [place] or [address] [phone]   [facility] [address] [place] [phone], phone; [phone], fax Sees patients 1st and 3rd Saturday of every month.  Must not qualify for public or private insurance (i.e. Medicaid, Medicare, [place] Health Choice, Veterans' Benefits)  Household income should be no more than 200% of the poverty level The clinic cannot treat you if you are pregnant or think you are pregnant  Sexually transmitted diseases are not treated at the clinic.

## 2018-10-08 NOTE — ED Triage Notes (Signed)
Pt. Stated, Kenneth Lane had a cough that started a week ago and then my chest started hurting. I also have asthma and panic attacks.

## 2018-10-08 NOTE — ED Triage Notes (Signed)
Pt. Here a week ago for the same.

## 2018-10-08 NOTE — ED Provider Notes (Signed)
[name] EMERGENCY DEPARTMENT Provider Note   CSN: [id number] Arrival date & time: 10/08/18  4098    History   Chief Complaint Chief Complaint  Patient presents with  . Cough  . Chest Pain    HPI Kenneth Lane is a 33 y.o. male with a PMH of Asthma, Seasonal allergies, GERD, and Anxiety presenting with an intermittent dry cough onset 1 week ago. Patient also reports intermittent left sided chest pain onset a few days ago. Patient describes chest pain as a tightness that is only present with coughing. Patient reports associated sore throat, body aches, congestion, and rhinorrhea. Patient reports a few episodes of non bilious non bloody vomiting last night, but denies abdominal pain or diarrhea. Patient reports taking Advil without relief. Patient reports son is sick with similar symptoms. Patient states he was evaluated for similar symptoms last week, but symptoms have not improved. Patient denies recent travel out of the county. Patient denies exposure to COVID-19. Patient denies using recent travel, recent surgery, leg edema/pain, or hx of DVT/PE. Patient denies a personal or family history of heart problems. Patient reports tobacco use, occasional alcohol use, and marijuana use. Patient denies any other drug use.    HPI  Past Medical History:  Diagnosis Date  . Anxiety   . Asthma   . Bradycardia   . Seasonal allergies     There are no active problems to display for this patient.   Past Surgical History:  Procedure Laterality Date  . DENTAL SURGERY          Home Medications    Prior to Admission medications   Medication Sig Start Date End Date Taking? Authorizing Provider  benzonatate (TESSALON) 100 MG capsule Take 1 capsule (100 mg total) by mouth every 8 (eight) hours. 10/08/18   Carlyle Basques P, PA-C  hydrOXYzine (ATARAX/VISTARIL) 25 MG tablet Take 1 tablet (25 mg total) by mouth every 6 (six) hours as needed for anxiety. 06/29/18   Horton,  Mayer Masker, MD  ibuprofen (ADVIL,MOTRIN) 600 MG tablet Take 1 tablet (600 mg total) by mouth every 6 (six) hours as needed. Patient not taking: Reported on 06/29/2018 02/18/18   Eber Hong, MD  LORazepam (ATIVAN) 1 MG tablet Take 1 tablet (1 mg total) by mouth 3 (three) times daily as needed for anxiety. Patient not taking: Reported on 06/29/2018 01/22/17   Dione Booze, MD  methocarbamol (ROBAXIN) 500 MG tablet Take 1 tablet (500 mg total) by mouth 2 (two) times daily. 09/22/18   Hedges, Tinnie Gens, PA-C  omeprazole (PRILOSEC) 20 MG capsule Take 1 capsule (20 mg total) by mouth daily. 09/30/18   Georgetta Haber, NP  predniSONE (DELTASONE) 10 MG tablet Take 4 tablets (40 mg total) by mouth daily. Patient not taking: Reported on 06/29/2018 06/11/18   Tilden Fossa, MD  sucralfate (CARAFATE) 1 G tablet Take 1 tablet (1 g total) by mouth 4 (four) times daily. 06/08/11 10/20/11  Eber Hong, MD    Family History Family History  Problem Relation Age of Onset  . Cancer Other   . Hypertension Other     Social History Social History   Tobacco Use  . Smoking status: Current Every Day Smoker    Packs/day: 1.00    Types: Cigarettes  . Smokeless tobacco: Never Used  Substance Use Topics  . Alcohol use: Yes  . Drug use: Yes    Types: Marijuana     Allergies   Shellfish-derived products   Review of Systems  Review of Systems  Constitutional: Negative for activity change, appetite change, chills, diaphoresis, fatigue, fever and unexpected weight change.  HENT: Positive for congestion, rhinorrhea and sore throat. Negative for sinus pressure, sinus pain, trouble swallowing and voice change.   Eyes: Negative for pain, redness and itching.  Respiratory: Negative for cough, chest tightness, shortness of breath and wheezing.   Cardiovascular: Positive for chest pain. Negative for palpitations and leg swelling.  Gastrointestinal: Positive for vomiting. Negative for abdominal pain and nausea.   Endocrine: Negative for cold intolerance and heat intolerance.  Genitourinary: Negative for dysuria.  Musculoskeletal: Positive for myalgias. Negative for back pain and neck pain.  Skin: Negative for rash.  Neurological: Negative for dizziness, syncope, weakness, light-headedness and headaches.  Psychiatric/Behavioral: Negative for agitation and behavioral problems. The patient is not nervous/anxious.    Physical Exam Updated Vital Signs BP 126/85 (BP Location: Right Arm)   Pulse 83   Temp 98.3 F (36.8 C) (Oral)   Resp 20   SpO2 98%   Physical Exam Vitals signs and nursing note reviewed.  Constitutional:      General: He is not in acute distress.    Appearance: He is well-developed. He is not diaphoretic.  HENT:     Head: Normocephalic and atraumatic.     Right Ear: Tympanic membrane, ear canal and external ear normal.     Left Ear: Tympanic membrane, ear canal and external ear normal.     Nose: Mucosal edema and congestion present.     Mouth/Throat:     Mouth: Mucous membranes are moist.     Pharynx: Uvula midline. Posterior oropharyngeal erythema present.     Tonsils: No tonsillar exudate or tonsillar abscesses.  Eyes:     Extraocular Movements: Extraocular movements intact.     Pupils: Pupils are equal, round, and reactive to light.  Neck:     Musculoskeletal: Normal range of motion and neck supple.  Cardiovascular:     Rate and Rhythm: Normal rate and regular rhythm.     Heart sounds: Normal heart sounds. No murmur. No friction rub. No gallop.   Pulmonary:     Effort: Pulmonary effort is normal. No respiratory distress.     Breath sounds: Normal breath sounds. No decreased breath sounds, wheezing, rhonchi or rales.  Abdominal:     Palpations: Abdomen is soft.     Tenderness: There is no abdominal tenderness.  Musculoskeletal: Normal range of motion.     Right lower leg: He exhibits no tenderness. No edema.     Left lower leg: He exhibits no tenderness. No edema.   Skin:    Findings: No erythema or rash.  Neurological:     Mental Status: He is alert.    ED Treatments / Results  Labs (all labs ordered are listed, but only abnormal results are displayed) Labs Reviewed  BASIC METABOLIC PANEL - Abnormal; Notable for the following components:      Result Value   Potassium 3.4 (*)    CO2 21 (*)    Glucose, Bld 124 (*)    All other components within normal limits  CBC WITH DIFFERENTIAL/PLATELET  INFLUENZA PANEL BY PCR (TYPE A & B)  I-STAT TROPONIN, ED    EKG EKG Interpretation  Date/Time:  Saturday October 08 2018 08:27:09 EDT Ventricular Rate:  72 PR Interval:  136 QRS Duration: 96 QT Interval:  366 QTC Calculation: 400 R Axis:   45 Text Interpretation:  Normal sinus rhythm Cannot rule out Inferior infarct ,  age undetermined Abnormal ECG similar to prior 3/20 Confirmed by Meridee Score [phone]) on 10/08/2018 9:53:55 AM   Radiology Dg Chest 2 View  Result Date: 10/08/2018 CLINICAL DATA:  Cough EXAM: CHEST - 2 VIEW COMPARISON:  06/24/2018 FINDINGS: Lungs are clear.  No pleural effusion or pneumothorax. The heart is normal in size. Visualized osseous structures are within normal limits. IMPRESSION: Normal chest radiographs. Electronically Signed   By: Charline Bills M.D.   On: 10/08/2018 09:20    Procedures Procedures (including critical care time)  Medications Ordered in ED Medications  ibuprofen (ADVIL,MOTRIN) tablet 800 mg (800 mg Oral Given 10/08/18 0131)     Initial Impression / Assessment and Plan / ED Course  I have reviewed the triage vital signs and the nursing notes.  Pertinent labs & imaging results that were available during my care of the patient were reviewed by me and considered in my medical decision making (see chart for details).  Clinical Course as of Oct 07 1005  Sat Oct 08, 2018  0932 CBC is unremarkable.  WBC: 7.1 [AH]  0933 Negative CXR.  DG Chest 2 View [AH]    Clinical Course User Index [AH]  Carlyle Basques P, PA-C      Pt CXR negative for acute infiltrate. Patients symptoms are consistent with URI, likely viral etiology. Chest pain is not likely of cardiac or pulmonary etiology d/t presentation, PERC negative, VSS, no tracheal deviation, no JVD or new murmur, RRR, breath sounds equal bilaterally, EKG without acute abnormalities, negative troponin, and negative CXR. Flu test is negative. Pt has been advised to return to the ED if CP becomes exertional, associated with diaphoresis or nausea, radiates to left jaw/arm, worsens or becomes concerning in any way. Pt will be discharged with symptomatic treatment. Patient is to be discharged with recommendation to follow up with PCP in regards to today's hospital visit. Pt appears reliable for follow up and is agreeable to discharge. Verbalizes understanding and is agreeable with plan. Pt is hemodynamically stable & in NAD prior to dc.    Final Clinical Impressions(s) / ED Diagnoses   Final diagnoses:  Viral URI with cough  Atypical chest pain    ED Discharge Orders         Ordered    benzonatate (TESSALON) 100 MG capsule  Every 8 hours     10/08/18 1006           Carlyle Basques [place], [place] 10/08/18 1105    Terrilee Files, MD 10/08/18 1728

## 2018-11-01 ENCOUNTER — Other Ambulatory Visit: Payer: Self-pay

## 2018-11-01 ENCOUNTER — Ambulatory Visit: Payer: Self-pay | Admitting: *Deleted

## 2018-11-01 ENCOUNTER — Encounter (HOSPITAL_COMMUNITY): Payer: Self-pay | Admitting: Emergency Medicine

## 2018-11-01 ENCOUNTER — Ambulatory Visit (HOSPITAL_COMMUNITY)
Admission: EM | Admit: 2018-11-01 | Discharge: 2018-11-01 | Disposition: A | Discharge status: Home / Self Care | Payer: Medicare Other | Attending: Family Medicine | Admitting: Family Medicine

## 2018-11-01 DIAGNOSIS — J4521 Mild intermittent asthma with (acute) exacerbation: Secondary | ICD-10-CM

## 2018-11-01 MED ORDER — ALBUTEROL SULFATE HFA 108 (90 BASE) MCG/ACT IN AERS: 2.0000 | INHALATION_SPRAY | Freq: Four times a day (QID) | RESPIRATORY_TRACT | 2 refills | Status: DC | PRN

## 2018-11-01 MED ORDER — LEVOCETIRIZINE DIHYDROCHLORIDE 5 MG PO TABS: 5.0000 mg | ORAL_TABLET | Freq: Every evening | ORAL | 3 refills | Status: DC

## 2018-11-01 MED ORDER — FLUTICASONE PROPIONATE HFA 110 MCG/ACT IN AERO: INHALATION_SPRAY | RESPIRATORY_TRACT | 12 refills | Status: DC

## 2018-11-01 NOTE — Telephone Encounter (Signed)
Pt called [facility] and was transferred to the Patient Engagement Center.   No PCP. I want to know the symptoms of the coronavirus.   He denies any exposure to the virus.   I have dry cough and wheezing.  I smoke and have asthma.  See triage notes for details.  He did not meet criteria for the coronavirus and I let him know this.   Due to his wheezing and coughing up greenish-yellow mucus and being out of his inhalers I instructed him to go to the Urgent Care.   He replied, "I'm on my way there now".  He is going to the [facility]  I let him know to call us back if we could be  Of further help.   He thanked me for my help.    Reason for Disposition . Wheezing is present  Answer Assessment - Initial Assessment Questions 1. ONSET: "When did the cough begin?"      I keep a cough.   I smoke and have asthma.    2. SEVERITY: "How bad is the cough today?"      I just spit up stuff when my asthma is flaring up.   I just want to know the symptoms of the coronavirus. 3. RESPIRATORY DISTRESS: "Describe your breathing."      I'm coughing greenish yellowish mucus.   No fever.   I have chest pain because I've been coughing.   I hurt when I'm coughing. 4. FEVER: "Do you have a fever?" If so, ask: "What is your temperature, how was it measured, and when did it start?"     No 5. SPUTUM: "Describe the color of your sputum" (clear, white, yellow, green)     Green and yellow mucus.I was this way before the coronavirus.   6. HEMOPTYSIS: "Are you coughing up any blood?" If so ask: "How much?" (flecks, streaks, tablespoons, etc.)     I have anxiety that makes it worse. 7. CARDIAC HISTORY: "Do you have any history of heart disease?" (e.g., heart attack, congestive heart failure)      No 8. LUNG HISTORY: "Do you have any history of lung disease?"  (e.g., pulmonary embolus, asthma, emphysema)     Asthma, no other risk factor. 9. PE RISK FACTORS: "Do you have a history of blood clots?" (or:  recent major surgery, recent prolonged travel, bedridden)     Asthma.   Not diabetic  10. OTHER SYMPTOMS: "Do you have any other symptoms?" (e.g., runny nose, wheezing, chest pain)       I'm wheezing now.   I ran out of my inhalers.   I have a sore throat I think from smoking cigarettes. 11. PREGNANCY: "Is there any chance you are pregnant?" "When was your last menstrual period?"       N/A 12. TRAVEL: "Have you traveled out of the country in the last month?" (e.g., travel history, exposures)       No travels or exposures.  Protocols used: COUGH - ACUTE PRODUCTIVE-A-AH

## 2018-11-01 NOTE — ED Triage Notes (Signed)
Pt pesents to [facility] for assessment after having an asthma attack at home, worsening dry cough with yellow/green sputum.  Denies known fever.  Pt shaky at triage.

## 2018-11-01 NOTE — ED Notes (Signed)
Patient able to ambulate independently  

## 2018-11-01 NOTE — ED Provider Notes (Signed)
[facility]    CSN: [id number] Arrival date & time: 11/01/18  [id number]   Chief Complaint  Patient presents with  . Asthma    Subjective: Patient is a 33 y.o. male here for cough/sob.  Duration: 2 weeks  Associated symptoms: sinus congestion, wheezing, shortness of breath, cough and chest tightness Denies: sinus pain, itchy watery eyes, ear fullness, ear pain, ear drainage, myalgia and fevers Treatment to date: None, ran out of SABA Sick contacts: No No recent travel.  ROS: Const: No fevers Lungs: +SOB   Past Medical History:  Diagnosis Date  . Anxiety   . Asthma   . Bradycardia   . Seasonal allergies     Objective: BP 124/69 (BP Location: Left Arm)   Pulse 65   Temp 98 F (36.7 C) (Oral)   Resp 18   SpO2 99%  General: Awake, appears stated age HEENT: MMM, EOMi, ears neg, L nare with rhinorrhea and edematous turbinate, less so on R, no pharyngeal exudate/erythema Heart: RRR, no murmurs Lungs: CTAB, no rales, faint exp wheezes at bases. No accessory muscle use Psych: Age appropriate judgment and insight, normal affect and mood  Final diagnoses:  Mild intermittent asthma with exacerbation     Discharge Instructions     Rinse your mouth out after you use the Flovent (twice daily inhaler).  Claritin (loratadine), Allegra (fexofenadine), Zyrtec (cetirizine) which is also equivalent to Xyzal (levocetirizine); these are listed in order from weakest to strongest. Generic, and therefore cheaper, options are in the parentheses.   Flonase (fluticasone); nasal spray that is over the counter. 2 sprays each nostril, once daily. Aim towards the same side eye when you spray.  There are available OTC, and the generic versions, which may be cheaper, are in parentheses. Show this to a pharmacist if you have trouble finding any of these items.  Asthma Action Plan There are 3 zones to consider: 1. Green Zone- This is the zone we hope to keep you in. Avoiding allergens  and compliance with inhalers will keep you in this safe zone. No need to take anything extra while in this zone. 2. Yellow Zone- This zone is where we can save time, money and visits. You are not doing well enough to be considered in the green zone, but not bad enough to be in the red zone. This is where we need to remove any allergen or factor that is contributing to your breathing issues. You have a separate inhaler (inhaled steroid) to take twice daily in addition to your other inhalers. This should give you the push you need to get back to the green zone. Contact our office if you have any questions. 3. Red Zone- This is the zone where you should consider calling 911 vs going to the ER. Despite compliance with inhalers/meds (or maybe because we haven't been using them), our breathing isn't where we want it to be. Albuterol isn't helping as much either and you need medical care. This is the zone we try to avoid as much as possible!     ED Prescriptions    Medication Sig Dispense Auth. Provider   albuterol (PROVENTIL HFA;VENTOLIN HFA) 108 (90 Base) MCG/ACT inhaler Inhale 2 puffs into the lungs every 6 (six) hours as needed for wheezing or shortness of breath. 1 Inhaler Sharlene DoryWendling,  Paul, DO   levocetirizine (XYZAL) 5 MG tablet Take 1 tablet (5 mg total) by mouth every evening. 30 tablet Sharlene DoryWendling,  Paul, DO   fluticasone (FLOVENT HFA) 110  MCG/ACT inhaler Take 2 puffs twice daily for 1-2 weeks when in your yellow zone. 1 Inhaler Sharlene Dory, DO       Arva Chafe [place], [place] 11/01/18 [phone]

## 2018-11-01 NOTE — Discharge Instructions (Signed)
Rinse your mouth out after you use the Flovent (twice daily inhaler).  Claritin (loratadine), Allegra (fexofenadine), Zyrtec (cetirizine) which is also equivalent to Xyzal (levocetirizine); these are listed in order from weakest to strongest. Generic, and therefore cheaper, options are in the parentheses.   Flonase (fluticasone); nasal spray that is over the counter. 2 sprays each nostril, once daily. Aim towards the same side eye when you spray.  There are available OTC, and the generic versions, which may be cheaper, are in parentheses. Show this to a pharmacist if you have trouble finding any of these items.  Asthma Action Plan There are 3 zones to consider: 1. Green Zone- This is the zone we hope to keep you in. Avoiding allergens and compliance with inhalers will keep you in this safe zone. No need to take anything extra while in this zone. 2. Yellow Zone- This zone is where we can save time, money and visits. You are not doing well enough to be considered in the green zone, but not bad enough to be in the red zone. This is where we need to remove any allergen or factor that is contributing to your breathing issues. You have a separate inhaler (inhaled steroid) to take twice daily in addition to your other inhalers. This should give you the push you need to get back to the green zone. Contact our office if you have any questions. 3. Red Zone- This is the zone where you should consider calling 911 vs going to the ER. Despite compliance with inhalers/meds (or maybe because we haven't been using them), our breathing isn't where we want it to be. Albuterol isn't helping as much either and you need medical care. This is the zone we try to avoid as much as possible!

## 2018-12-19 ENCOUNTER — Emergency Department (HOSPITAL_COMMUNITY): Payer: Medicare Other

## 2018-12-19 ENCOUNTER — Emergency Department (HOSPITAL_COMMUNITY)
Admission: EM | Admit: 2018-12-19 | Discharge: 2018-12-19 | Disposition: A | Discharge status: Home / Self Care | Payer: Medicare Other | Attending: Emergency Medicine | Admitting: Emergency Medicine

## 2018-12-19 ENCOUNTER — Encounter (HOSPITAL_COMMUNITY): Payer: Self-pay

## 2018-12-19 DIAGNOSIS — F17210 Nicotine dependence, cigarettes, uncomplicated: Secondary | ICD-10-CM | POA: Diagnosis not present

## 2018-12-19 DIAGNOSIS — R079 Chest pain, unspecified: Secondary | ICD-10-CM | POA: Diagnosis not present

## 2018-12-19 DIAGNOSIS — Z79899 Other long term (current) drug therapy: Secondary | ICD-10-CM | POA: Insufficient documentation

## 2018-12-19 DIAGNOSIS — M62838 Other muscle spasm: Secondary | ICD-10-CM | POA: Diagnosis not present

## 2018-12-19 DIAGNOSIS — J45909 Unspecified asthma, uncomplicated: Secondary | ICD-10-CM | POA: Insufficient documentation

## 2018-12-19 MED ORDER — LIDOCAINE 5 % EX PTCH: 1.0000 | MEDICATED_PATCH | CUTANEOUS | 0 refills | Status: DC | PRN

## 2018-12-19 NOTE — ED Provider Notes (Signed)
[name] Provider Note   CSN: [id number] Arrival date & time: 12/19/18  1250    History   Chief Complaint No chief complaint on file.   HPI Kenneth Lane is a 33 y.o. male patient presenting for evaluation of left lateral thorax pain.  Patient states that the past 2 weeks, he has been having intermittent cramping/spasming of his left lateral thorax.  Symptoms happen randomly, no correlation with exertion, inspiration, p.o. intake.  Pain lasts for a few seconds before resolving without intervention. Patient states he has a very active job, and thinks this might be part of what is going on.  He denies neck or back pain. He denies fevers, chills, cough, short sore throat, shortness of breath, anterior chest pain, nausea, vomiting, abdominal pain, urinary symptoms, normal bowel movements.  Patient states he has a history of anxiety for which he was on Vistaril, but was not tolerating the side effects so stopped taking it several days ago.  Additionally, he has a history of asthma, and has been having asthma attacks but has not been using his inhaler because this increases his anxiety.  He reports that he continues to smoke daily.  He has no other medical problems, takes no medications daily.  He has an appointment with his primary care doctor in 2 days.     HPI  Past Medical History:  Diagnosis Date  . Anxiety   . Asthma   . Bradycardia   . Seasonal allergies     There are no active problems to display for this patient.   Past Surgical History:  Procedure Laterality Date  . DENTAL SURGERY          Home Medications    Prior to Admission medications   Medication Sig Start Date End Date Taking? Authorizing Provider  albuterol (PROVENTIL HFA;VENTOLIN HFA) 108 (90 Base) MCG/ACT inhaler Inhale 2 puffs into the lungs every 6 (six) hours as needed for wheezing or shortness of breath. 11/01/18   Sharlene DoryWendling, Nicholas Paul, DO  fluticasone  (FLOVENT HFA) 110 MCG/ACT inhaler Take 2 puffs twice daily for 1-2 weeks when in your yellow zone. 11/01/18   Sharlene DoryWendling, Nicholas Paul, DO  hydrOXYzine (ATARAX/VISTARIL) 25 MG tablet Take 1 tablet (25 mg total) by mouth every 6 (six) hours as needed for anxiety. 06/29/18   Horton, Mayer Maskerourtney F, MD  levocetirizine (XYZAL) 5 MG tablet Take 1 tablet (5 mg total) by mouth every evening. 11/01/18   Sharlene DoryWendling, Nicholas Paul, DO  lidocaine (LIDODERM) 5 % Place 1 patch onto the skin as needed. Remove & Discard patch within 12 hours or as directed by MD 12/19/18   Tarin Johndrow, PA-C  sucralfate (CARAFATE) 1 G tablet Take 1 tablet (1 g total) by mouth 4 (four) times daily. 06/08/11 10/20/11  Eber HongMiller, Brian, MD    Family History Family History  Problem Relation Age of Onset  . Cancer Other   . Hypertension Other     Social History Social History   Tobacco Use  . Smoking status: Current Every Day Smoker    Packs/day: 1.00    Types: Cigarettes  . Smokeless tobacco: Never Used  Substance Use Topics  . Alcohol use: Yes  . Drug use: Yes    Types: Marijuana     Allergies   Shellfish-derived products   Review of Systems Review of Systems  Cardiovascular:       L lateral thorax pain, none currently  All other systems reviewed and are negative.  Physical Exam Updated Vital Signs BP 123/81   Pulse (!) 59   Temp 98.3 F (36.8 C) (Oral)   Resp 16   SpO2 98%   Physical Exam Vitals signs and nursing note reviewed.  Constitutional:      General: He is not in acute distress.    Appearance: He is well-developed.     Comments: appears nontoxic  HENT:     Head: Normocephalic and atraumatic.  Neck:     Musculoskeletal: Normal range of motion.  Cardiovascular:     Rate and Rhythm: Normal rate and regular rhythm.     Pulses: Normal pulses.  Pulmonary:     Effort: Pulmonary effort is normal.     Breath sounds: Normal breath sounds.     Comments: Speaking in full sentences. Clear lung  sounds in all fields. Pt without pain currently, and no ttp currently. Location of pain is along the L lateral thorax. No lesions or rashes Abdominal:     General: There is no distension.     Palpations: There is no mass.     Tenderness: There is no abdominal tenderness. There is no guarding or rebound.  Musculoskeletal: Normal range of motion.  Skin:    General: Skin is warm.     Capillary Refill: Capillary refill takes less than 2 seconds.     Findings: No rash.  Neurological:     Mental Status: He is alert and oriented to person, place, and time.      ED Treatments / Results  Labs (all labs ordered are listed, but only abnormal results are displayed) Labs Reviewed - No data to display  EKG None  Radiology Dg Chest 2 View  Result Date: 12/19/2018 CLINICAL DATA:  33 year old male with a history chest pain for 2 weeks EXAM: CHEST - 2 VIEW COMPARISON:  10/08/2010 FINDINGS: The heart size and mediastinal contours are within normal limits. Both lungs are clear. The visualized skeletal structures are unremarkable. IMPRESSION: No active cardiopulmonary disease. Electronically Signed   By: Gilmer Mor D.O.   On: 12/19/2018 13:51    Procedures Procedures (including critical care time)  Medications Ordered in ED Medications - No data to display   Initial Impression / Assessment and Plan / ED Course  I have reviewed the triage vital signs and the nursing notes.  Pertinent labs & imaging results that were available during my care of the patient were reviewed by me and considered in my medical decision making (see chart for details).        Patient presenting for evaluation of left lateral thorax pain.  Physical exam reassuring, he appears nontoxic.  He is not having pain currently.  No infectious symptoms such as fever or cough, low suspicion for pneumonia.  Patient without pain that extends to the anterior chest, low suspicion for ACS, as his only risk factor is smoking.  Pain  is intermittent, not associated with exertion or inspiration, doubt PE. PERC negative.  This is described as a spasm or cramp, as such, likely muscular.  Additionally, consider anxiety, as patient is no longer taking medication for this and reports increased anxiety recently.  Will obtain x-ray to ensure no abnormalities of the lumbar ribs and reassess.  X-ray viewed interpreted by me, no pneumonia, pneumothorax, effusion, or bony abnormality.  Discussed findings with patient.  Discussed trial of lidocaine patches for symptom control if pain persists and follow-up with his primary care doctor in 2 days as scheduled.  At this time, patient  appears safe for discharge.  Return precautions given.  Patient states he understands agrees plan.   Final Clinical Impressions(s) / ED Diagnoses   Final diagnoses:  Muscle spasm    ED Discharge Orders         Ordered    lidocaine (LIDODERM) 5 %  As needed     12/19/18 1409           Mariafernanda Hendricksen, PA-C 12/19/18 1656    Cathren Laine, MD 12/20/18 1428

## 2018-12-19 NOTE — ED Notes (Signed)
Patient verbalizes understanding of discharge instructions. Opportunity for questioning and answers were provided. Armband removed by staff, pt discharged from ED.  

## 2018-12-19 NOTE — Discharge Instructions (Addendum)
Use lidocaine patches as needed for spasm or pain. Make sure you stay well-hydrated with water. You may also try medication such as icy hot, salon poss, or Biofreeze. Follow-up with your primary care doctor at your scheduled appointment on Wednesday.  Discussed your symptoms of anxiety and pain.  Return to the emergency room if you develop fevers, persistent chest pain, difficulty breathing, or any new, worsening, concerning symptoms.

## 2018-12-19 NOTE — ED Triage Notes (Addendum)
Pt endorses left sided rib cage pain x2 weeks. Pt describes pain as cramping and currently 7/10. Pain is intermittent. Pt denies specific injury, but endorses heavy lifting. Pt denies contact with sick persons. Pt denies urinary symptoms. Hx of asthma and current smoker. Pt alert, oriented, and ambulatory on arrival.

## 2019-01-09 ENCOUNTER — Emergency Department (HOSPITAL_COMMUNITY)
Admission: EM | Admit: 2019-01-09 | Discharge: 2019-01-09 | Disposition: A | Discharge status: Home / Self Care | Payer: Medicare Other | Attending: Emergency Medicine | Admitting: Emergency Medicine

## 2019-01-09 ENCOUNTER — Encounter (HOSPITAL_COMMUNITY): Payer: Self-pay

## 2019-01-09 DIAGNOSIS — F410 Panic disorder [episodic paroxysmal anxiety] without agoraphobia: Secondary | ICD-10-CM | POA: Diagnosis not present

## 2019-01-09 DIAGNOSIS — Z209 Contact with and (suspected) exposure to unspecified communicable disease: Secondary | ICD-10-CM | POA: Diagnosis not present

## 2019-01-09 DIAGNOSIS — R457 State of emotional shock and stress, unspecified: Secondary | ICD-10-CM | POA: Diagnosis not present

## 2019-01-09 DIAGNOSIS — F17210 Nicotine dependence, cigarettes, uncomplicated: Secondary | ICD-10-CM | POA: Diagnosis not present

## 2019-01-09 DIAGNOSIS — Z79899 Other long term (current) drug therapy: Secondary | ICD-10-CM | POA: Insufficient documentation

## 2019-01-09 DIAGNOSIS — R002 Palpitations: Secondary | ICD-10-CM | POA: Diagnosis not present

## 2019-01-09 DIAGNOSIS — J45909 Unspecified asthma, uncomplicated: Secondary | ICD-10-CM | POA: Diagnosis not present

## 2019-01-09 NOTE — ED Triage Notes (Signed)
Pt comes via [facility] for a panic attack, states that he is prescribed atarax but he does not take them because they make him feel high. Denies SI/HI/AVH

## 2019-01-09 NOTE — Discharge Instructions (Signed)
We recommend follow-up with a primary care doctor or psychiatrist as you may benefit from use of medications that you take daily for anxiety.  We recommend that you take hydroxyzine prior to bed to try and help facilitate sleep and lessen the likelihood of anxiety attacks during the night.  You may return for any new or concerning symptoms.

## 2019-01-09 NOTE — ED Provider Notes (Signed)
[name] Provider Note   CSN: [id number] Arrival date & time: 01/09/19  0122     History   Chief Complaint Chief Complaint  Patient presents with  . Panic Attack    HPI Kenneth Lane is a 33 y.o. male.     33 year old male with a history of anxiety presents to the emergency department for evaluation of anxiety attack characterized by hyperventilation and palpitations.  He did not take his prescribed hydroxyzine as he does not like the side effects.  Denies following up with a primary care doctor or psychiatrist.  Has never been on a daily maintenance medication for anxiety.  Is presently asymptomatic on my assessment.  No complaints of chest pain or shortness of breath at this time.  The history is provided by the patient. No language interpreter was used.    Past Medical History:  Diagnosis Date  . Anxiety   . Asthma   . Bradycardia   . Seasonal allergies     There are no active problems to display for this patient.   Past Surgical History:  Procedure Laterality Date  . DENTAL SURGERY          Home Medications    Prior to Admission medications   Medication Sig Start Date End Date Taking? Authorizing Provider  albuterol (PROVENTIL HFA;VENTOLIN HFA) 108 (90 Base) MCG/ACT inhaler Inhale 2 puffs into the lungs every 6 (six) hours as needed for wheezing or shortness of breath. 11/01/18   Sharlene DoryWendling, Nicholas Paul, DO  fluticasone (FLOVENT HFA) 110 MCG/ACT inhaler Take 2 puffs twice daily for 1-2 weeks when in your yellow zone. 11/01/18   Sharlene DoryWendling, Nicholas Paul, DO  hydrOXYzine (ATARAX/VISTARIL) 25 MG tablet Take 1 tablet (25 mg total) by mouth every 6 (six) hours as needed for anxiety. 06/29/18   Horton, Mayer Maskerourtney F, MD  levocetirizine (XYZAL) 5 MG tablet Take 1 tablet (5 mg total) by mouth every evening. 11/01/18   Sharlene DoryWendling, Nicholas Paul, DO  lidocaine (LIDODERM) 5 % Place 1 patch onto the skin as needed. Remove & Discard patch  within 12 hours or as directed by MD 12/19/18   Caccavale, Sophia, PA-C  sucralfate (CARAFATE) 1 G tablet Take 1 tablet (1 g total) by mouth 4 (four) times daily. 06/08/11 10/20/11  Eber HongMiller, Brian, MD    Family History Family History  Problem Relation Age of Onset  . Cancer Other   . Hypertension Other     Social History Social History   Tobacco Use  . Smoking status: Current Every Day Smoker    Packs/day: 1.00    Types: Cigarettes  . Smokeless tobacco: Never Used  Substance Use Topics  . Alcohol use: Yes  . Drug use: Yes    Types: Marijuana     Allergies   Shellfish-derived products   Review of Systems Review of Systems Ten systems reviewed and are negative for acute change, except as noted in the HPI.    Physical Exam Updated Vital Signs BP (!) 130/94 (BP Location: Right Arm)   Pulse (!) 50   Temp 97.9 F (36.6 C) (Oral)   Resp 20   SpO2 99%   Physical Exam Vitals signs and nursing note reviewed.  Constitutional:      General: He is not in acute distress.    Appearance: He is well-developed. He is not diaphoretic.     Comments: Nontoxic appearing and in NAD  HENT:     Head: Normocephalic and atraumatic.  Eyes:  General: No scleral icterus.    Conjunctiva/sclera: Conjunctivae normal.  Neck:     Musculoskeletal: Normal range of motion.  Cardiovascular:     Rate and Rhythm: Normal rate and regular rhythm.     Pulses: Normal pulses.  Pulmonary:     Effort: Pulmonary effort is normal. No respiratory distress.     Breath sounds: No stridor. No wheezing.     Comments: Respirations even and unlabored Musculoskeletal: Normal range of motion.  Skin:    General: Skin is warm and dry.     Coloration: Skin is not pale.     Findings: No erythema or rash.  Neurological:     General: No focal deficit present.     Mental Status: He is alert and oriented to person, place, and time.     Coordination: Coordination normal.  Psychiatric:        Behavior: Behavior  normal.      ED Treatments / Results  Labs (all labs ordered are listed, but only abnormal results are displayed) Labs Reviewed - No data to display  EKG    Radiology No results found.  Procedures Procedures (including critical care time)  Medications Ordered in ED Medications - No data to display   Initial Impression / Assessment and Plan / ED Course  I have reviewed the triage vital signs and the nursing notes.  Pertinent labs & imaging results that were available during my care of the patient were reviewed by me and considered in my medical decision making (see chart for details).        Patient presenting for panic attack, now resolved.  His evaluation is reassuring.  No shortness of breath, hypoxia.  Reports palpitations previously, but with sinus bradycardia presently.  He has been referred to primary care and/or psychiatry for follow-up.  Would likely benefit from maintenance anxiety medication such as Zoloft, Celexa, etc... rather than PRN meds. Can continue hydroxyzine PRN for now. Return precautions discussed and provided. Patient discharged in stable condition with no unaddressed concerns.   Final Clinical Impressions(s) / ED Diagnoses   Final diagnoses:  Panic attack    ED Discharge Orders    None       Antonietta Breach, PA-C 01/09/19 0416    Veryl Speak, MD 01/09/19 [phone]

## 2019-02-01 ENCOUNTER — Emergency Department (HOSPITAL_COMMUNITY)
Admission: EM | Admit: 2019-02-01 | Discharge: 2019-02-01 | Disposition: A | Discharge status: Home / Self Care | Payer: Medicare Other | Attending: Emergency Medicine | Admitting: Emergency Medicine

## 2019-02-01 ENCOUNTER — Other Ambulatory Visit: Payer: Self-pay

## 2019-02-01 ENCOUNTER — Emergency Department (HOSPITAL_COMMUNITY): Payer: Medicare Other

## 2019-02-01 ENCOUNTER — Encounter (HOSPITAL_COMMUNITY): Payer: Self-pay | Admitting: *Deleted

## 2019-02-01 DIAGNOSIS — F17210 Nicotine dependence, cigarettes, uncomplicated: Secondary | ICD-10-CM | POA: Diagnosis not present

## 2019-02-01 DIAGNOSIS — R0789 Other chest pain: Secondary | ICD-10-CM | POA: Diagnosis not present

## 2019-02-01 DIAGNOSIS — R059 Cough, unspecified: Secondary | ICD-10-CM

## 2019-02-01 DIAGNOSIS — R0602 Shortness of breath: Secondary | ICD-10-CM | POA: Diagnosis not present

## 2019-02-01 DIAGNOSIS — R05 Cough: Secondary | ICD-10-CM | POA: Insufficient documentation

## 2019-02-01 DIAGNOSIS — Z79899 Other long term (current) drug therapy: Secondary | ICD-10-CM | POA: Diagnosis not present

## 2019-02-01 DIAGNOSIS — J45909 Unspecified asthma, uncomplicated: Secondary | ICD-10-CM | POA: Diagnosis not present

## 2019-02-01 NOTE — ED Provider Notes (Signed)
TIME SEEN: 4:17 AM  CHIEF COMPLAINT: Chest pain, shortness of breath, cough  HPI: Patient is a 33 year old male with history of asthma, GERD who presents to the emergency department with burning chest pain that started earlier today and has now resolved that feels similar to his previous episodes of GERD.  States he did not take any medications prior to arrival.  Also reports he felt short of breath and has had a dry cough but this has also improved.  No fevers or chills.  Does not think he has been exposed to COVID-19.  No history of CAD, CHF, PE, DVT.  No lower extremity swelling or pain.  He states he thinks his cough and shortness of breath are secondary to smoking cigarettes.  He reports he is feeling better and would like discharge home.  He denies any wheezing.  No history of recent fractures, surgery, trauma, hospitalization or prolonged travel. No lower extremity swelling or pain. No calf tenderness.   ROS: See HPI Constitutional: no fever  Eyes: no drainage  ENT: no runny nose   Cardiovascular:   chest pain  Resp:  SOB  GI: no vomiting GU: no dysuria Integumentary: no rash  Allergy: no hives  Musculoskeletal: no leg swelling  Neurological: no slurred speech ROS otherwise negative  PAST MEDICAL HISTORY/PAST SURGICAL HISTORY:  Past Medical History:  Diagnosis Date  . Anxiety   . Asthma   . Bradycardia   . Seasonal allergies     MEDICATIONS:  Prior to Admission medications   Medication Sig Start Date End Date Taking? Authorizing Provider  albuterol (PROVENTIL HFA;VENTOLIN HFA) 108 (90 Base) MCG/ACT inhaler Inhale 2 puffs into the lungs every 6 (six) hours as needed for wheezing or shortness of breath. 11/01/18   Shelda Pal, DO  fluticasone (FLOVENT HFA) 110 MCG/ACT inhaler Take 2 puffs twice daily for 1-2 weeks when in your yellow zone. 11/01/18   Shelda Pal, DO  hydrOXYzine (ATARAX/VISTARIL) 25 MG tablet Take 1 tablet (25 mg total) by mouth every 6  (six) hours as needed for anxiety. 06/29/18   Horton, Barbette Hair, MD  levocetirizine (XYZAL) 5 MG tablet Take 1 tablet (5 mg total) by mouth every evening. 11/01/18   Shelda Pal, DO  lidocaine (LIDODERM) 5 % Place 1 patch onto the skin as needed. Remove & Discard patch within 12 hours or as directed by MD 12/19/18   Caccavale, Sophia, PA-C  sucralfate (CARAFATE) 1 G tablet Take 1 tablet (1 g total) by mouth 4 (four) times daily. 06/08/11 10/20/11  Noemi Chapel, MD    ALLERGIES:  Allergies  Allergen Reactions  . Shellfish-Derived Products Anaphylaxis, Swelling and Rash    SOCIAL HISTORY:  Social History   Tobacco Use  . Smoking status: Current Every Day Smoker    Packs/day: 1.00    Types: Cigarettes  . Smokeless tobacco: Never Used  Substance Use Topics  . Alcohol use: Yes    FAMILY HISTORY: Family History  Problem Relation Age of Onset  . Cancer Other   . Hypertension Other     EXAM: BP 113/67 (BP Location: Right Arm)   Pulse 95   Temp 98.4 F (36.9 C) (Oral)   Resp 16   SpO2 100%  CONSTITUTIONAL: Alert and oriented and responds appropriately to questions. Well-appearing; well-nourished HEAD: Normocephalic EYES: Conjunctivae clear, pupils appear equal, EOMI ENT: normal nose; moist mucous membranes NECK: Supple, no meningismus, no nuchal rigidity, no LAD  CARD: RRR; S1 and S2 appreciated; no  murmurs, no clicks, no rubs, no gallops RESP: Normal chest excursion without splinting or tachypnea; breath sounds clear and equal bilaterally; no wheezes, no rhonchi, no rales, no hypoxia or respiratory distress, speaking full sentences ABD/GI: Normal bowel sounds; non-distended; soft, non-tender, no rebound, no guarding, no peritoneal signs, no hepatosplenomegaly BACK:  The back appears normal and is non-tender to palpation, there is no CVA tenderness EXT: Normal ROM in all joints; non-tender to palpation; no edema; normal capillary refill; no cyanosis, no calf tenderness  or swelling    SKIN: Normal color for age and race; warm; no rash NEURO: Moves all extremities equally PSYCH: The patient's mood and manner are appropriate. Grooming and personal hygiene are appropriate.  MEDICAL DECISION MAKING: Patient here with atypical chest pain and shortness of breath.  EKG shows no ischemic abnormality and chest x-ray is clear.  Lungs are clear to auscultation without increased work of breathing or hypoxia.  Discussed with him that this could be a viral illness causing his symptoms and we discussed the possibility of coronavirus although he states he has not been going anywhere and has been staying at home.  He would be very low risk.  He has not had any fever.  He thinks that his cough is secondary to tobacco use.  Have recommended tobacco cessation and he agrees.  I do not feel he needs antibiotics at this time.  No wheezing to suggest asthma exacerbation.  He is PERC negative.  Doubt PE, ACS, dissection.  Discussed return precautions.  Recommended over-the-counter medications as needed for acid reflux given he states his pain in his chest earlier felt like GERD.  At this time, I do not feel there is any life-threatening condition present. I have reviewed and discussed all results (EKG, imaging, lab, urine as appropriate) and exam findings with patient/family. I have reviewed nursing notes and appropriate previous records.  I feel the patient is safe to be discharged home without further emergent workup and can continue workup as an outpatient as needed. Discussed usual and customary return precautions. Patient/family verbalize understanding and are comfortable with this plan.  Outpatient follow-up has been provided as needed. All questions have been answered.      EKG Interpretation  Date/Time:  Wednesday February 01 2019 01:13:55 EDT Ventricular Rate:  56 PR Interval:  160 QRS Duration: 104 QT Interval:  432 QTC Calculation: 416 R Axis:   59 Text Interpretation:  Sinus  bradycardia with sinus arrhythmia Minimal voltage criteria for LVH, may be normal variant Borderline ECG No significant change since last tracing Confirmed by Michell Kader, Baxter HireKristen [phone]) on 02/01/2019 3:53:15 AM         Rihana Kiddy, Layla MawKristen N, DO 02/01/19 [id number]

## 2019-02-01 NOTE — Discharge Instructions (Signed)
You may alternate Tylenol 1000 mg every 6 hours as needed for pain and Ibuprofen 800 mg every 8 hours as needed for pain.  Please take Ibuprofen with food.  You may use over-the-counter Tums, Pepcid, omeprazole as needed for heartburn.

## 2019-02-01 NOTE — ED Notes (Signed)
PT WAITING IN PEDS WAITING AREA

## 2019-02-01 NOTE — ED Triage Notes (Signed)
Pt c/o anxiety, chest wall pain (tender to palpation and worse with cough), headache, and some SOB. Lungs clear, no distress.

## 2019-02-27 ENCOUNTER — Encounter (HOSPITAL_COMMUNITY): Payer: Self-pay | Admitting: Emergency Medicine

## 2019-02-27 ENCOUNTER — Ambulatory Visit (HOSPITAL_COMMUNITY)
Admission: EM | Admit: 2019-02-27 | Discharge: 2019-02-27 | Disposition: A | Discharge status: Home / Self Care | Payer: Medicare Other | Attending: Family Medicine | Admitting: Family Medicine

## 2019-02-27 ENCOUNTER — Other Ambulatory Visit: Payer: Self-pay

## 2019-02-27 DIAGNOSIS — Z113 Encounter for screening for infections with a predominantly sexual mode of transmission: Secondary | ICD-10-CM | POA: Insufficient documentation

## 2019-02-27 DIAGNOSIS — F411 Generalized anxiety disorder: Secondary | ICD-10-CM | POA: Insufficient documentation

## 2019-02-27 LAB — POCT URINALYSIS DIP (DEVICE)
Bilirubin Urine: NEGATIVE
Glucose, UA: NEGATIVE mg/dL
Hgb urine dipstick: NEGATIVE
Ketones, ur: NEGATIVE mg/dL
Leukocytes,Ua: NEGATIVE
Nitrite: NEGATIVE
Protein, ur: NEGATIVE mg/dL
Specific Gravity, Urine: >=1.03 (ref 1.005–1.030)
Urobilinogen, UA: 4 mg/dL — ABNORMAL HIGH (ref 0.0–1.0)
pH: 7 (ref 5.0–8.0)

## 2019-02-27 MED ORDER — HYDROXYZINE HCL 25 MG PO TABS: 25.0000 mg | ORAL_TABLET | Freq: Three times a day (TID) | ORAL | 0 refills | Status: DC | PRN

## 2019-02-27 MED ORDER — ESCITALOPRAM OXALATE 10 MG PO TABS: 10.0000 mg | ORAL_TABLET | Freq: Every day | ORAL | 1 refills | Status: DC

## 2019-02-27 NOTE — ED Provider Notes (Addendum)
[facility]    CSN: [id number] Arrival date & time: 02/27/19  1414     History   Chief Complaint Chief Complaint  Patient presents with  . SEXUALLY TRANSMITTED DISEASE    HPI Kenneth Lane is a 33 y.o. male.   Patient is a 33 year old male with past medical history of anxiety, asthma, allergies.  He is presenting today with multiple complaints.  First complaint is aching in the groin area.  This is been present for the past couple days.  This is been an intermittent problem.  Patient has high concern for STDs.  Denies any dysuria, hematuria or penile discharge.  Denies any fever or rash.  Pt also with concerns about anxiety. He has been dealing with this for over a year. He has been given hydroxyzine before which he reports makes him feel "loopy" and then just fall asleep.  He suffers from this daily.  Anxiety is worse at nighttime.  He denies any associated suicidal ideations, chest pain or shortness of breath.  Patient also would like to be COVID tested.  Denies any current symptoms.  ROS per HPI      Past Medical History:  Diagnosis Date  . Anxiety   . Asthma   . Bradycardia   . Seasonal allergies     There are no active problems to display for this patient.   Past Surgical History:  Procedure Laterality Date  . DENTAL SURGERY         Home Medications    Prior to Admission medications   Medication Sig Start Date End Date Taking? Authorizing Provider  albuterol (PROVENTIL HFA;VENTOLIN HFA) 108 (90 Base) MCG/ACT inhaler Inhale 2 puffs into the lungs every 6 (six) hours as needed for wheezing or shortness of breath. 11/01/18   Sharlene DoryWendling, Nicholas Paul, DO  escitalopram (LEXAPRO) 10 MG tablet Take 1 tablet (10 mg total) by mouth daily. 02/27/19   Norvil Martensen, Gloris Manchesterraci A, NP  fluticasone (FLOVENT HFA) 110 MCG/ACT inhaler Take 2 puffs twice daily for 1-2 weeks when in your yellow zone. 11/01/18   Sharlene DoryWendling, Nicholas Paul, DO  hydrOXYzine (ATARAX/VISTARIL) 25 MG tablet  Take 1 tablet (25 mg total) by mouth every 8 (eight) hours as needed for anxiety. 02/27/19   Torrin Frein, Gloris Manchesterraci A, NP  levocetirizine (XYZAL) 5 MG tablet Take 1 tablet (5 mg total) by mouth every evening. 11/01/18 02/27/19  Sharlene DoryWendling, Nicholas Paul, DO  sucralfate (CARAFATE) 1 G tablet Take 1 tablet (1 g total) by mouth 4 (four) times daily. 06/08/11 10/20/11  Eber HongMiller, Brian, MD    Family History Family History  Problem Relation Age of Onset  . Cancer Other   . Hypertension Other     Social History Social History   Tobacco Use  . Smoking status: Current Every Day Smoker    Packs/day: 1.00    Types: Cigarettes  . Smokeless tobacco: Never Used  Substance Use Topics  . Alcohol use: Yes  . Drug use: Yes    Types: Marijuana     Allergies   Shellfish-derived products   Review of Systems Review of Systems  HENT: Negative for congestion, ear pain, rhinorrhea and sore throat.   Respiratory: Negative for cough and shortness of breath.   Cardiovascular: Negative for chest pain.  Gastrointestinal: Negative for diarrhea.  Genitourinary: Negative for decreased urine volume, difficulty urinating, discharge, dysuria, enuresis, flank pain, frequency, genital sores, hematuria, penile pain, penile swelling, scrotal swelling, testicular pain and urgency.  Psychiatric/Behavioral: Negative for agitation and suicidal  ideas. The patient is nervous/anxious.      Physical Exam Triage Vital Signs ED Triage Vitals  Enc Vitals Group     BP 02/27/19 1439 124/80     Pulse Rate 02/27/19 1439 68     Resp 02/27/19 1439 16     Temp 02/27/19 1439 98.1 F (36.7 C)     Temp Source 02/27/19 1439 Temporal     SpO2 02/27/19 1439 100 %     Weight --      Height --      Head Circumference --      Peak Flow --      Pain Score 02/27/19 1442 4     Pain Loc --      Pain Edu? --      Excl. in GC? --    No data found.  Updated Vital Signs BP 124/80 (BP Location: Left Arm)   Pulse 68   Temp 98.1 F (36.7 C)  (Temporal)   Resp 16   SpO2 100%   Visual Acuity Right Eye Distance:   Left Eye Distance:   Bilateral Distance:    Right Eye Near:   Left Eye Near:    Bilateral Near:     Physical Exam Vitals signs and nursing note reviewed.  Constitutional:      General: He is not in acute distress.    Appearance: Normal appearance. He is well-developed. He is not ill-appearing, toxic-appearing or diaphoretic.  HENT:     Head: Normocephalic and atraumatic.     Nose: Nose normal.  Eyes:     Conjunctiva/sclera: Conjunctivae normal.  Neck:     Musculoskeletal: Normal range of motion and neck supple.  Cardiovascular:     Rate and Rhythm: Normal rate and regular rhythm.     Heart sounds: No murmur.  Pulmonary:     Effort: Pulmonary effort is normal. No respiratory distress.     Breath sounds: Normal breath sounds.  Musculoskeletal: Normal range of motion.  Skin:    General: Skin is warm and dry.  Neurological:     General: No focal deficit present.     Mental Status: He is alert.  Psychiatric:        Behavior: Behavior normal.     Comments: Anxious       UC Treatments / Results  Labs (all labs ordered are listed, but only abnormal results are displayed) Labs Reviewed  POCT URINALYSIS DIP (DEVICE) - Abnormal; Notable for the following components:      Result Value   Urobilinogen, UA 4.0 (*)    All other components within normal limits  URINE CYTOLOGY ANCILLARY ONLY    EKG   Radiology No results found.  Procedures Procedures (including critical care time)  Medications Ordered in UC Medications - No data to display  Initial Impression / Assessment and Plan / UC Course  I have reviewed the triage vital signs and the nursing notes.  Pertinent labs & imaging results that were available during my care of the patient were reviewed by me and considered in my medical decision making (see chart for details).     Generalized anxiety disorder-treating with Lexapro daily.  Instructed patient that this could take up to 2 weeks before he feels full effect of the medication.  He can take hydroxyzine as needed in the meantime for acute anxiety and panic.  Recommend following up with his primary care for further management of his anxiety.  Denies any suicidal ideations today.  Screening  for STD- reporting feeling "funny" in the groin area.  No penile discharge, dysuria or rashes. Urine sent for cytology with labs pending  COVID testing-patient asymptomatic, needed this for primary care follow-up. Final Clinical Impressions(s) / UC Diagnoses   Final diagnoses:  Generalized anxiety disorder  Screening for STD (sexually transmitted disease)     Discharge Instructions     Prescribing lexapro for anxiety. Take this medication every night before bed. It could take a few weeks before you see any improvement from the medication.  Hydroxyzine for acute panic attacks.  You need to follow up with primary care for further management of your anxiety.   Testing you for COVID and STDs.  Results are pending and should be resulted in 3 or 4 days.  Follow up as needed for continued or worsening symptoms     ED Prescriptions    Medication Sig Dispense Auth. Provider   hydrOXYzine (ATARAX/VISTARIL) 25 MG tablet Take 1 tablet (25 mg total) by mouth every 8 (eight) hours as needed for anxiety. 12 tablet Caiya Bettes A, NP   escitalopram (LEXAPRO) 10 MG tablet Take 1 tablet (10 mg total) by mouth daily. 30 tablet Loura Halt A, NP     Controlled Substance Prescriptions [place] Controlled Substance Registry consulted? Not Applicable   Orvan July, NP 02/27/19 1553    Loura Halt A, NP 02/27/19 1555

## 2019-02-27 NOTE — ED Triage Notes (Signed)
Aching in groin area, onset 3 days ago.  Denies penile discharge.    Patient reports a history of anxiety and feels he is having more trouble with this diagnosis lately.

## 2019-02-27 NOTE — Discharge Instructions (Addendum)
Prescribing lexapro for anxiety. Take this medication every night before bed. It could take a few weeks before you see any improvement from the medication.  Hydroxyzine for acute panic attacks.  You need to follow up with primary care for further management of your anxiety.   Testing you for COVID and STDs.  Results are pending and should be resulted in 3 or 4 days.  Follow up as needed for continued or worsening symptoms

## 2019-03-01 LAB — URINE CYTOLOGY ANCILLARY ONLY
Chlamydia: NEGATIVE
Neisseria Gonorrhea: NEGATIVE
Trichomonas: NEGATIVE

## 2019-03-14 ENCOUNTER — Emergency Department (HOSPITAL_COMMUNITY)
Admission: EM | Admit: 2019-03-14 | Discharge: 2019-03-14 | Disposition: A | Discharge status: Home / Self Care | Payer: Medicare Other | Attending: Emergency Medicine | Admitting: Emergency Medicine

## 2019-03-14 ENCOUNTER — Other Ambulatory Visit: Payer: Self-pay

## 2019-03-14 DIAGNOSIS — R202 Paresthesia of skin: Secondary | ICD-10-CM | POA: Diagnosis not present

## 2019-03-14 DIAGNOSIS — R200 Anesthesia of skin: Secondary | ICD-10-CM

## 2019-03-14 DIAGNOSIS — G5621 Lesion of ulnar nerve, right upper limb: Secondary | ICD-10-CM | POA: Insufficient documentation

## 2019-03-14 DIAGNOSIS — Z79899 Other long term (current) drug therapy: Secondary | ICD-10-CM | POA: Insufficient documentation

## 2019-03-14 DIAGNOSIS — F17210 Nicotine dependence, cigarettes, uncomplicated: Secondary | ICD-10-CM | POA: Diagnosis not present

## 2019-03-14 DIAGNOSIS — J45909 Unspecified asthma, uncomplicated: Secondary | ICD-10-CM | POA: Insufficient documentation

## 2019-03-14 LAB — CBG MONITORING, ED: Glucose-Capillary: 70 mg/dL (ref 70–99)

## 2019-03-14 MED ORDER — ESCITALOPRAM OXALATE 10 MG PO TABS
10.0000 mg | ORAL_TABLET | Freq: Every day | ORAL | Status: DC
  Filled 2019-03-14: qty 1

## 2019-03-14 MED ORDER — HYDROXYZINE HCL 25 MG PO TABS
25.0000 mg | ORAL_TABLET | Freq: Once | ORAL | Status: DC
  Filled 2019-03-14: qty 1

## 2019-03-14 NOTE — ED Triage Notes (Signed)
Patient reports R shoulder pain while at work today, no known injury. States he feels a tingling sensation going down his R arm as well since. CSM intact. Endorses hx anxiety and panic attacks, states he was concerned he was having a stroke.

## 2019-03-14 NOTE — ED Notes (Signed)
Pt dc'd home with all belongings, ambulatory upon dc, no narcotics given in ED. Pt refused Lexapro andydroxyzine, states he has them at home.

## 2019-03-14 NOTE — ED Provider Notes (Signed)
Medical screening examination/treatment/procedure(s) were conducted as a shared visit with non-physician practitioner(s) and myself.  I personally evaluated the patient during the encounter. Briefly, the patient is a 33 y.o. male with no significant medical history presents the ED with right arm numbness.  Patient states that several hours ago he started to have some numbness in his right arm.  He stated that he had some right shoulder pain.  Denies any weakness.  No chest pain, no shortness of breath.  Does not have any stroke risk factors.  Denies any severe alcohol or drug use.  Has may be some decrease sensation at the right elbow when compared to the left side but otherwise normal sensation and normal strength.  Does work a [employer] job.  Possibly some peripheral nerve irritation.  Does not have any headache, neck pain.  Overall no concern for stroke or TIA.  Recommend follow-up with primary care doctor.  Recommend conservative treatment with Tylenol, Motrin, light duty at work.  Given education about return precautions.  This chart was dictated using voice recognition software.  Despite best efforts to proofread,  errors can occur which can change the documentation meaning.     EKG Interpretation None          Lennice Sites, DO 03/14/19 1745

## 2019-03-14 NOTE — ED Provider Notes (Addendum)
[name] Provider Note   CSN: [id number] Arrival date & time: 03/14/19  1445    History   Chief Complaint Chief Complaint  Patient presents with  . Arm Pain    HPI Kenneth Lane is a 33 y.o. male.     33 y.o male with a PMH of Anxiety, Asthma presents to the ED with a chief complaint of right hand numbness x 4 hours. Patient reports he was at work when he felt a numbing sensation to the right arm, radiating from his right elbow to his right hand. He reports "I felt like I was having stroke". He also endorses his right arm began sweating and he felt clammy. He has not tried any medication for relieve in symptoms. According to patient's chart he does have a history of anxiety, he is currently on medication but did not take this today.   The history is provided by the patient.  Arm Pain This is a new problem. Pertinent negatives include no headaches.    Past Medical History:  Diagnosis Date  . Anxiety   . Asthma   . Bradycardia   . Seasonal allergies     There are no active problems to display for this patient.   Past Surgical History:  Procedure Laterality Date  . DENTAL SURGERY          Home Medications    Prior to Admission medications   Medication Sig Start Date End Date Taking? Authorizing Provider  albuterol (PROVENTIL HFA;VENTOLIN HFA) 108 (90 Base) MCG/ACT inhaler Inhale 2 puffs into the lungs every 6 (six) hours as needed for wheezing or shortness of breath. 11/01/18   Sharlene DoryWendling, Nicholas Paul, DO  escitalopram (LEXAPRO) 10 MG tablet Take 1 tablet (10 mg total) by mouth daily. 02/27/19   Bast, Gloris Manchesterraci A, NP  fluticasone (FLOVENT HFA) 110 MCG/ACT inhaler Take 2 puffs twice daily for 1-2 weeks when in your yellow zone. 11/01/18   Sharlene DoryWendling, Nicholas Paul, DO  hydrOXYzine (ATARAX/VISTARIL) 25 MG tablet Take 1 tablet (25 mg total) by mouth every 8 (eight) hours as needed for anxiety. 02/27/19   Bast, Gloris Manchesterraci A, NP  levocetirizine  (XYZAL) 5 MG tablet Take 1 tablet (5 mg total) by mouth every evening. 11/01/18 02/27/19  Sharlene DoryWendling, Nicholas Paul, DO  sucralfate (CARAFATE) 1 G tablet Take 1 tablet (1 g total) by mouth 4 (four) times daily. 06/08/11 10/20/11  Eber HongMiller, Brian, MD    Family History Family History  Problem Relation Age of Onset  . Cancer Other   . Hypertension Other     Social History Social History   Tobacco Use  . Smoking status: Current Every Day Smoker    Packs/day: 1.00    Types: Cigarettes  . Smokeless tobacco: Never Used  Substance Use Topics  . Alcohol use: Yes  . Drug use: Yes    Types: Marijuana     Allergies   Shellfish-derived products   Review of Systems Review of Systems  Constitutional: Negative for fever.  Neurological: Positive for numbness. Negative for dizziness, syncope, facial asymmetry and headaches.     Physical Exam Updated Vital Signs BP 121/74 (BP Location: Right Arm)   Pulse 62   Temp 98.7 F (37.1 C) (Oral)   Resp 15   SpO2 99%   Physical Exam Vitals signs and nursing note reviewed.  Constitutional:      Appearance: He is well-developed.  HENT:     Head: Normocephalic and atraumatic.  Eyes:  General: No scleral icterus.    Pupils: Pupils are equal, round, and reactive to light.  Neck:     Musculoskeletal: Normal range of motion.  Cardiovascular:     Heart sounds: Normal heart sounds.  Pulmonary:     Effort: Pulmonary effort is normal.     Breath sounds: Normal breath sounds. No wheezing.  Chest:     Chest wall: No tenderness.  Abdominal:     General: Bowel sounds are normal. There is no distension.     Palpations: Abdomen is soft.     Tenderness: There is no abdominal tenderness.  Musculoskeletal:        General: No tenderness or deformity.     Right elbow: Normal.He exhibits normal range of motion, no swelling, no effusion, no deformity and no laceration. No tenderness found.     Right wrist: He exhibits normal range of motion, no  tenderness, no bony tenderness, no swelling, no effusion, no crepitus, no deformity and no laceration.     Comments: Tingling and numbness along the right dorsum aspect of the forearm. Pulses present. Capillary refill is intact.  Numbness distribution along the ulnar nerve.  Skin:    General: Skin is warm and dry.  Neurological:     Mental Status: He is alert and oriented to person, place, and time.     Comments: Alert, oriented, thought content appropriate. Speech fluent without evidence of aphasia. Able to follow 2 step commands without difficulty.  Cranial Nerves:  II:  Peripheral visual fields grossly normal, pupils, round, reactive to light III,IV, VI: ptosis not present, extra-ocular motions intact bilaterally  V,VII: smile symmetric, facial light touch sensation equal VIII: hearing grossly normal bilaterally  IX,X: midline uvula rise  XI: bilateral shoulder shrug equal and strong XII: midline tongue extension  Motor:  5/5 in upper and lower extremities bilaterally including strong and equal grip strength and dorsiflexion/plantar flexion Sensory: light touch normal in all extremities.  Cerebellar: normal finger-to-nose with bilateral upper extremities, pronator drift negative        ED Treatments / Results  Labs (all labs ordered are listed, but only abnormal results are displayed) Labs Reviewed  CBG MONITORING, ED    EKG None  Radiology No results found.  Procedures Procedures (including critical care time)  Medications Ordered in ED Medications  escitalopram (LEXAPRO) tablet 10 mg (has no administration in time range)  hydrOXYzine (ATARAX/VISTARIL) tablet 25 mg (has no administration in time range)     Initial Impression / Assessment and Plan / ED Course  I have reviewed the triage vital signs and the nursing notes.  Pertinent labs & imaging results that were available during my care of the patient were reviewed by me and considered in my medical decision  making (see chart for details).      Patient with a past medical history of anxiety presents to the ED with right arm numbness, reports the symptoms began 4 hours ago.  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  During my evaluation his neuro exam is unremarkable.  Does have some decreased sensation along the distribution of the ulnar nerve, some suspicion for cubital tunnel syndrome.  Patient's otherwise neuro exam is unremarkable.  He did report having some right shoulder pain this morning, suspect likely not to be any central pathology.  Will have patient apply rice therapy along with follow-up with PCP as needed.  No other dysarthria, no changes in speech, he is very low risk for any stroke, does smoke a pack a day but symptoms not consistent with this diagnosis.  Would otherwise stable vital signs patient wise to follow-up with PCP as needed.  He was given his anxiety medication while in the ED Lexapro along with Atarax.  I have discussed this patient with Dr. Lockie Molauratolo who has also evaluated patient and agrees with management at this time.    Return precautions provided at length.  Portions of this note were generated with Scientist, clinical (histocompatibility and immunogenetics)Dragon dictation software. Dictation errors may occur despite best attempts at proofreading.   Final Clinical Impressions(s) / ED Diagnoses   Final diagnoses:  Right arm numbness  Cubital tunnel syndrome on right    ED Discharge Orders    None       Claude MangesSoto, Massa Pe, PA-C 03/14/19 1739    Claude MangesSoto, Sarthak Rubenstein, PA-C 03/14/19 1739    Virgina Norfolkuratolo, Adam, DO 03/14/19 1748

## 2019-03-14 NOTE — Discharge Instructions (Addendum)
Please alternate ibuprofen and Tylenol to help with your right arm numbness.  Please follow-up with your primary care physician in 1 week for reevaluation of symptoms.  If you experience any changes in speech, weakness of the right arm please return to the emergency department.

## 2019-03-14 NOTE — ED Notes (Addendum)
Pt reports acute onset of Right hand numbness starting today at 1300 while at work, associated with intermittent numbness to Right arm. He describes it as "throbbing and cold sensation" palpable radial pulse. Able to wiggle fingers, open and close hand. He is concerned for a stroke. Pt feels he's messed up a nerve because he has tingling in his Right elbow radiating to his Right shoulder

## 2019-03-20 IMAGING — CR DG CHEST 2V
2 series · 2 of 2 positions shown · non-contrast
Comparison: None.

CLINICAL DATA: Three day history of cough and two days of nausea
with two episodes of vomiting today.

EXAM:
CHEST - 2 VIEW

[chest pa]
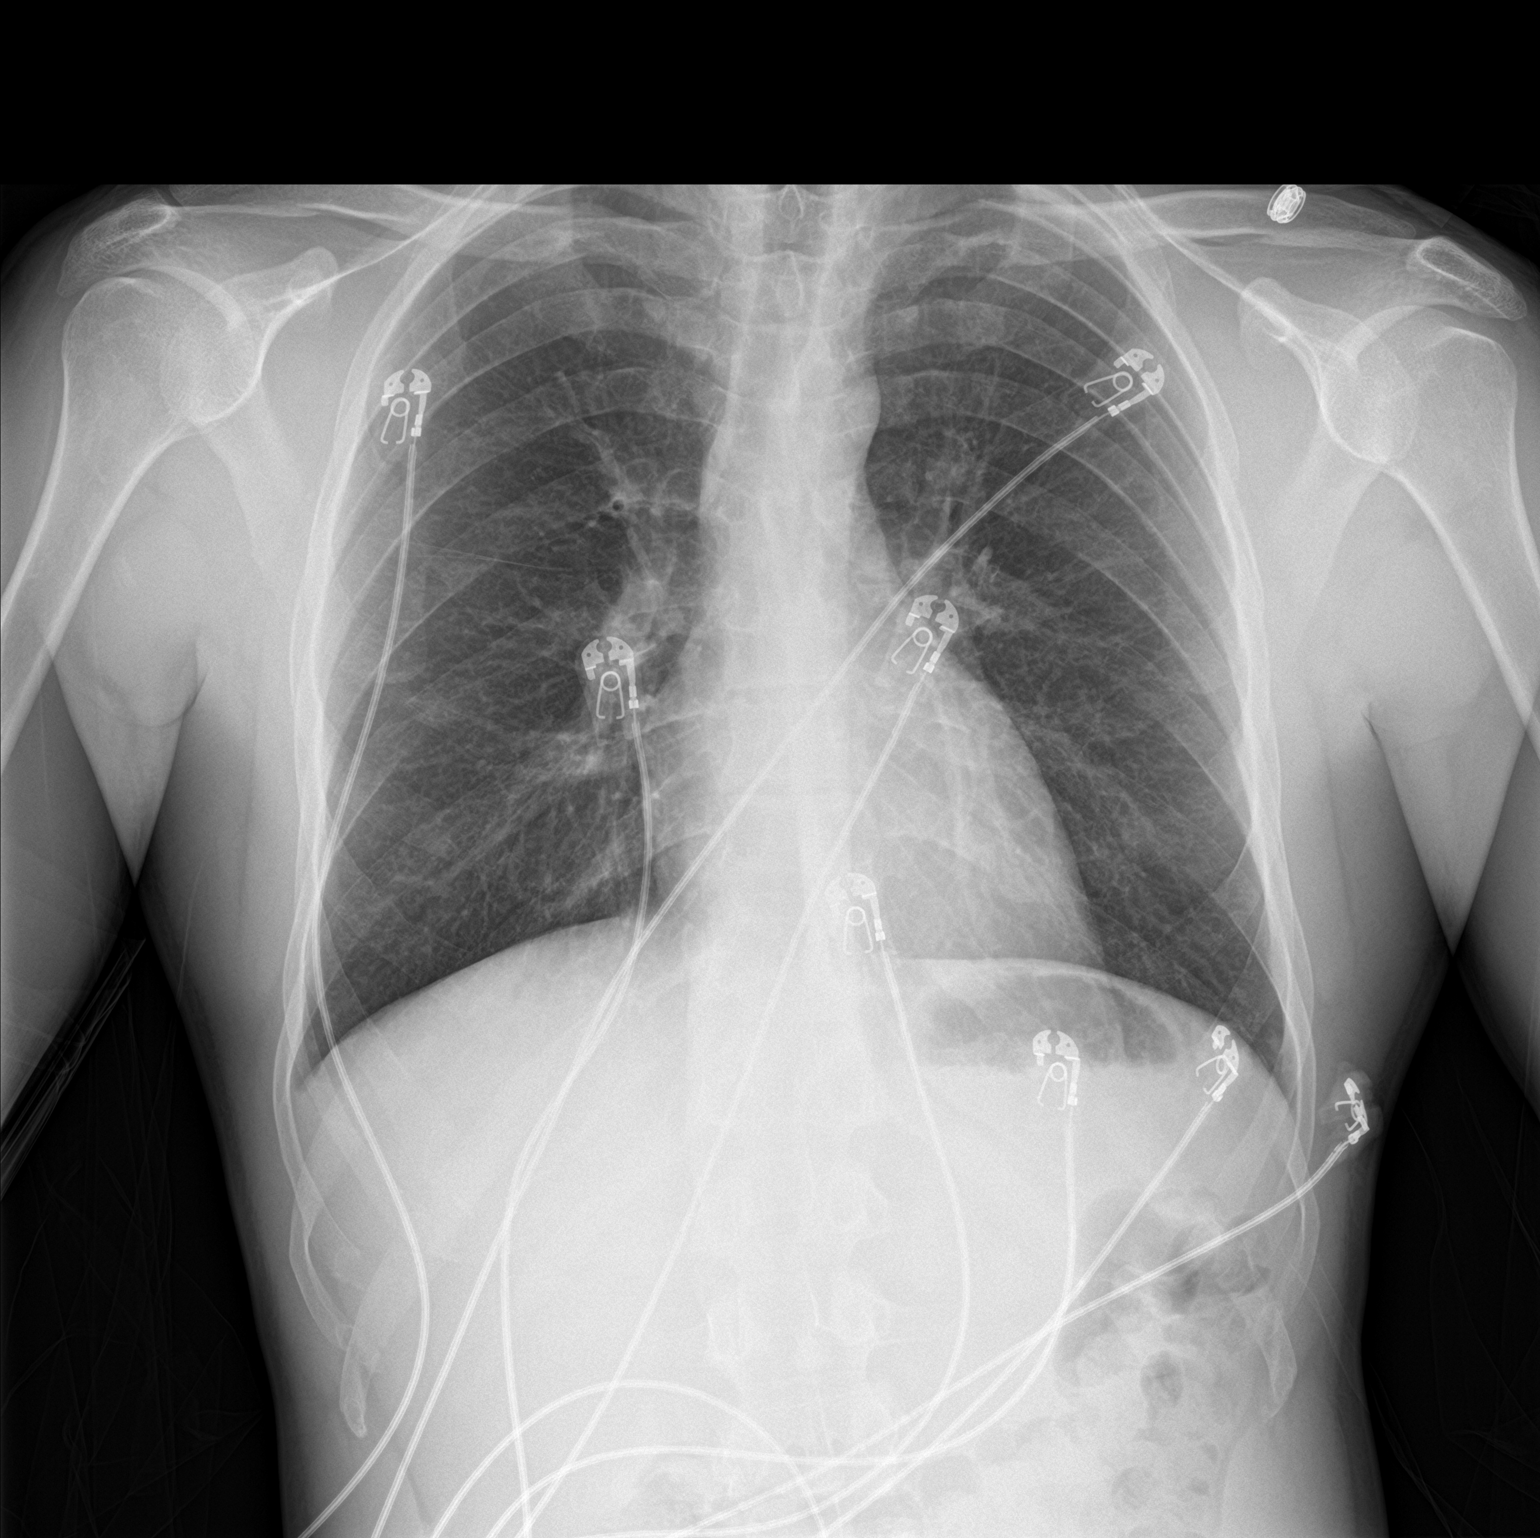

[chest lat]
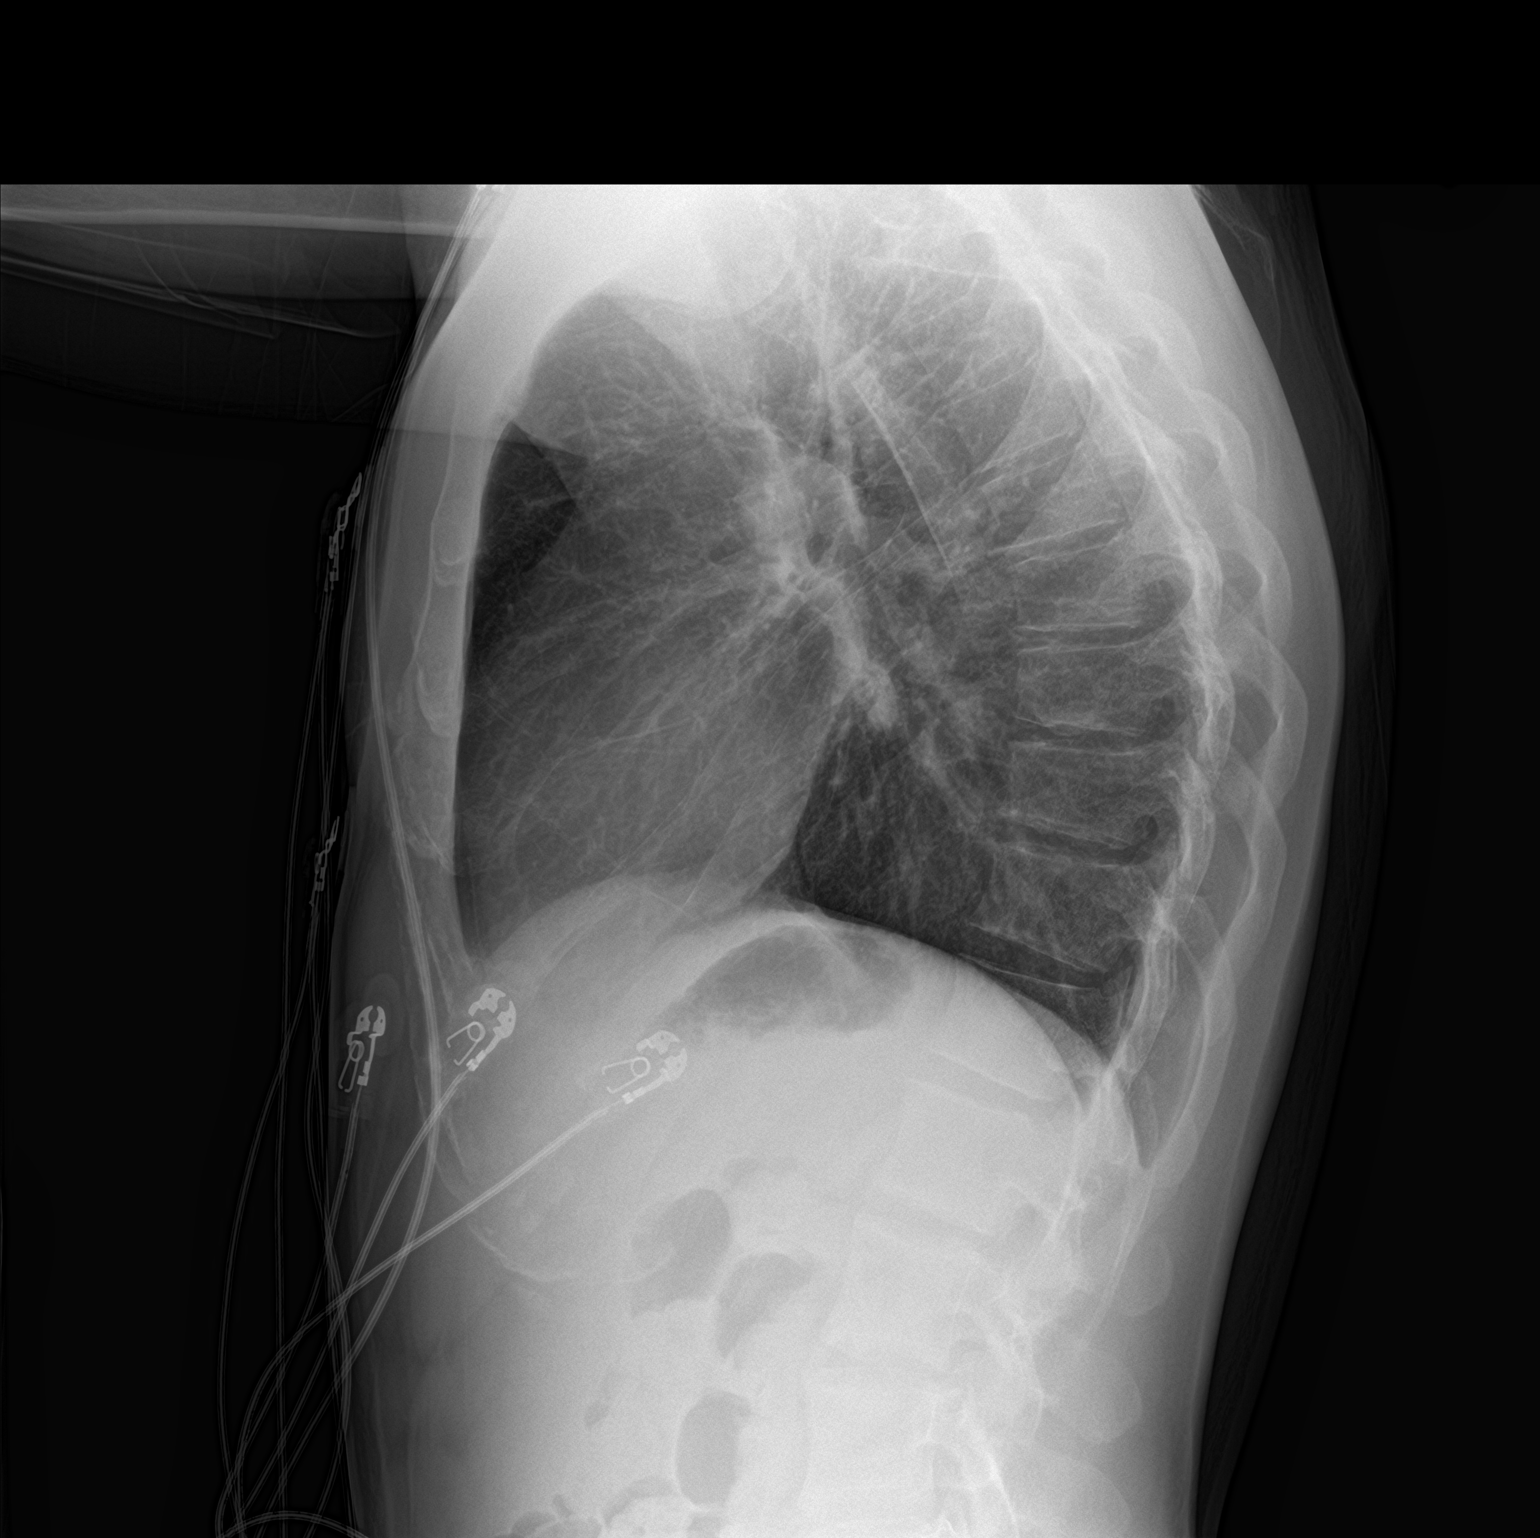

[2 of 2 positions shown; findings below may reference images not displayed]

FINDINGS: The heart size and mediastinal contours are within normal limits.
Both lungs are clear. The visualized skeletal structures are
unremarkable.
IMPRESSION: No active cardiopulmonary disease.

## 2019-03-24 ENCOUNTER — Emergency Department (HOSPITAL_COMMUNITY)
Admission: EM | Admit: 2019-03-24 | Discharge: 2019-03-24 | Disposition: A | Discharge status: Home / Self Care | Payer: Medicare Other | Attending: Emergency Medicine | Admitting: Emergency Medicine

## 2019-03-24 ENCOUNTER — Other Ambulatory Visit: Payer: Self-pay

## 2019-03-24 DIAGNOSIS — Z5321 Procedure and treatment not carried out due to patient leaving prior to being seen by health care provider: Secondary | ICD-10-CM | POA: Diagnosis not present

## 2019-03-24 DIAGNOSIS — R079 Chest pain, unspecified: Secondary | ICD-10-CM | POA: Diagnosis not present

## 2019-03-24 LAB — BASIC METABOLIC PANEL
Anion gap: 15 (ref 5–15)
BUN: 18 mg/dL (ref 6–20)
CO2: 19 mmol/L — ABNORMAL LOW (ref 22–32)
Calcium: 9.7 mg/dL (ref 8.9–10.3)
Chloride: 105 mmol/L (ref 98–111)
Creatinine, Ser: 1.06 mg/dL (ref 0.61–1.24)
GFR calc Af Amer: >60 mL/min (ref >60)
GFR calc non Af Amer: >60 mL/min (ref >60)
Glucose, Bld: 108 mg/dL — ABNORMAL HIGH (ref 70–99)
Potassium: 3.3 mmol/L — ABNORMAL LOW (ref 3.5–5.1)
Sodium: 139 mmol/L (ref 135–145)

## 2019-03-24 LAB — CBC
HCT: 47.8 % (ref 39.0–52.0)
Hemoglobin: 16.8 g/dL (ref 13.0–17.0)
MCH: 29.7 pg (ref 26.0–34.0)
MCHC: 35.1 g/dL (ref 30.0–36.0)
MCV: 84.5 fL (ref 80.0–100.0)
Platelets: 272 10*3/uL (ref 150–400)
RBC: 5.66 MIL/uL (ref 4.22–5.81)
RDW: 13.1 % (ref 11.5–15.5)
WBC: 7 10*3/uL (ref 4.0–10.5)
nRBC: 0 % (ref 0.0–0.2)

## 2019-03-24 LAB — TROPONIN I (HIGH SENSITIVITY): Troponin I (High Sensitivity): 4 ng/L (ref <18)

## 2019-03-24 MED ORDER — SODIUM CHLORIDE 0.9% FLUSH: 3.0000 mL | Freq: Once | INTRAVENOUS | Status: DC

## 2019-03-24 NOTE — ED Notes (Signed)
Called pt for vitals, no answer x1 

## 2019-03-24 NOTE — ED Notes (Signed)
Pt keeps going in and outside. Pt was called for XRAY AND DID NOT ANSWER.

## 2019-03-24 NOTE — ED Triage Notes (Signed)
Pt to ED with c/o central chest pain x's 2 days.  Also c/o vomiting, onset after eating Brendolyn Patty.  Pt st's he has GERD

## 2019-03-24 NOTE — ED Notes (Signed)
Called pt x3 for blood work, no response.

## 2019-06-04 ENCOUNTER — Ambulatory Visit (HOSPITAL_COMMUNITY)
Admission: EM | Admit: 2019-06-04 | Discharge: 2019-06-04 | Disposition: A | Discharge status: Home / Self Care | Payer: Medicare Other | Attending: Urgent Care | Admitting: Urgent Care

## 2019-06-04 ENCOUNTER — Ambulatory Visit (INDEPENDENT_AMBULATORY_CARE_PROVIDER_SITE_OTHER): Payer: Medicare Other

## 2019-06-04 ENCOUNTER — Encounter (HOSPITAL_COMMUNITY): Payer: Self-pay

## 2019-06-04 ENCOUNTER — Other Ambulatory Visit: Payer: Self-pay

## 2019-06-04 DIAGNOSIS — Z79899 Other long term (current) drug therapy: Secondary | ICD-10-CM | POA: Diagnosis not present

## 2019-06-04 DIAGNOSIS — R0989 Other specified symptoms and signs involving the circulatory and respiratory systems: Secondary | ICD-10-CM

## 2019-06-04 DIAGNOSIS — R059 Cough, unspecified: Secondary | ICD-10-CM

## 2019-06-04 DIAGNOSIS — J45909 Unspecified asthma, uncomplicated: Secondary | ICD-10-CM | POA: Diagnosis not present

## 2019-06-04 DIAGNOSIS — Z7951 Long term (current) use of inhaled steroids: Secondary | ICD-10-CM | POA: Insufficient documentation

## 2019-06-04 DIAGNOSIS — R079 Chest pain, unspecified: Secondary | ICD-10-CM

## 2019-06-04 DIAGNOSIS — R05 Cough: Secondary | ICD-10-CM | POA: Insufficient documentation

## 2019-06-04 DIAGNOSIS — R0789 Other chest pain: Secondary | ICD-10-CM

## 2019-06-04 DIAGNOSIS — Z20828 Contact with and (suspected) exposure to other viral communicable diseases: Secondary | ICD-10-CM | POA: Insufficient documentation

## 2019-06-04 DIAGNOSIS — Z720 Tobacco use: Secondary | ICD-10-CM | POA: Insufficient documentation

## 2019-06-04 DIAGNOSIS — F419 Anxiety disorder, unspecified: Secondary | ICD-10-CM | POA: Insufficient documentation

## 2019-06-04 MED ORDER — PROMETHAZINE-DM 6.25-15 MG/5ML PO SYRP: 5.0000 mL | ORAL_SOLUTION | Freq: Every evening | ORAL | 0 refills | Status: DC | PRN

## 2019-06-04 MED ORDER — NAPROXEN 500 MG PO TABS: 500.0000 mg | ORAL_TABLET | Freq: Two times a day (BID) | ORAL | 0 refills | Status: DC

## 2019-06-04 MED ORDER — BENZONATATE 100 MG PO CAPS: 100.0000 mg | ORAL_CAPSULE | Freq: Three times a day (TID) | ORAL | 0 refills | Status: DC | PRN

## 2019-06-04 MED ORDER — CYCLOBENZAPRINE HCL 5 MG PO TABS: 5.0000 mg | ORAL_TABLET | Freq: Three times a day (TID) | ORAL | 0 refills | Status: DC | PRN

## 2019-06-04 NOTE — ED Provider Notes (Signed)
MRN: [id number] DOB: Oct 10, 1985  Subjective:   Kenneth Lane is a 33 y.o. male presenting for 2 day hx of left sided lateral chest pain that is intermittent, aching. Patient was lifting heavy items for his work. Has tried icing with temporary relief only. Has also had cough, runny nose, throat pain (now improved). Those sx started when he was out working in the rain about a week ago. Smokes 1/2ppd.   No current facility-administered medications for this encounter.   Current Outpatient Medications:  .  albuterol (PROVENTIL HFA;VENTOLIN HFA) 108 (90 Base) MCG/ACT inhaler, Inhale 2 puffs into the lungs every 6 (six) hours as needed for wheezing or shortness of breath., Disp: 1 Inhaler, Rfl: 2 .  escitalopram (LEXAPRO) 10 MG tablet, Take 1 tablet (10 mg total) by mouth daily., Disp: 30 tablet, Rfl: 1 .  fluticasone (FLOVENT HFA) 110 MCG/ACT inhaler, Take 2 puffs twice daily for 1-2 weeks when in your yellow zone., Disp: 1 Inhaler, Rfl: 12 .  hydrOXYzine (ATARAX/VISTARIL) 25 MG tablet, Take 1 tablet (25 mg total) by mouth every 8 (eight) hours as needed for anxiety., Disp: 12 tablet, Rfl: 0    Allergies  Allergen Reactions  . Shellfish-Derived Products Anaphylaxis, Swelling and Rash    Past Medical History:  Diagnosis Date  . Anxiety   . Asthma   . Bradycardia   . Seasonal allergies      Past Surgical History:  Procedure Laterality Date  . DENTAL SURGERY      Review of Systems  Constitutional: Negative for fever and malaise/fatigue.  HENT: Positive for congestion and sore throat. Negative for ear pain and sinus pain.   Eyes: Negative for discharge and redness.  Respiratory: Positive for cough. Negative for hemoptysis, shortness of breath and wheezing.   Cardiovascular: Positive for chest pain.  Gastrointestinal: Negative for abdominal pain, diarrhea, nausea and vomiting.  Genitourinary: Negative for dysuria, flank pain and hematuria.  Musculoskeletal: Negative for myalgias.   Skin: Negative for rash.  Neurological: Negative for dizziness, weakness and headaches.  Psychiatric/Behavioral: Negative for depression and substance abuse.    Objective:   Vitals: BP 118/80 (BP Location: Right Arm)   Pulse 78   Temp 98.6 F (37 C) (Oral)   Resp 17   SpO2 99%   Physical Exam Constitutional:      General: He is not in acute distress.    Appearance: Normal appearance. He is well-developed. He is not ill-appearing, toxic-appearing or diaphoretic.  HENT:     Head: Normocephalic and atraumatic.     Right Ear: External ear normal.     Left Ear: External ear normal.     Nose: Congestion and rhinorrhea present.     Mouth/Throat:     Mouth: Mucous membranes are moist.  Eyes:     General: No scleral icterus.       Right eye: No discharge.        Left eye: No discharge.     Extraocular Movements: Extraocular movements intact.     Pupils: Pupils are equal, round, and reactive to light.  Cardiovascular:     Rate and Rhythm: Normal rate and regular rhythm.     Heart sounds: Normal heart sounds. No murmur. No friction rub. No gallop.   Pulmonary:     Effort: Pulmonary effort is normal. No respiratory distress.     Breath sounds: No stridor. Rhonchi (right mid-lower lung fields) present. No wheezing or rales.  Chest:    Neurological:  Mental Status: He is alert and oriented to person, place, and time.  Psychiatric:        Mood and Affect: Mood normal.        Behavior: Behavior normal.        Thought Content: Thought content normal.        Judgment: Judgment normal.     Dg Chest 2 View  Result Date: 06/04/2019 CLINICAL DATA:  Patient with chest pain. EXAM: CHEST - 2 VIEW COMPARISON:  Chest radiograph 02/01/2019 FINDINGS: The heart size and mediastinal contours are within normal limits. Both lungs are clear. The visualized skeletal structures are unremarkable. IMPRESSION: No active cardiopulmonary disease. Electronically Signed   By: Annia Belt M.D.   On:  06/04/2019 15:07     Assessment and Plan :   1. Left-sided chest pain   2. Cough   3. Runny nose   4. Chest wall pain   5. Tobacco use     Will use supportive care for his respiratory symptoms, recommended he stop smoking.  Patient is to start naproxen and Flexeril, modify his physical activities for management of his chest wall pain/strain.  COVID-19 testing is pending. Counseled patient on potential for adverse effects with medications prescribed/recommended today, ER and return-to-clinic precautions discussed, patient verbalized understanding.    Wallis Bamberg, PA-C 06/04/19 1520

## 2019-06-04 NOTE — ED Triage Notes (Signed)
Pt presents with left side rib pain X 2 days from unknown source.

## 2019-06-05 LAB — SARS CORONAVIRUS 2 (TAT 6-24 HRS): SARS Coronavirus 2: NEGATIVE

## 2019-07-18 ENCOUNTER — Ambulatory Visit (HOSPITAL_COMMUNITY)
Admission: EM | Admit: 2019-07-18 | Discharge: 2019-07-18 | Disposition: A | Discharge status: Home / Self Care | Payer: Medicare Other | Attending: Family Medicine | Admitting: Family Medicine

## 2019-07-18 ENCOUNTER — Encounter (HOSPITAL_COMMUNITY): Payer: Self-pay

## 2019-07-18 DIAGNOSIS — R300 Dysuria: Secondary | ICD-10-CM | POA: Diagnosis not present

## 2019-07-18 DIAGNOSIS — Z202 Contact with and (suspected) exposure to infections with a predominantly sexual mode of transmission: Secondary | ICD-10-CM | POA: Diagnosis present

## 2019-07-18 LAB — POCT URINALYSIS DIP (DEVICE)
Bilirubin Urine: NEGATIVE
Glucose, UA: NEGATIVE mg/dL
Hgb urine dipstick: NEGATIVE
Ketones, ur: NEGATIVE mg/dL
Leukocytes,Ua: NEGATIVE
Nitrite: NEGATIVE
Protein, ur: NEGATIVE mg/dL
Specific Gravity, Urine: 1.025 (ref 1.005–1.030)
Urobilinogen, UA: 0.2 mg/dL (ref 0.0–1.0)
pH: 7 (ref 5.0–8.0)

## 2019-07-18 MED ORDER — CEFTRIAXONE SODIUM 250 MG IJ SOLR
INTRAMUSCULAR | Status: AC
  Filled 2019-07-18: qty 250

## 2019-07-18 MED ORDER — AZITHROMYCIN 250 MG PO TABS
ORAL_TABLET | ORAL | Status: AC
  Filled 2019-07-18: qty 4

## 2019-07-18 MED ORDER — AZITHROMYCIN 250 MG PO TABS
1000.0000 mg | ORAL_TABLET | Freq: Once | ORAL | Status: AC
  Administered 2019-07-18: 19:00:00 1000 mg via ORAL

## 2019-07-18 MED ORDER — CEFTRIAXONE SODIUM 250 MG IJ SOLR
250.0000 mg | Freq: Once | INTRAMUSCULAR | Status: AC
  Administered 2019-07-18: 19:00:00 250 mg via INTRAMUSCULAR

## 2019-07-18 NOTE — ED Provider Notes (Signed)
[facility]    CSN: [id number] Arrival date & time: 07/18/19  1633      History   Chief Complaint Chief Complaint  Patient presents with  . STD test  . Dysuria    HPI Kenneth Lane is a 33 y.o. male.   HPI  Patient has had unprotected sexual relations.  He has dysuria.  He wants to be treated for "everything".  Denies need for blood testing for HIV and RPR, against my advice.  Past Medical History:  Diagnosis Date  . Anxiety   . Asthma   . Bradycardia   . Seasonal allergies     There are no problems to display for this patient.   Past Surgical History:  Procedure Laterality Date  . DENTAL SURGERY         Home Medications    Prior to Admission medications   Medication Sig Start Date End Date Taking? Authorizing Provider  albuterol (PROVENTIL HFA;VENTOLIN HFA) 108 (90 Base) MCG/ACT inhaler Inhale 2 puffs into the lungs every 6 (six) hours as needed for wheezing or shortness of breath. 11/01/18   Sharlene Dory, DO  escitalopram (LEXAPRO) 10 MG tablet Take 1 tablet (10 mg total) by mouth daily. 02/27/19 07/18/19  Dahlia Byes A, NP  fluticasone (FLOVENT HFA) 110 MCG/ACT inhaler Take 2 puffs twice daily for 1-2 weeks when in your yellow zone. 11/01/18 07/18/19  Sharlene Dory, DO  levocetirizine (XYZAL) 5 MG tablet Take 1 tablet (5 mg total) by mouth every evening. 11/01/18 02/27/19  Sharlene Dory, DO  sucralfate (CARAFATE) 1 G tablet Take 1 tablet (1 g total) by mouth 4 (four) times daily. 06/08/11 10/20/11  Eber Hong, MD    Family History Family History  Problem Relation Age of Onset  . Cancer Other   . Hypertension Other     Social History Social History   Tobacco Use  . Smoking status: Current Every Day Smoker    Packs/day: 1.00    Types: Cigarettes  . Smokeless tobacco: Never Used  Substance Use Topics  . Alcohol use: Yes  . Drug use: Yes    Types: Marijuana     Allergies   Shellfish-derived products and  Orange concentrate [flavoring agent]   Review of Systems Review of Systems  Constitutional: Negative for chills and fever.  HENT: Negative for congestion and hearing loss.   Eyes: Negative for pain.  Respiratory: Negative for cough and shortness of breath.   Cardiovascular: Negative for chest pain and leg swelling.  Gastrointestinal: Negative for abdominal pain, constipation and diarrhea.  Genitourinary: Positive for dysuria. Negative for discharge and frequency.  Musculoskeletal: Negative for myalgias.  Neurological: Negative for dizziness, seizures and headaches.  Psychiatric/Behavioral: The patient is not nervous/anxious.      Physical Exam Triage Vital Signs ED Triage Vitals  Enc Vitals Group     BP 07/18/19 1800 122/76     Pulse Rate 07/18/19 1800 72     Resp 07/18/19 1800 14     Temp 07/18/19 1800 98.5 F (36.9 C)     Temp src --      SpO2 07/18/19 1800 100 %     Weight --      Height --      Head Circumference --      Peak Flow --      Pain Score 07/18/19 1757 0     Pain Loc --      Pain Edu? --  Excl. in GC? --    No data found.  Updated Vital Signs BP 122/76 (BP Location: Right Arm)   Pulse 72   Temp 98.5 F (36.9 C)   Resp 14   SpO2 100%   Visual Acuity Right Eye Distance:   Left Eye Distance:   Bilateral Distance:    Right Eye Near:   Left Eye Near:    Bilateral Near:     Physical Exam Constitutional:      General: He is not in acute distress.    Appearance: He is well-developed.  HENT:     Head: Normocephalic and atraumatic.  Eyes:     Conjunctiva/sclera: Conjunctivae normal.     Pupils: Pupils are equal, round, and reactive to light.  Cardiovascular:     Rate and Rhythm: Normal rate.  Pulmonary:     Effort: Pulmonary effort is normal. No respiratory distress.  Abdominal:     General: There is no distension.     Palpations: Abdomen is soft.  Genitourinary:    Comments: Normal circumcised penis.  Swab was sent Musculoskeletal:         General: Normal range of motion.     Cervical back: Normal range of motion.  Skin:    General: Skin is warm and dry.  Neurological:     Mental Status: He is alert.  Psychiatric:        Mood and Affect: Mood normal.        Behavior: Behavior normal.      UC Treatments / Results  Labs (all labs ordered are listed, but only abnormal results are displayed) Labs Reviewed  POCT URINALYSIS DIP (DEVICE)  CYTOLOGY, (ORAL, ANAL, URETHRAL) ANCILLARY ONLY    EKG   Radiology No results found.  Procedures Procedures (including critical care time)  Medications Ordered in UC Medications  azithromycin (ZITHROMAX) tablet 1,000 mg (1,000 mg Oral Given 07/18/19 1845)  cefTRIAXone (ROCEPHIN) injection 250 mg (250 mg Intramuscular Given 07/18/19 1846)    Initial Impression / Assessment and Plan / UC Course  I have reviewed the triage vital signs and the nursing notes.  Pertinent labs & imaging results that were available during my care of the patient were reviewed by me and considered in my medical decision making (see chart for details).     Safe sex is recommended Final Clinical Impressions(s) / UC Diagnoses   Final diagnoses:  Dysuria  Possible exposure to STD     Discharge Instructions     The urine test is negative The swab test results will be available in a couple of days You will be called if any of your test results are positive You have been treated for gonorrhea and chlamydia Avoid sexual relations for 7 days   ED Prescriptions    None     PDMP not reviewed this encounter.   Raylene Everts, MD 07/18/19 2001

## 2019-07-18 NOTE — Discharge Instructions (Addendum)
The urine test is negative The swab test results will be available in a couple of days You will be called if any of your test results are positive You have been treated for gonorrhea and chlamydia Avoid sexual relations for 7 days

## 2019-07-18 NOTE — ED Triage Notes (Addendum)
Pt presents to the UC for STD test. Pt states having penile tingling sensation and mild burning when urinating  x 3 days. Pt denies any penile discharge or pain.

## 2019-07-19 ENCOUNTER — Telehealth: Payer: Self-pay | Admitting: Emergency Medicine

## 2019-07-19 LAB — CYTOLOGY, (ORAL, ANAL, URETHRAL) ANCILLARY ONLY
Chlamydia: POSITIVE — AB
Neisseria Gonorrhea: NEGATIVE
Trichomonas: NEGATIVE

## 2019-07-19 NOTE — Telephone Encounter (Signed)
Chlamydia is positive.  This was treated at the urgent care visit with po zithromax 1g.  Pt needs education to please refrain from sexual intercourse for 7 days to give the medicine time to work.  Sexual partners need to be notified and tested/treated.  Condoms may reduce risk of reinfection.  Recheck or followup with PCP for further evaluation if symptoms are not improving.  [facility] notified.  Patient contacted by phone and made aware of    results. Pt verbalized understanding and had all questions answered.    

## 2019-07-22 ENCOUNTER — Encounter (HOSPITAL_COMMUNITY): Payer: Self-pay | Admitting: Emergency Medicine

## 2019-07-22 ENCOUNTER — Emergency Department (HOSPITAL_COMMUNITY): Payer: Medicare Other

## 2019-07-22 ENCOUNTER — Encounter (HOSPITAL_COMMUNITY): Payer: Self-pay

## 2019-07-22 ENCOUNTER — Inpatient Hospital Stay (HOSPITAL_COMMUNITY)
Admission: EM | Admit: 2019-07-22 | Discharge: 2019-07-26 | DRG: 203 | Disposition: A | Discharge status: Home / Self Care | Payer: Medicare Other | Attending: Internal Medicine | Admitting: Internal Medicine

## 2019-07-22 ENCOUNTER — Emergency Department (HOSPITAL_COMMUNITY)
Admission: EM | Admit: 2019-07-22 | Discharge: 2019-07-22 | Discharge status: Against Medical Advice | Payer: Medicare Other | Source: Home / Self Care

## 2019-07-22 ENCOUNTER — Other Ambulatory Visit: Payer: Self-pay

## 2019-07-22 DIAGNOSIS — J4541 Moderate persistent asthma with (acute) exacerbation: Secondary | ICD-10-CM | POA: Diagnosis not present

## 2019-07-22 DIAGNOSIS — J45901 Unspecified asthma with (acute) exacerbation: Secondary | ICD-10-CM | POA: Diagnosis not present

## 2019-07-22 DIAGNOSIS — Z91018 Allergy to other foods: Secondary | ICD-10-CM

## 2019-07-22 DIAGNOSIS — K219 Gastro-esophageal reflux disease without esophagitis: Secondary | ICD-10-CM | POA: Diagnosis present

## 2019-07-22 DIAGNOSIS — Z91013 Allergy to seafood: Secondary | ICD-10-CM

## 2019-07-22 DIAGNOSIS — Z87891 Personal history of nicotine dependence: Secondary | ICD-10-CM

## 2019-07-22 DIAGNOSIS — Z79899 Other long term (current) drug therapy: Secondary | ICD-10-CM

## 2019-07-22 DIAGNOSIS — J029 Acute pharyngitis, unspecified: Secondary | ICD-10-CM | POA: Diagnosis not present

## 2019-07-22 DIAGNOSIS — R1110 Vomiting, unspecified: Secondary | ICD-10-CM

## 2019-07-22 DIAGNOSIS — F419 Anxiety disorder, unspecified: Secondary | ICD-10-CM | POA: Diagnosis present

## 2019-07-22 DIAGNOSIS — R0602 Shortness of breath: Secondary | ICD-10-CM | POA: Diagnosis not present

## 2019-07-22 DIAGNOSIS — Z20828 Contact with and (suspected) exposure to other viral communicable diseases: Secondary | ICD-10-CM | POA: Diagnosis not present

## 2019-07-22 DIAGNOSIS — Z5321 Procedure and treatment not carried out due to patient leaving prior to being seen by health care provider: Secondary | ICD-10-CM | POA: Insufficient documentation

## 2019-07-22 DIAGNOSIS — Z8249 Family history of ischemic heart disease and other diseases of the circulatory system: Secondary | ICD-10-CM

## 2019-07-22 LAB — GROUP A STREP BY PCR: Group A Strep by PCR: NOT DETECTED

## 2019-07-22 MED ORDER — ALBUTEROL SULFATE HFA 108 (90 BASE) MCG/ACT IN AERS: 6.0000 | INHALATION_SPRAY | Freq: Once | RESPIRATORY_TRACT | Status: DC

## 2019-07-22 MED ORDER — ALBUTEROL SULFATE HFA 108 (90 BASE) MCG/ACT IN AERS
6.0000 | INHALATION_SPRAY | Freq: Once | RESPIRATORY_TRACT | Status: AC
  Administered 2019-07-22: 6 via RESPIRATORY_TRACT
  Filled 2019-07-22: qty 6.7

## 2019-07-22 MED ORDER — DEXAMETHASONE SODIUM PHOSPHATE 10 MG/ML IJ SOLN
10.0000 mg | Freq: Once | INTRAMUSCULAR | Status: AC
  Administered 2019-07-22: 10 mg via INTRAMUSCULAR
  Filled 2019-07-22: qty 1

## 2019-07-22 MED ORDER — ALBUTEROL SULFATE HFA 108 (90 BASE) MCG/ACT IN AERS
6.0000 | INHALATION_SPRAY | Freq: Once | RESPIRATORY_TRACT | Status: AC
  Administered 2019-07-22: 6 via RESPIRATORY_TRACT

## 2019-07-22 MED ORDER — ALBUTEROL SULFATE HFA 108 (90 BASE) MCG/ACT IN AERS
8.0000 | INHALATION_SPRAY | Freq: Once | RESPIRATORY_TRACT | Status: AC
  Administered 2019-07-22: 8 via RESPIRATORY_TRACT
  Filled 2019-07-22: qty 6.7

## 2019-07-22 NOTE — ED Provider Notes (Signed)
[name] Provider Note   CSN: [id number] Arrival date & time: 07/22/19  2025     History Chief Complaint  Patient presents with  . Asthma  . Sore Throat  . Shortness of Breath    Kenneth Lane is a [age] y.o. male.  Patient with history of asthma presents with cough congestion sore throat and wheezing for the past several days.  Patient states this feels similar to previous asthma exacerbation.  Possible contact with someone who was exposed to Covid.  No fevers or chills.  No other significant medical history.        Past Medical History:  Diagnosis Date  . Anxiety   . Asthma   . Bradycardia   . Seasonal allergies     There are no problems to display for this patient.   Past Surgical History:  Procedure Laterality Date  . DENTAL SURGERY         Family History  Problem Relation Age of Onset  . Cancer Other   . Hypertension Other     Social History   Tobacco Use  . Smoking status: Current Every Day Smoker    Packs/day: 1.00    Types: Cigarettes  . Smokeless tobacco: Never Used  Substance Use Topics  . Alcohol use: Yes  . Drug use: Yes    Types: Marijuana    Home Medications Prior to Admission medications   Medication Sig Start Date End Date Taking? Authorizing Provider  albuterol (PROVENTIL HFA;VENTOLIN HFA) 108 (90 Base) MCG/ACT inhaler Inhale 2 puffs into the lungs every 6 (six) hours as needed for wheezing or shortness of breath. 11/01/18   Sharlene DoryWendling, Nicholas Paul, DO  escitalopram (LEXAPRO) 10 MG tablet Take 1 tablet (10 mg total) by mouth daily. 02/27/19 07/18/19  Dahlia ByesBast, Traci A, NP  fluticasone (FLOVENT HFA) 110 MCG/ACT inhaler Take 2 puffs twice daily for 1-2 weeks when in your yellow zone. 11/01/18 07/18/19  Sharlene DoryWendling, Nicholas Paul, DO  levocetirizine (XYZAL) 5 MG tablet Take 1 tablet (5 mg total) by mouth every evening. 11/01/18 02/27/19  Sharlene DoryWendling, Nicholas Paul, DO  sucralfate (CARAFATE) 1 G tablet Take 1  tablet (1 g total) by mouth 4 (four) times daily. 06/08/11 10/20/11  Eber HongMiller, Brian, MD    Allergies    Shellfish-derived products and Orange concentrate [flavoring agent]  Review of Systems   Review of Systems  Constitutional: Negative for chills and fever.  HENT: Negative for congestion.   Eyes: Negative for visual disturbance.  Respiratory: Positive for cough, shortness of breath and wheezing.   Cardiovascular: Negative for chest pain.  Gastrointestinal: Negative for abdominal pain and vomiting.  Genitourinary: Negative for dysuria and flank pain.  Musculoskeletal: Negative for back pain, neck pain and neck stiffness.  Skin: Negative for rash.  Neurological: Negative for light-headedness and headaches.    Physical Exam Updated Vital Signs BP 137/84 (BP Location: Right Arm)   Pulse 65   Temp 98.4 F (36.9 C) (Oral)   Resp (!) 28   SpO2 100%   Physical Exam Vitals and nursing note reviewed.  Constitutional:      Appearance: He is well-developed.  HENT:     Head: Normocephalic and atraumatic.     Comments: No trismus, uvular deviation, unilateral posterior pharyngeal edema or submandibular swelling. Minimal erythema posterior pharynx. Eyes:     General:        Right eye: No discharge.        Left eye: No discharge.  Conjunctiva/sclera: Conjunctivae normal.  Neck:     Trachea: No tracheal deviation.  Cardiovascular:     Rate and Rhythm: Normal rate and regular rhythm.  Pulmonary:     Effort: Pulmonary effort is normal.     Breath sounds: Wheezing (bilateral) present.  Abdominal:     General: There is no distension.     Palpations: Abdomen is soft.     Tenderness: There is no abdominal tenderness. There is no guarding.  Musculoskeletal:     Cervical back: Normal range of motion and neck supple.  Skin:    General: Skin is warm.     Findings: No rash.  Neurological:     Mental Status: He is alert and oriented to person, place, and time.     ED Results /  Procedures / Treatments   Labs (all labs ordered are listed, but only abnormal results are displayed) Labs Reviewed  GROUP A STREP BY PCR  SARS CORONAVIRUS 2 (TAT 6-24 HRS)    EKG EKG Interpretation  Date/Time:  Saturday July 22 2019 21:08:46 EST Ventricular Rate:  71 PR Interval:  128 QRS Duration: 94 QT Interval:  362 QTC Calculation: 393 R Axis:   53 Text Interpretation: Normal sinus rhythm Normal ECG Confirmed by Blane Ohara [phone]) on 07/22/2019 9:41:55 PM   Radiology No results found.  Procedures Procedures (including critical care time)  Medications Ordered in ED Medications  dexamethasone (DECADRON) injection 10 mg (has no administration in time range)  albuterol (VENTOLIN HFA) 108 (90 Base) MCG/ACT inhaler 6 puff (has no administration in time range)  albuterol (VENTOLIN HFA) 108 (90 Base) MCG/ACT inhaler 8 puff (8 puffs Inhalation Given 07/22/19 2112)    ED Course  I have reviewed the triage vital signs and the nursing notes.  Pertinent labs & imaging results that were available during my care of the patient were reviewed by me and considered in my medical decision making (see chart for details).    MDM Rules/Calculators/A&P                      Patient presents with clinically acute asthma exacerbation.  Patient received 6 albuterol puffs, still wheezing.  Plan for repeat of 6 albuterol puffs.  Steroids ordered.  Chest x-ray pending.  EKG reviewed no acute findings.  Patient received 3 rounds of 6 puffs from the inhaler and no significant improvement on multiple reassessments.  Fortunately patient not requiring oxygen.  Patient was tachypneic intermittently in the ER.  Not requiring oxygen with ambulation however increased work of breathing.  Discussed with hospitalist for observation.  Rapid Covid/flu test pending. Strep test negative, blood work pending.  Kenneth Lane was evaluated in Emergency Department on 07/22/2019 for the symptoms  described in the history of present illness. He was evaluated in the context of the global COVID-19 pandemic, which necessitated consideration that the patient might be at risk for infection with the SARS-CoV-2 virus that causes COVID-19. Institutional protocols and algorithms that pertain to the evaluation of patients at risk for COVID-19 are in a state of rapid change based on information released by regulatory bodies including the CDC and federal and state organizations. These policies and algorithms were followed during the patient's care in the ED.  Final Clinical Impression(s) / ED Diagnoses Final diagnoses:  Moderate persistent asthma with acute exacerbation  Acute pharyngitis, unspecified etiology    Rx / DC Orders ED Discharge Orders    None  Elnora Morrison, MD 07/23/19 [phone]

## 2019-07-22 NOTE — Discharge Instructions (Addendum)
Continue use albuterol every 2-3 hours as needed and gradually lengthen intervals as you improve.  If you develop worsening shortness of breath return the emergency room.  Use steroids as directed starting tomorrow.

## 2019-07-22 NOTE — ED Triage Notes (Signed)
Pt states that he has been having SOB, cough and sore throat for several days, came earlier but LWBS, possible sick contacts

## 2019-07-22 NOTE — ED Triage Notes (Signed)
C/o cough and SOB x 3 days.

## 2019-07-22 NOTE — ED Notes (Signed)
Portable xray at bedside.

## 2019-07-22 NOTE — ED Notes (Signed)
Pt oxygen saturation stayed at 98% while ambulating

## 2019-07-23 DIAGNOSIS — R0602 Shortness of breath: Secondary | ICD-10-CM | POA: Diagnosis not present

## 2019-07-23 DIAGNOSIS — R1110 Vomiting, unspecified: Secondary | ICD-10-CM

## 2019-07-23 DIAGNOSIS — Z20828 Contact with and (suspected) exposure to other viral communicable diseases: Secondary | ICD-10-CM | POA: Diagnosis not present

## 2019-07-23 DIAGNOSIS — Z87891 Personal history of nicotine dependence: Secondary | ICD-10-CM | POA: Diagnosis not present

## 2019-07-23 DIAGNOSIS — R9431 Abnormal electrocardiogram [ECG] [EKG]: Secondary | ICD-10-CM | POA: Diagnosis not present

## 2019-07-23 DIAGNOSIS — J029 Acute pharyngitis, unspecified: Secondary | ICD-10-CM | POA: Diagnosis not present

## 2019-07-23 DIAGNOSIS — J45901 Unspecified asthma with (acute) exacerbation: Secondary | ICD-10-CM | POA: Diagnosis present

## 2019-07-23 DIAGNOSIS — F419 Anxiety disorder, unspecified: Secondary | ICD-10-CM | POA: Diagnosis present

## 2019-07-23 DIAGNOSIS — Z79899 Other long term (current) drug therapy: Secondary | ICD-10-CM | POA: Diagnosis not present

## 2019-07-23 DIAGNOSIS — Z91018 Allergy to other foods: Secondary | ICD-10-CM | POA: Diagnosis not present

## 2019-07-23 DIAGNOSIS — J4541 Moderate persistent asthma with (acute) exacerbation: Secondary | ICD-10-CM | POA: Diagnosis not present

## 2019-07-23 DIAGNOSIS — Z8249 Family history of ischemic heart disease and other diseases of the circulatory system: Secondary | ICD-10-CM | POA: Diagnosis not present

## 2019-07-23 DIAGNOSIS — R112 Nausea with vomiting, unspecified: Secondary | ICD-10-CM

## 2019-07-23 DIAGNOSIS — K219 Gastro-esophageal reflux disease without esophagitis: Secondary | ICD-10-CM | POA: Diagnosis present

## 2019-07-23 DIAGNOSIS — Z91013 Allergy to seafood: Secondary | ICD-10-CM | POA: Diagnosis not present

## 2019-07-23 LAB — RESPIRATORY PANEL BY RT PCR (FLU A&B, COVID)
Influenza A by PCR: NEGATIVE
Influenza B by PCR: NEGATIVE
SARS Coronavirus 2 by RT PCR: NEGATIVE

## 2019-07-23 LAB — HEPATIC FUNCTION PANEL
ALT: 20 U/L (ref 0–44)
AST: 30 U/L (ref 15–41)
Albumin: 4.1 g/dL (ref 3.5–5.0)
Alkaline Phosphatase: 69 U/L (ref 38–126)
Bilirubin, Direct: 0.2 mg/dL (ref 0.0–0.2)
Indirect Bilirubin: 0.1 mg/dL — ABNORMAL LOW (ref 0.3–0.9)
Total Bilirubin: 0.3 mg/dL (ref 0.3–1.2)
Total Protein: 6.7 g/dL (ref 6.5–8.1)

## 2019-07-23 LAB — CBC WITH DIFFERENTIAL/PLATELET
Abs Immature Granulocytes: 0.04 10*3/uL (ref 0.00–0.07)
Basophils Absolute: 0 10*3/uL (ref 0.0–0.1)
Basophils Relative: 0 %
Eosinophils Absolute: 0 10*3/uL (ref 0.0–0.5)
Eosinophils Relative: 0 %
HCT: 44.8 % (ref 39.0–52.0)
Hemoglobin: 15.6 g/dL (ref 13.0–17.0)
Immature Granulocytes: 0 %
Lymphocytes Relative: 5 %
Lymphs Abs: 0.5 10*3/uL — ABNORMAL LOW (ref 0.7–4.0)
MCH: 29.9 pg (ref 26.0–34.0)
MCHC: 34.8 g/dL (ref 30.0–36.0)
MCV: 86 fL (ref 80.0–100.0)
Monocytes Absolute: 0.2 10*3/uL (ref 0.1–1.0)
Monocytes Relative: 2 %
Neutro Abs: 8.7 10*3/uL — ABNORMAL HIGH (ref 1.7–7.7)
Neutrophils Relative %: 93 %
Platelets: 197 10*3/uL (ref 150–400)
RBC: 5.21 MIL/uL (ref 4.22–5.81)
RDW: 13.4 % (ref 11.5–15.5)
WBC: 9.5 10*3/uL (ref 4.0–10.5)
nRBC: 0 % (ref 0.0–0.2)

## 2019-07-23 LAB — BASIC METABOLIC PANEL
Anion gap: 11 (ref 5–15)
BUN: 14 mg/dL (ref 6–20)
CO2: 23 mmol/L (ref 22–32)
Calcium: 9.4 mg/dL (ref 8.9–10.3)
Chloride: 102 mmol/L (ref 98–111)
Creatinine, Ser: 1.05 mg/dL (ref 0.61–1.24)
GFR calc Af Amer: >60 mL/min (ref >60)
GFR calc non Af Amer: >60 mL/min (ref >60)
Glucose, Bld: 164 mg/dL — ABNORMAL HIGH (ref 70–99)
Potassium: 3.4 mmol/L — ABNORMAL LOW (ref 3.5–5.1)
Sodium: 136 mmol/L (ref 135–145)

## 2019-07-23 LAB — HIV ANTIBODY (ROUTINE TESTING W REFLEX): HIV Screen 4th Generation wRfx: NONREACTIVE

## 2019-07-23 LAB — SARS CORONAVIRUS 2 (TAT 6-24 HRS): SARS Coronavirus 2: NEGATIVE

## 2019-07-23 LAB — LIPASE, BLOOD: Lipase: 20 U/L (ref 11–51)

## 2019-07-23 MED ORDER — METHYLPREDNISOLONE SODIUM SUCC 125 MG IJ SOLR
60.0000 mg | Freq: Four times a day (QID) | INTRAMUSCULAR | Status: DC
  Administered 2019-07-23 (×2): 60 mg via INTRAVENOUS
  Filled 2019-07-23 (×3): qty 2

## 2019-07-23 MED ORDER — IPRATROPIUM-ALBUTEROL 0.5-2.5 (3) MG/3ML IN SOLN
3.0000 mL | Freq: Four times a day (QID) | RESPIRATORY_TRACT | Status: DC
  Administered 2019-07-23 (×3): 3 mL via RESPIRATORY_TRACT
  Filled 2019-07-23 (×3): qty 3

## 2019-07-23 MED ORDER — ALBUTEROL SULFATE (2.5 MG/3ML) 0.083% IN NEBU
2.5000 mg | INHALATION_SOLUTION | RESPIRATORY_TRACT | Status: DC | PRN
  Administered 2019-07-24: 2.5 mg via RESPIRATORY_TRACT
  Filled 2019-07-23: qty 3

## 2019-07-23 MED ORDER — PREDNISONE 20 MG PO TABS: 40.0000 mg | ORAL_TABLET | Freq: Every day | ORAL | Status: DC

## 2019-07-23 MED ORDER — BUDESONIDE 0.25 MG/2ML IN SUSP
0.2500 mg | Freq: Two times a day (BID) | RESPIRATORY_TRACT | Status: DC
  Administered 2019-07-23 – 2019-07-26 (×7): 0.25 mg via RESPIRATORY_TRACT
  Filled 2019-07-23 (×7): qty 2

## 2019-07-23 MED ORDER — ONDANSETRON HCL 4 MG/2ML IJ SOLN
4.0000 mg | Freq: Four times a day (QID) | INTRAMUSCULAR | Status: DC | PRN
  Administered 2019-07-23: 4 mg via INTRAVENOUS
  Filled 2019-07-23: qty 2

## 2019-07-23 MED ORDER — BENZONATATE 100 MG PO CAPS
100.0000 mg | ORAL_CAPSULE | Freq: Two times a day (BID) | ORAL | Status: DC
  Administered 2019-07-23 – 2019-07-26 (×6): 100 mg via ORAL
  Filled 2019-07-23 (×6): qty 1

## 2019-07-23 MED ORDER — ALBUTEROL SULFATE HFA 108 (90 BASE) MCG/ACT IN AERS: 4.0000 | INHALATION_SPRAY | RESPIRATORY_TRACT | Status: DC | PRN

## 2019-07-23 MED ORDER — HEPARIN SODIUM (PORCINE) 5000 UNIT/ML IJ SOLN
5000.0000 [IU] | Freq: Three times a day (TID) | INTRAMUSCULAR | Status: DC
  Administered 2019-07-23 – 2019-07-26 (×8): 5000 [IU] via SUBCUTANEOUS
  Filled 2019-07-23 (×10): qty 1

## 2019-07-23 MED ORDER — IPRATROPIUM-ALBUTEROL 0.5-2.5 (3) MG/3ML IN SOLN
3.0000 mL | Freq: Three times a day (TID) | RESPIRATORY_TRACT | Status: DC
  Administered 2019-07-24: 3 mL via RESPIRATORY_TRACT
  Filled 2019-07-23: qty 3

## 2019-07-23 MED ORDER — GUAIFENESIN-DM 100-10 MG/5ML PO SYRP
5.0000 mL | ORAL_SOLUTION | ORAL | Status: DC | PRN
  Filled 2019-07-23: qty 5

## 2019-07-23 MED ORDER — POTASSIUM CHLORIDE CRYS ER 20 MEQ PO TBCR
20.0000 meq | EXTENDED_RELEASE_TABLET | Freq: Once | ORAL | Status: AC
  Administered 2019-07-23: 20 meq via ORAL
  Filled 2019-07-23: qty 1

## 2019-07-23 MED ORDER — PANTOPRAZOLE SODIUM 40 MG PO TBEC
40.0000 mg | DELAYED_RELEASE_TABLET | Freq: Two times a day (BID) | ORAL | Status: DC
  Administered 2019-07-23 – 2019-07-26 (×7): 40 mg via ORAL
  Filled 2019-07-23 (×7): qty 1

## 2019-07-23 MED ORDER — PREDNISONE 20 MG PO TABS
20.0000 mg | ORAL_TABLET | Freq: Every day | ORAL | Status: DC
  Administered 2019-07-24 – 2019-07-26 (×3): 20 mg via ORAL
  Filled 2019-07-23 (×3): qty 1

## 2019-07-23 MED ORDER — IPRATROPIUM-ALBUTEROL 20-100 MCG/ACT IN AERS: 2.0000 | INHALATION_SPRAY | Freq: Four times a day (QID) | RESPIRATORY_TRACT | Status: DC

## 2019-07-23 MED ORDER — MAGNESIUM SULFATE 2 GM/50ML IV SOLN
2.0000 g | Freq: Once | INTRAVENOUS | Status: AC
  Administered 2019-07-23: 2 g via INTRAVENOUS
  Filled 2019-07-23: qty 50

## 2019-07-23 NOTE — ED Notes (Signed)
Patient family brought outside fast food and charger to Ed; Given to patient by NT-Monique,RN

## 2019-07-23 NOTE — ED Notes (Signed)
Patient father Kenneth Lane returning our call [phone] he thinks patient may have tried to call him

## 2019-07-23 NOTE — Progress Notes (Signed)
This is a 33 year old male who was admitted early this morning with medical history of asthma presenting to the ED with chief complaint of shortness of breath x3 days home nebulizer treatments/as needed albuterol.  No prior hospitalizations related to asthma.  Previously smoked 1 pack of cigarettes but stopped 3 weeks ago.  In the ED he was nonhypoxic Covid negative group A strep negative, CXR, EKG without acute ischemic changes.  Given Decadron 10 mg magnesium sulfate and started on Solu-Medrol as well as duo nebs.  This a.m. he was evaluated in the ED and reported that he frequently has asthma-like exacerbations at night and has acid reflux.  He was given Pulmicort nebulizer and started on twice daily PPI.  He was reevaluated in the evening and had some symptomatic improvement however on ambulation with nurse 10/27 patient had increased shortness of breath and an episode of coughing induced vomiting.  He was admitted to med telemetry for further observation.  General: Anxious, tolerating room air Heart: Tachycardic, no murmurs Lungs: Diffuse expiratory wheeze  A/P  1. Acute asthma exacerbation, Suspect suboptimal therapy at home as well as GERD induced, Covid negative 1. Change Solu-Medrol to prednisone 20 mg daily for total 5 days 2. Add Pulmicort twice daily 3. Add PPI twice daily 4. Continue as needed duo nebs and albuterol 5. Recommend close follow-up with PCP and establish with outpatient pulmonology 2. Coughing induced nausea/vomiting 1. Zofran 2. QTC check, start Tessalon Perles 3. Inferior lead T wave inversions, noted on multiple EKGs in the past 1. Asymptomatic 2. Echo 3. Telemetry    Marva Panda, [place] Pager: [phone]

## 2019-07-23 NOTE — ED Notes (Addendum)
Admitting MD at bedside.

## 2019-07-23 NOTE — ED Notes (Signed)
Admitting MD in w pt

## 2019-07-23 NOTE — H&P (Signed)
History and Physical    ANTWONE CAPOZZOLI JKD:[id number] DOB: July 30, 1985 DOA: 07/22/2019  PCP: [facility] Patient coming from: Home  Chief Complaint: Shortness of breath  HPI: HARLIN MAZZONI is a 33 y.o. male with medical history significant of asthma presenting to the ED with a chief complaint of shortness of breath.  Patient states for the past 3 days he has had increasing shortness of breath, wheezing, and cough.  He has asthma and the only home medications he uses is albuterol as needed.  He does not have any other inhalers.  Denies prior hospitalizations related to his asthma.  He was vomiting yesterday and not able to keep any food down but the vomiting has now resolved.  He was able to eat food in the emergency room and does not feel nauseous.  He has not had any abdominal pain or diarrhea.  Denies fevers or recent sick contacts.  He was previously smoking 1 pack of cigarettes per day but stopped smoking 3 weeks ago.  ED Course: Not hypoxic, oxygen saturation 97-100% on room air.  Rapid group A strep PCR negative.  SARS-CoV-2 PCR test pending.  CBC and BMP pending.  Chest x-ray showing no active disease.  EKG without acute ischemic changes.  Patient continued to have significant wheezing despite receiving multiple albuterol MDI treatments and Decadron 10 mg.  Admission requested for acute asthma exacerbation.  Review of Systems:  All systems reviewed and apart from history of presenting illness, are negative.  Past Medical History:  Diagnosis Date  . Anxiety   . Asthma   . Bradycardia   . Seasonal allergies     Past Surgical History:  Procedure Laterality Date  . DENTAL SURGERY       reports that he has been smoking cigarettes. He has been smoking about 1.00 pack per day. He has never used smokeless tobacco. He reports current alcohol use. He reports current drug use. Drug: Marijuana.  Allergies  Allergen Reactions  . Shellfish-Derived Products Anaphylaxis,  Swelling and Rash  . Orange Concentrate [Flavoring Agent]     Family History  Problem Relation Age of Onset  . Cancer Other   . Hypertension Other     Prior to Admission medications   Medication Sig Start Date End Date Taking? Authorizing Provider  albuterol (PROVENTIL HFA;VENTOLIN HFA) 108 (90 Base) MCG/ACT inhaler Inhale 2 puffs into the lungs every 6 (six) hours as needed for wheezing or shortness of breath. 11/01/18   Shelda Pal, DO  escitalopram (LEXAPRO) 10 MG tablet Take 1 tablet (10 mg total) by mouth daily. 02/27/19 07/18/19  Loura Halt A, NP  fluticasone (FLOVENT HFA) 110 MCG/ACT inhaler Take 2 puffs twice daily for 1-2 weeks when in your yellow zone. 11/01/18 07/18/19  Shelda Pal, DO  levocetirizine (XYZAL) 5 MG tablet Take 1 tablet (5 mg total) by mouth every evening. 11/01/18 02/27/19  Shelda Pal, DO  sucralfate (CARAFATE) 1 G tablet Take 1 tablet (1 g total) by mouth 4 (four) times daily. 06/08/11 10/20/11  Noemi Chapel, MD    Physical Exam: Vitals:   07/23/19 0000 07/23/19 0100 07/23/19 0110 07/23/19 0115  BP:    129/72  Pulse: 85 95 91 89  Resp:   (!) 30 18  Temp:      TempSrc:      SpO2: 98% 99% 98% 100%    Physical Exam  Constitutional: He is oriented to person, place, and time. He appears well-developed and well-nourished.  No distress.  HENT:  Head: Normocephalic.  Eyes: Right eye exhibits no discharge. Left eye exhibits no discharge.  Cardiovascular: Normal rate, regular rhythm and intact distal pulses.  Pulmonary/Chest: Effort normal. No respiratory distress. He has wheezes. He has no rales.  Speaking clearly in full sentences No increased work of breathing Oxygen saturation 100% on room air Significant wheezing  Abdominal: Soft. Bowel sounds are normal. He exhibits no distension. There is no abdominal tenderness. There is no guarding.  Musculoskeletal:        General: No edema.     Cervical back: Neck supple.    Neurological: He is alert and oriented to person, place, and time.  Skin: Skin is warm and dry. He is not diaphoretic.     Labs on Admission: I have personally reviewed following labs and imaging studies  CBC: Recent Labs  Lab 07/23/19 0040  WBC 9.5  NEUTROABS 8.7*  HGB 15.6  HCT 44.8  MCV 86.0  PLT 197   Basic Metabolic Panel: No results for input(s): NA, K, CL, CO2, GLUCOSE, BUN, CREATININE, CALCIUM, MG, PHOS in the last 168 hours. GFR: CrCl cannot be calculated (Patient's most recent lab result is older than the maximum 21 days allowed.). Liver Function Tests: No results for input(s): AST, ALT, ALKPHOS, BILITOT, PROT, ALBUMIN in the last 168 hours. No results for input(s): LIPASE, AMYLASE in the last 168 hours. No results for input(s): AMMONIA in the last 168 hours. Coagulation Profile: No results for input(s): INR, PROTIME in the last 168 hours. Cardiac Enzymes: No results for input(s): CKTOTAL, CKMB, CKMBINDEX, TROPONINI in the last 168 hours. BNP (last 3 results) No results for input(s): PROBNP in the last 8760 hours. HbA1C: No results for input(s): HGBA1C in the last 72 hours. CBG: No results for input(s): GLUCAP in the last 168 hours. Lipid Profile: No results for input(s): CHOL, HDL, LDLCALC, TRIG, CHOLHDL, LDLDIRECT in the last 72 hours. Thyroid Function Tests: No results for input(s): TSH, T4TOTAL, FREET4, T3FREE, THYROIDAB in the last 72 hours. Anemia Panel: No results for input(s): VITAMINB12, FOLATE, FERRITIN, TIBC, IRON, RETICCTPCT in the last 72 hours. Urine analysis:    Component Value Date/Time   COLORURINE YELLOW 07/02/2018 1048   APPEARANCEUR TURBID (A) 07/02/2018 1048   LABSPEC 1.025 07/18/2019 1822   PHURINE 7.0 07/18/2019 1822   GLUCOSEU NEGATIVE 07/18/2019 1822   HGBUR NEGATIVE 07/18/2019 1822   BILIRUBINUR NEGATIVE 07/18/2019 1822   KETONESUR NEGATIVE 07/18/2019 1822   PROTEINUR NEGATIVE 07/18/2019 1822   UROBILINOGEN 0.2 07/18/2019  1822   NITRITE NEGATIVE 07/18/2019 1822   LEUKOCYTESUR NEGATIVE 07/18/2019 1822    Radiological Exams on Admission: DG Chest Portable 1 View  Result Date: 07/22/2019 CLINICAL DATA:  Shortness of breath EXAM: PORTABLE CHEST 1 VIEW COMPARISON:  06/04/2019 FINDINGS: The heart size and mediastinal contours are within normal limits. Both lungs are clear. The visualized skeletal structures are unremarkable. IMPRESSION: No active disease. Electronically Signed   By: Deatra Robinson M.D.   On: 07/22/2019 23:19    EKG: Independently reviewed.  Normal sinus rhythm, LVH.  T wave inversions in inferior and lateral leads similar to prior tracing from 03/29/2019.  Assessment/Plan Principal Problem:   Acute asthma exacerbation Active Problems:   Emesis   Acute asthma exacerbation Suspect related to suboptimal therapy at home.  It seems he is only using albuterol as needed and does not have a maintenance inhaler.  Continues to have significant wheezing despite receiving multiple treatments of albuterol MDI and Decadron  10 mg in the ED.  No hypoxia or increased work of breathing.  Currently satting 100% on room air. -Solu-Medrol 60 mg every 6 hours -IV magnesium sulfate 2 g -Combivent inhaler scheduled every 6 hours and albuterol MDI as needed.  SARS-CoV-2 rapid antigen test has been ordered.  If negative, switch to nebulizer treatments. -Robitussin-DM as needed cough -Continuous pulse ox -Supplemental oxygen as needed to keep oxygen saturation above 94%  Emesis Patient states he was vomiting at home yesterday and was not able to keep any food down.  He was not having any abdominal pain or diarrhea.  Currently not nauseous and was able to tolerate p.o. intake in the ED.  Abdominal exam benign.  No fever. -CBC pending to check for leukocytosis -Check lipase and LFTs -SARS-CoV-2 test pending, continue airborne and contact precautions  HIV screening The patient falls between the ages of 13-64 and should  be screened for HIV, therefore HIV testing ordered.  DVT prophylaxis: Subcutaneous heparin Code Status: Full code Family Communication: No family available at this time. Disposition Plan: Anticipate discharge after clinical improvement. Consults called: None Admission status: It is my clinical opinion that referral for OBSERVATION is reasonable and necessary in this patient based on the above information provided. The aforementioned taken together are felt to place the patient at high risk for further clinical deterioration. However it is anticipated that the patient may be medically stable for discharge from the hospital within 24 to 48 hours.  The medical decision making on this patient was of high complexity and the patient is at high risk for clinical deterioration, therefore this is a level 3 visit.  John GiovanniVasundhra Meldon Hanzlik MD [facility] Pager [phone]- [phone]  If 7PM-7AM, please contact night-coverage [email or web address] Password [email or web address]  07/23/2019, 1:30 AM

## 2019-07-23 NOTE — ED Notes (Signed)
Pt being transported to [facility] via stretcher w/telemetry.

## 2019-07-24 ENCOUNTER — Inpatient Hospital Stay (HOSPITAL_COMMUNITY): Payer: Medicare Other

## 2019-07-24 DIAGNOSIS — J029 Acute pharyngitis, unspecified: Secondary | ICD-10-CM

## 2019-07-24 DIAGNOSIS — J4541 Moderate persistent asthma with (acute) exacerbation: Principal | ICD-10-CM

## 2019-07-24 DIAGNOSIS — R9431 Abnormal electrocardiogram [ECG] [EKG]: Secondary | ICD-10-CM

## 2019-07-24 LAB — CBC
HCT: 43.4 % (ref 39.0–52.0)
Hemoglobin: 15 g/dL (ref 13.0–17.0)
MCH: 29.9 pg (ref 26.0–34.0)
MCHC: 34.6 g/dL (ref 30.0–36.0)
MCV: 86.5 fL (ref 80.0–100.0)
Platelets: 226 10*3/uL (ref 150–400)
RBC: 5.02 MIL/uL (ref 4.22–5.81)
RDW: 13.9 % (ref 11.5–15.5)
WBC: 14.9 10*3/uL — ABNORMAL HIGH (ref 4.0–10.5)
nRBC: 0 % (ref 0.0–0.2)

## 2019-07-24 LAB — BASIC METABOLIC PANEL
Anion gap: 10 (ref 5–15)
BUN: 14 mg/dL (ref 6–20)
CO2: 23 mmol/L (ref 22–32)
Calcium: 9.6 mg/dL (ref 8.9–10.3)
Chloride: 105 mmol/L (ref 98–111)
Creatinine, Ser: 0.99 mg/dL (ref 0.61–1.24)
GFR calc Af Amer: >60 mL/min (ref >60)
GFR calc non Af Amer: >60 mL/min (ref >60)
Glucose, Bld: 136 mg/dL — ABNORMAL HIGH (ref 70–99)
Potassium: 4.1 mmol/L (ref 3.5–5.1)
Sodium: 138 mmol/L (ref 135–145)

## 2019-07-24 LAB — MAGNESIUM: Magnesium: 2.2 mg/dL (ref 1.7–2.4)

## 2019-07-24 LAB — ECHOCARDIOGRAM COMPLETE

## 2019-07-24 MED ORDER — GUAIFENESIN-DM 100-10 MG/5ML PO SYRP
5.0000 mL | ORAL_SOLUTION | Freq: Four times a day (QID) | ORAL | Status: DC
  Administered 2019-07-24 – 2019-07-26 (×9): 5 mL via ORAL
  Filled 2019-07-24 (×12): qty 5

## 2019-07-24 MED ORDER — HYDROCOD POLST-CPM POLST ER 10-8 MG/5ML PO SUER
5.0000 mL | Freq: Two times a day (BID) | ORAL | Status: DC
  Administered 2019-07-24 – 2019-07-26 (×5): 5 mL via ORAL
  Filled 2019-07-24 (×5): qty 5

## 2019-07-24 MED ORDER — PREDNISONE 20 MG PO TABS
20.0000 mg | ORAL_TABLET | Freq: Once | ORAL | Status: AC
  Administered 2019-07-24: 20 mg via ORAL
  Filled 2019-07-24: qty 1

## 2019-07-24 MED ORDER — IPRATROPIUM-ALBUTEROL 0.5-2.5 (3) MG/3ML IN SOLN
3.0000 mL | RESPIRATORY_TRACT | Status: DC
  Administered 2019-07-24 – 2019-07-25 (×7): 3 mL via RESPIRATORY_TRACT
  Filled 2019-07-24 (×8): qty 3

## 2019-07-24 NOTE — Progress Notes (Signed)
  Echocardiogram 2D Echocardiogram has been performed.  Jannett Celestine 07/24/2019, 9:37 AM

## 2019-07-24 NOTE — Progress Notes (Signed)
PROGRESS NOTE    Phoebe SharpsJames E Dunphy   UJW:[id number]  DOB: [birth year]  DOA: 07/22/2019 [facility]   Brief Narrative:  Phoebe SharpsJames E Degregorio is a 33 y.o. male with medical history significant of asthma presenting to the ED with a chief complaint of shortness of breath.  Patient states for the past 3 days he has had increasing shortness of breath, wheezing, and cough.  He has asthma and the only home medications he uses is albuterol as needed.  He does not have any other inhalers.  Denies prior hospitalizations related to his asthma.  He was vomiting yesterday and not able to keep any food down but the vomiting has now resolved.  He was able to eat food in the emergency room and does not feel nauseous.  He has not had any abdominal pain or diarrhea.  Denies fevers or recent sick contacts.  He was previously smoking 1 pack of cigarettes per day but stopped smoking 3 weeks ago.   Subjective: Having severe cough. Very short of breath with mild exertion.     Assessment & Plan:   Principal Problem:   Acute asthma exacerbation - add Tussinex- increase Prednisone - will give 40 mg today - increase frequency of Duonebs to every 4 hrs - not safe to d/c home just yet due to ongoing dyspnea and severe cough  Active Problems:   Emesis  - PRN antiemetics- eating well now   Time spent in minutes: 35 DVT prophylaxis: Heparin Code Status: Full code Family Communication:  Disposition Plan: home Consultants:    Procedures:    Antimicrobials:  Anti-infectives (From admission, onward)   None       Objective: Vitals:   07/23/19 1243 07/23/19 1957 07/23/19 2148 07/24/19 0618  BP: 136/75  118/67 109/72  Pulse: 81  68 67  Resp: 18  18 (!) 21  Temp: 98.3 F (36.8 C)  98 F (36.7 C) 98.5 F (36.9 C)  TempSrc: Oral  Oral Oral  SpO2: 95% 97% 96% 97%   No intake or output data in the 24 hours ending 07/24/19 1117 There were no vitals filed for this visit.   Examination: General exam: Appears comfortable  HEENT: PERRLA, oral mucosa moist, no sclera icterus or thrush Respiratory system: b/l rhonchi and wheezing with shortness of breath- Respiratory effort normal. Cardiovascular system: S1 & S2 heard, RRR.   Gastrointestinal system: Abdomen soft, non-tender, nondistended. Normal bowel sounds. Central nervous system: Alert and oriented. No focal neurological deficits. Extremities: No cyanosis, clubbing or edema Skin: No rashes or ulcers Psychiatry:  Mood & affect appropriate.     Data Reviewed: I have personally reviewed following labs and imaging studies  CBC: Recent Labs  Lab 07/23/19 0040 07/24/19 0508  WBC 9.5 14.9*  NEUTROABS 8.7*  --   HGB 15.6 15.0  HCT 44.8 43.4  MCV 86.0 86.5  PLT 197 226   Basic Metabolic Panel: Recent Labs  Lab 07/23/19 0040 07/24/19 0508  NA 136 138  K 3.4* 4.1  CL 102 105  CO2 23 23  GLUCOSE 164* 136*  BUN 14 14  CREATININE 1.05 0.99  CALCIUM 9.4 9.6  MG  --  2.2   GFR: CrCl cannot be calculated (Unknown ideal weight.). Liver Function Tests: Recent Labs  Lab 07/23/19 0208  AST 30  ALT 20  ALKPHOS 69  BILITOT 0.3  PROT 6.7  ALBUMIN 4.1   Recent Labs  Lab 07/23/19 0208  LIPASE 20   No  results for input(s): AMMONIA in the last 168 hours. Coagulation Profile: No results for input(s): INR, PROTIME in the last 168 hours. Cardiac Enzymes: No results for input(s): CKTOTAL, CKMB, CKMBINDEX, TROPONINI in the last 168 hours. BNP (last 3 results) No results for input(s): PROBNP in the last 8760 hours. HbA1C: No results for input(s): HGBA1C in the last 72 hours. CBG: No results for input(s): GLUCAP in the last 168 hours. Lipid Profile: No results for input(s): CHOL, HDL, LDLCALC, TRIG, CHOLHDL, LDLDIRECT in the last 72 hours. Thyroid Function Tests: No results for input(s): TSH, T4TOTAL, FREET4, T3FREE, THYROIDAB in the last 72 hours. Anemia Panel: No results for input(s):  VITAMINB12, FOLATE, FERRITIN, TIBC, IRON, RETICCTPCT in the last 72 hours. Urine analysis:    Component Value Date/Time   COLORURINE YELLOW 07/02/2018 1048   APPEARANCEUR TURBID (A) 07/02/2018 1048   LABSPEC 1.025 07/18/2019 1822   PHURINE 7.0 07/18/2019 1822   GLUCOSEU NEGATIVE 07/18/2019 1822   HGBUR NEGATIVE 07/18/2019 1822   BILIRUBINUR NEGATIVE 07/18/2019 1822   KETONESUR NEGATIVE 07/18/2019 1822   PROTEINUR NEGATIVE 07/18/2019 1822   UROBILINOGEN 0.2 07/18/2019 1822   NITRITE NEGATIVE 07/18/2019 1822   LEUKOCYTESUR NEGATIVE 07/18/2019 1822   Sepsis Labs: @LABRCNTIP (procalcitonin:4,lacticidven:4) ) Recent Results (from the past 240 hour(s))  Group A Strep by PCR     Status: None   Collection Time: 07/22/19  9:11 PM   Specimen: Throat; Sterile Swab  Result Value Ref Range Status   Group A Strep by PCR NOT DETECTED NOT DETECTED Final    Comment: Performed at [facility], [address], [place], [place]  SARS CORONAVIRUS 2 (TAT 6-24 HRS) Nasopharyngeal Nasopharyngeal Swab     Status: None   Collection Time: 07/22/19 10:07 PM   Specimen: Nasopharyngeal Swab  Result Value Ref Range Status   SARS Coronavirus 2 NEGATIVE NEGATIVE Final    Comment: (NOTE) SARS-CoV-2 target nucleic acids are NOT DETECTED. The SARS-CoV-2 RNA is generally detectable in upper and lower respiratory specimens during the acute phase of infection. Negative results do not preclude SARS-CoV-2 infection, do not rule out co-infections with other pathogens, and should not be used as the sole basis for treatment or other patient management decisions. Negative results must be combined with clinical observations, patient history, and epidemiological information. The expected result is Negative. Fact Sheet for Patients: 07/24/19 Fact Sheet for Healthcare Providers: HairSlick.no This test is not yet approved or cleared by the  [place] FDA and  has been authorized for detection and/or diagnosis of SARS-CoV-2 by FDA under an Emergency Use Authorization (EUA). This EUA will remain  in effect (meaning this test can be used) for the duration of the COVID-19 declaration under Section 56 4(b)(1) of the Act, 21 U.S.C. section 360bbb-3(b)(1), unless the authorization is terminated or revoked sooner. Performed at [facility], [address], [place], [place]   Respiratory Panel by RT PCR (Flu A&B, Covid) - Nasopharyngeal Swab     Status: None   Collection Time: 07/23/19  1:01 AM   Specimen: Nasopharyngeal Swab  Result Value Ref Range Status   SARS Coronavirus 2 by RT PCR NEGATIVE NEGATIVE Final    Comment: (NOTE) SARS-CoV-2 target nucleic acids are NOT DETECTED. The SARS-CoV-2 RNA is generally detectable in upper respiratoy specimens during the acute phase of infection. The lowest concentration of SARS-CoV-2 viral copies this assay can detect is 131 copies/mL. A negative result does not preclude SARS-Cov-2 infection and should not be  used as the sole basis for treatment or other patient management decisions. A negative result may occur with  improper specimen collection/handling, submission of specimen other than nasopharyngeal swab, presence of viral mutation(s) within the areas targeted by this assay, and inadequate number of viral copies (<131 copies/mL). A negative result must be combined with clinical observations, patient history, and epidemiological information. The expected result is Negative. Fact Sheet for Patients:  PinkCheek.be Fact Sheet for Healthcare Providers:  GravelBags.it This test is not yet ap proved or cleared by the [place] FDA and  has been authorized for detection and/or diagnosis of SARS-CoV-2 by FDA under an Emergency Use Authorization (EUA). This EUA will remain  in effect (meaning this test can be  used) for the duration of the COVID-19 declaration under Section 564(b)(1) of the Act, 21 U.S.C. section 360bbb-3(b)(1), unless the authorization is terminated or revoked sooner.    Influenza A by PCR NEGATIVE NEGATIVE Final   Influenza B by PCR NEGATIVE NEGATIVE Final    Comment: (NOTE) The Xpert Xpress SARS-CoV-2/FLU/RSV assay is intended as an aid in  the diagnosis of influenza from Nasopharyngeal swab specimens and  should not be used as a sole basis for treatment. Nasal washings and  aspirates are unacceptable for Xpert Xpress SARS-CoV-2/FLU/RSV  testing. Fact Sheet for Patients: PinkCheek.be Fact Sheet for Healthcare Providers: GravelBags.it This test is not yet approved or cleared by the [place] FDA and  has been authorized for detection and/or diagnosis of SARS-CoV-2 by  FDA under an Emergency Use Authorization (EUA). This EUA will remain  in effect (meaning this test can be used) for the duration of the  Covid-19 declaration under Section 564(b)(1) of the Act, 21  U.S.C. section 360bbb-3(b)(1), unless the authorization is  terminated or revoked. Performed at [facility], [place] [address], [place], [place] [address]          Radiology Studies: DG Chest Portable 1 View  Result Date: 07/22/2019 CLINICAL DATA:  Shortness of breath EXAM: PORTABLE CHEST 1 VIEW COMPARISON:  06/04/2019 FINDINGS: The heart size and mediastinal contours are within normal limits. Both lungs are clear. The visualized skeletal structures are unremarkable. IMPRESSION: No active disease. Electronically Signed   By: Ulyses Jarred M.D.   On: 07/22/2019 23:19   ECHOCARDIOGRAM COMPLETE  Result Date: 07/24/2019   ECHOCARDIOGRAM REPORT   Patient Name:   LENNY FIUMARA Date of Exam: 07/24/2019 Medical Rec #:  [id number]        Height:       65.0 in Accession #:    [id number]       Weight:       134.0 lb Date of Birth:  Aug 21, 1985         BSA:          1.67 m Patient Age:    33 years         BP:           109/72 mmHg Patient Gender: M                HR:           67 bpm. Exam Location:  Inpatient Procedure: 2D Echo Indications:    Abnormal ECG 794.31  History:        Patient has no prior history of Echocardiogram examinations.                 Risk Factors:Current Smoker. Bradycardia.  Sonographer:    Jannett Celestine  RDCS (AE) Referring Phys: [id number] JARED E SEGAL IMPRESSIONS  1. Left ventricular ejection fraction, by visual estimation, is 60 to 65%. The left ventricle has normal function. There is no left ventricular hypertrophy.  2. The left ventricle has no regional wall motion abnormalities.  3. Global right ventricle has normal systolic function.The right ventricular size is normal. No increase in right ventricular wall thickness.  4. Left atrial size was normal.  5. Right atrial size was normal.  6. The mitral valve is normal in structure. No evidence of mitral valve regurgitation.  7. The tricuspid valve is grossly normal.  8. The aortic valve is tricuspid. Aortic valve regurgitation is not visualized.  9. The pulmonic valve was grossly normal. Pulmonic valve regurgitation is not visualized. 10. Normal Echocardiogram. FINDINGS  Left Ventricle: Left ventricular ejection fraction, by visual estimation, is 60 to 65%. The left ventricle has normal function. The left ventricle has no regional wall motion abnormalities. There is no left ventricular hypertrophy. Left ventricular diastolic parameters were normal. Right Ventricle: The right ventricular size is normal. No increase in right ventricular wall thickness. Global RV systolic function is has normal systolic function. Left Atrium: Left atrial size was normal in size. Right Atrium: Right atrial size was normal in size Pericardium: There is no evidence of pericardial effusion. Mitral Valve: The mitral valve is normal in structure. No evidence of mitral valve regurgitation. Tricuspid Valve: The  tricuspid valve is grossly normal. Tricuspid valve regurgitation is not demonstrated. Aortic Valve: The aortic valve is tricuspid. Aortic valve regurgitation is not visualized. Pulmonic Valve: The pulmonic valve was grossly normal. Pulmonic valve regurgitation is not visualized. Pulmonic regurgitation is not visualized. Aorta: The aortic root and ascending aorta are structurally normal, with no evidence of dilitation. IAS/Shunts: No atrial level shunt detected by color flow Doppler.  LEFT VENTRICLE PLAX 2D LVIDd:         5.00 cm  Diastology LVIDs:         3.00 cm  LV e' lateral:   20.10 cm/s LV PW:         0.90 cm  LV E/e' lateral: 5.2 LV IVS:        0.80 cm  LV e' medial:    12.10 cm/s LVOT diam:     1.90 cm  LV E/e' medial:  8.7 LV SV:         83 ml LV SV Index:   49.77 LVOT Area:     2.84 cm  RIGHT VENTRICLE RV S prime:     13.60 cm/s TAPSE (M-mode): 2.5 cm LEFT ATRIUM             Index       RIGHT ATRIUM           Index LA diam:        3.60 cm 2.16 cm/m  RA Area:     13.00 cm LA Vol (A2C):   34.9 ml 20.92 ml/m RA Volume:   29.60 ml  17.74 ml/m LA Vol (A4C):   33.2 ml 19.90 ml/m LA Biplane Vol: 33.8 ml 20.26 ml/m  AORTIC VALVE LVOT Vmax:   139.00 cm/s LVOT Vmean:  93.100 cm/s LVOT VTI:    0.231 m  AORTA Ao Root diam: 2.30 cm MITRAL VALVE MV Area (PHT): 3.46 cm              SHUNTS MV PHT:        63.51 msec  Systemic VTI:  0.23 m MV Decel Time: 219 msec              Systemic Diam: 1.90 cm MV E velocity: 105.00 cm/s 103 cm/s MV A velocity: 59.10 cm/s  70.3 cm/s MV E/A ratio:  1.78        1.5  Zoila Shutter MD Electronically signed by Zoila Shutter MD Signature Date/Time: 07/24/2019/10:35:29 AM    Final       Scheduled Meds: . benzonatate  100 mg Oral BID  . budesonide (PULMICORT) nebulizer solution  0.25 mg Nebulization BID  . chlorpheniramine-HYDROcodone  5 mL Oral Q12H  . guaiFENesin-dextromethorphan  5 mL Oral QID  . heparin  5,000 Units Subcutaneous Q8H  . ipratropium-albuterol  3 mL  Nebulization Q4H  . pantoprazole  40 mg Oral BID  . predniSONE  20 mg Oral Q breakfast   Continuous Infusions:   LOS: 1 day      Calvert Cantor, MD [facility] Pager: [email or web address] Password [email or web address] 07/24/2019, 11:17 AM

## 2019-07-25 MED ORDER — IPRATROPIUM-ALBUTEROL 0.5-2.5 (3) MG/3ML IN SOLN
3.0000 mL | Freq: Four times a day (QID) | RESPIRATORY_TRACT | Status: DC
  Administered 2019-07-26 (×2): 3 mL via RESPIRATORY_TRACT
  Filled 2019-07-25 (×2): qty 3

## 2019-07-25 NOTE — Progress Notes (Signed)
PROGRESS NOTE    TAYTEN BERGDOLL   EUM:[id number]  DOB: 03/22/1986  DOA: 07/22/2019 PCP: [facility]   Brief Narrative:  LEOVARDO THOMAN is a 33 y.o. male with medical history significant of asthma presenting to the ED with a chief complaint of shortness of breath.  Patient states for the past 3 days he has had increasing shortness of breath, wheezing, and cough.  He has asthma and the only home medications he uses is albuterol as needed.  He does not have any other inhalers.  Denies prior hospitalizations related to his asthma.  He was vomiting yesterday and not able to keep any food down but the vomiting has now resolved.  He was able to eat food in the emergency room and does not feel nauseous.  He has not had any abdominal pain or diarrhea.  Denies fevers or recent sick contacts.  He was previously smoking 1 pack of cigarettes per day but stopped smoking 3 weeks ago.   Subjective: I awaited for the patient to walk again today and he was short of breath and lightheaded. Cough is a bit better but worse when he ambulated.     Assessment & Plan:   Principal Problem:   Acute asthma exacerbation - added Tussinex- increased Prednisone yesterday- weaned down to 20 mg today - increased frequency of Duonebs to every 4 hrs - not safe to d/c home just yet due to ongoing dyspnea and severe cough - I have ordered a neb machine for home use- will need Neb prescriptions  Active Problems:   Emesis  - PRN antiemetics- eating well now   Time spent in minutes: 35 DVT prophylaxis: Heparin Code Status: Full code Family Communication: none Disposition Plan: home Consultants:   none Procedures:   none Antimicrobials:  Anti-infectives (From admission, onward)   None       Objective: Vitals:   07/25/19 0751 07/25/19 1131 07/25/19 1152 07/25/19 1515  BP:   137/80   Pulse:   82   Resp:   18   Temp:      TempSrc:   Oral   SpO2: 97% 97% 97% 97%    Intake/Output  Summary (Last 24 hours) at 07/25/2019 1541 Last data filed at 07/24/2019 1900 Gross per 24 hour  Intake 120 ml  Output --  Net 120 ml   There were no vitals filed for this visit.  Examination: General exam: Appears comfortable  HEENT: PERRLA, oral mucosa moist, no sclera icterus or thrush Respiratory system: wheezing bilaterally. Respiratory effort normal. Cardiovascular system: S1 & S2 heard,  No murmurs  Gastrointestinal system: Abdomen soft, non-tender, nondistended. Normal bowel sounds   Central nervous system: Alert and oriented. No focal neurological deficits. Extremities: No cyanosis, clubbing or edema Skin: No rashes or ulcers Psychiatry:  Mood & affect appropriate.    Data Reviewed: I have personally reviewed following labs and imaging studies  CBC: Recent Labs  Lab 07/23/19 0040 07/24/19 0508  WBC 9.5 14.9*  NEUTROABS 8.7*  --   HGB 15.6 15.0  HCT 44.8 43.4  MCV 86.0 86.5  PLT 197 540   Basic Metabolic Panel: Recent Labs  Lab 07/23/19 0040 07/24/19 0508  NA 136 138  K 3.4* 4.1  CL 102 105  CO2 23 23  GLUCOSE 164* 136*  BUN 14 14  CREATININE 1.05 0.99  CALCIUM 9.4 9.6  MG  --  2.2   GFR: CrCl cannot be calculated (Unknown ideal weight.). Liver Function Tests:  Recent Labs  Lab 07/23/19 0208  AST 30  ALT 20  ALKPHOS 69  BILITOT 0.3  PROT 6.7  ALBUMIN 4.1   Recent Labs  Lab 07/23/19 0208  LIPASE 20   No results for input(s): AMMONIA in the last 168 hours. Coagulation Profile: No results for input(s): INR, PROTIME in the last 168 hours. Cardiac Enzymes: No results for input(s): CKTOTAL, CKMB, CKMBINDEX, TROPONINI in the last 168 hours. BNP (last 3 results) No results for input(s): PROBNP in the last 8760 hours. HbA1C: No results for input(s): HGBA1C in the last 72 hours. CBG: No results for input(s): GLUCAP in the last 168 hours. Lipid Profile: No results for input(s): CHOL, HDL, LDLCALC, TRIG, CHOLHDL, LDLDIRECT in the last 72  hours. Thyroid Function Tests: No results for input(s): TSH, T4TOTAL, FREET4, T3FREE, THYROIDAB in the last 72 hours. Anemia Panel: No results for input(s): VITAMINB12, FOLATE, FERRITIN, TIBC, IRON, RETICCTPCT in the last 72 hours. Urine analysis:    Component Value Date/Time   COLORURINE YELLOW 07/02/2018 1048   APPEARANCEUR TURBID (A) 07/02/2018 1048   LABSPEC 1.025 07/18/2019 1822   PHURINE 7.0 07/18/2019 1822   GLUCOSEU NEGATIVE 07/18/2019 1822   HGBUR NEGATIVE 07/18/2019 1822   BILIRUBINUR NEGATIVE 07/18/2019 1822   KETONESUR NEGATIVE 07/18/2019 1822   PROTEINUR NEGATIVE 07/18/2019 1822   UROBILINOGEN 0.2 07/18/2019 1822   NITRITE NEGATIVE 07/18/2019 1822   LEUKOCYTESUR NEGATIVE 07/18/2019 1822   Sepsis Labs: (procalcitonin:4,lacticidven:4) ) Recent Results (from the past 240 hour(s))  Group A Strep by PCR     Status: None   Collection Time: 07/22/19  9:11 PM   Specimen: Throat; Sterile Swab  Result Value Ref Range Status   Group A Strep by PCR NOT DETECTED NOT DETECTED Final    Comment: Performed at [facility], [address] [place], [place] [address]  SARS CORONAVIRUS 2 (TAT 6-24 HRS) Nasopharyngeal Nasopharyngeal Swab     Status: None   Collection Time: 07/22/19 10:07 PM   Specimen: Nasopharyngeal Swab  Result Value Ref Range Status   SARS Coronavirus 2 NEGATIVE NEGATIVE Final    Comment: (NOTE) SARS-CoV-2 target nucleic acids are NOT DETECTED. The SARS-CoV-2 RNA is generally detectable in upper and lower respiratory specimens during the acute phase of infection. Negative results do not preclude SARS-CoV-2 infection, do not rule out co-infections with other pathogens, and should not be used as the sole basis for treatment or other patient management decisions. Negative results must be combined with clinical observations, patient history, and epidemiological information. The expected result is Negative. Fact Sheet for  Patients: HairSlick.no Fact Sheet for Healthcare Providers: quierodirigir.com This test is not yet approved or cleared by the [place] FDA and  has been authorized for detection and/or diagnosis of SARS-CoV-2 by FDA under an Emergency Use Authorization (EUA). This EUA will remain  in effect (meaning this test can be used) for the duration of the COVID-19 declaration under Section 56 4(b)(1) of the Act, 21 U.S.C. section 360bbb-3(b)(1), unless the authorization is terminated or revoked sooner. Performed at [facility], [address], [place], [place] [address]   Respiratory Panel by RT PCR (Flu A&B, Covid) - Nasopharyngeal Swab     Status: None   Collection Time: 07/23/19  1:01 AM   Specimen: Nasopharyngeal Swab  Result Value Ref Range Status   SARS Coronavirus 2 by RT PCR NEGATIVE NEGATIVE Final    Comment: (NOTE) SARS-CoV-2 target nucleic acids are NOT DETECTED. The SARS-CoV-2 RNA is  generally detectable in upper respiratoy specimens during the acute phase of infection. The lowest concentration of SARS-CoV-2 viral copies this assay can detect is 131 copies/mL. A negative result does not preclude SARS-Cov-2 infection and should not be used as the sole basis for treatment or other patient management decisions. A negative result may occur with  improper specimen collection/handling, submission of specimen other than nasopharyngeal swab, presence of viral mutation(s) within the areas targeted by this assay, and inadequate number of viral copies (<131 copies/mL). A negative result must be combined with clinical observations, patient history, and epidemiological information. The expected result is Negative. Fact Sheet for Patients:  https://www.moore.com/https://www.fda.gov/media/142436/download Fact Sheet for Healthcare Providers:  https://www.young.biz/https://www.fda.gov/media/142435/download This test is not yet ap proved or cleared by the [place] FDA  and  has been authorized for detection and/or diagnosis of SARS-CoV-2 by FDA under an Emergency Use Authorization (EUA). This EUA will remain  in effect (meaning this test can be used) for the duration of the COVID-19 declaration under Section 564(b)(1) of the Act, 21 U.S.C. section 360bbb-3(b)(1), unless the authorization is terminated or revoked sooner.    Influenza A by PCR NEGATIVE NEGATIVE Final   Influenza B by PCR NEGATIVE NEGATIVE Final    Comment: (NOTE) The Xpert Xpress SARS-CoV-2/FLU/RSV assay is intended as an aid in  the diagnosis of influenza from Nasopharyngeal swab specimens and  should not be used as a sole basis for treatment. Nasal washings and  aspirates are unacceptable for Xpert Xpress SARS-CoV-2/FLU/RSV  testing. Fact Sheet for Patients: https://www.moore.com/https://www.fda.gov/media/142436/download Fact Sheet for Healthcare Providers: https://www.young.biz/https://www.fda.gov/media/142435/download This test is not yet approved or cleared by the [place] FDA and  has been authorized for detection and/or diagnosis of SARS-CoV-2 by  FDA under an Emergency Use Authorization (EUA). This EUA will remain  in effect (meaning this test can be used) for the duration of the  Covid-19 declaration under Section 564(b)(1) of the Act, 21  U.S.C. section 360bbb-3(b)(1), unless the authorization is  terminated or revoked. Performed at [facility], [address] [place], [place] [address]          Radiology Studies: ECHOCARDIOGRAM COMPLETE  Result Date: 07/24/2019   ECHOCARDIOGRAM REPORT   Patient Name:   Phoebe SharpsJAMES E Halliday Date of Exam: 07/24/2019 Medical Rec #:  [id number]        Height:       65.0 in Accession #:    [id number]       Weight:       134.0 lb Date of Birth:  [birth year]        BSA:          1.67 m Patient Age:    33 years         BP:           109/72 mmHg Patient Gender: M                HR:           67 bpm. Exam Location:  Inpatient Procedure: 2D Echo Indications:    Abnormal ECG  794.31  History:        Patient has no prior history of Echocardiogram examinations.                 Risk Factors:Current Smoker. Bradycardia.  Sonographer:    Celene SkeenVijay Shankar RDCS (AE) Referring Phys: [id number] JARED E SEGAL IMPRESSIONS  1. Left ventricular ejection fraction, by visual estimation, is 60 to 65%. The left ventricle has  normal function. There is no left ventricular hypertrophy.  2. The left ventricle has no regional wall motion abnormalities.  3. Global right ventricle has normal systolic function.The right ventricular size is normal. No increase in right ventricular wall thickness.  4. Left atrial size was normal.  5. Right atrial size was normal.  6. The mitral valve is normal in structure. No evidence of mitral valve regurgitation.  7. The tricuspid valve is grossly normal.  8. The aortic valve is tricuspid. Aortic valve regurgitation is not visualized.  9. The pulmonic valve was grossly normal. Pulmonic valve regurgitation is not visualized. 10. Normal Echocardiogram. FINDINGS  Left Ventricle: Left ventricular ejection fraction, by visual estimation, is 60 to 65%. The left ventricle has normal function. The left ventricle has no regional wall motion abnormalities. There is no left ventricular hypertrophy. Left ventricular diastolic parameters were normal. Right Ventricle: The right ventricular size is normal. No increase in right ventricular wall thickness. Global RV systolic function is has normal systolic function. Left Atrium: Left atrial size was normal in size. Right Atrium: Right atrial size was normal in size Pericardium: There is no evidence of pericardial effusion. Mitral Valve: The mitral valve is normal in structure. No evidence of mitral valve regurgitation. Tricuspid Valve: The tricuspid valve is grossly normal. Tricuspid valve regurgitation is not demonstrated. Aortic Valve: The aortic valve is tricuspid. Aortic valve regurgitation is not visualized. Pulmonic Valve: The pulmonic valve  was grossly normal. Pulmonic valve regurgitation is not visualized. Pulmonic regurgitation is not visualized. Aorta: The aortic root and ascending aorta are structurally normal, with no evidence of dilitation. IAS/Shunts: No atrial level shunt detected by color flow Doppler.  LEFT VENTRICLE PLAX 2D LVIDd:         5.00 cm  Diastology LVIDs:         3.00 cm  LV e' lateral:   20.10 cm/s LV PW:         0.90 cm  LV E/e' lateral: 5.2 LV IVS:        0.80 cm  LV e' medial:    12.10 cm/s LVOT diam:     1.90 cm  LV E/e' medial:  8.7 LV SV:         83 ml LV SV Index:   49.77 LVOT Area:     2.84 cm  RIGHT VENTRICLE RV S prime:     13.60 cm/s TAPSE (M-mode): 2.5 cm LEFT ATRIUM             Index       RIGHT ATRIUM           Index LA diam:        3.60 cm 2.16 cm/m  RA Area:     13.00 cm LA Vol (A2C):   34.9 ml 20.92 ml/m RA Volume:   29.60 ml  17.74 ml/m LA Vol (A4C):   33.2 ml 19.90 ml/m LA Biplane Vol: 33.8 ml 20.26 ml/m  AORTIC VALVE LVOT Vmax:   139.00 cm/s LVOT Vmean:  93.100 cm/s LVOT VTI:    0.231 m  AORTA Ao Root diam: 2.30 cm MITRAL VALVE MV Area (PHT): 3.46 cm              SHUNTS MV PHT:        63.51 msec            Systemic VTI:  0.23 m MV Decel Time: 219 msec  Systemic Diam: 1.90 cm MV E velocity: 105.00 cm/s 103 cm/s MV A velocity: 59.10 cm/s  70.3 cm/s MV E/A ratio:  1.78        1.5  Zoila Shutter MD Electronically signed by Zoila Shutter MD Signature Date/Time: 07/24/2019/10:35:29 AM    Final       Scheduled Meds: . benzonatate  100 mg Oral BID  . budesonide (PULMICORT) nebulizer solution  0.25 mg Nebulization BID  . chlorpheniramine-HYDROcodone  5 mL Oral Q12H  . guaiFENesin-dextromethorphan  5 mL Oral QID  . heparin  5,000 Units Subcutaneous Q8H  . ipratropium-albuterol  3 mL Nebulization Q4H  . pantoprazole  40 mg Oral BID  . predniSONE  20 mg Oral Q breakfast   Continuous Infusions:   LOS: 2 days      Calvert Cantor, MD [facility] Pager: [email or web address] Password  [facility] 07/25/2019, 3:41 PM

## 2019-07-25 NOTE — Progress Notes (Signed)
NT ambulated with PT his O2 stayed between 97-98% on the way back to the room he stated that " he felt woozy". I went in and spoke to him he stated that he feels okay just lightheaded he feels like if we keeps walking it will get better

## 2019-07-26 MED ORDER — PREDNISONE 10 MG PO TABS: ORAL_TABLET | ORAL | 0 refills | Status: DC

## 2019-07-26 MED ORDER — IPRATROPIUM-ALBUTEROL 0.5-2.5 (3) MG/3ML IN SOLN: 3.0000 mL | Freq: Four times a day (QID) | RESPIRATORY_TRACT | 0 refills | Status: DC

## 2019-07-26 MED ORDER — ALBUTEROL SULFATE (2.5 MG/3ML) 0.083% IN NEBU: 2.5000 mg | INHALATION_SOLUTION | RESPIRATORY_TRACT | 0 refills | Status: DC | PRN

## 2019-07-26 MED ORDER — PANTOPRAZOLE SODIUM 40 MG PO TBEC: 40.0000 mg | DELAYED_RELEASE_TABLET | Freq: Two times a day (BID) | ORAL | 0 refills | Status: DC

## 2019-07-26 MED ORDER — BENZONATATE 100 MG PO CAPS: 100.0000 mg | ORAL_CAPSULE | Freq: Two times a day (BID) | ORAL | 0 refills | Status: DC

## 2019-07-26 MED ORDER — BUDESONIDE 0.25 MG/2ML IN SUSP: 0.2500 mg | Freq: Two times a day (BID) | RESPIRATORY_TRACT | 0 refills | Status: DC

## 2019-07-26 MED ORDER — GUAIFENESIN-DM 100-10 MG/5ML PO SYRP: 5.0000 mL | ORAL_SOLUTION | Freq: Four times a day (QID) | ORAL | 0 refills | Status: DC

## 2019-07-26 NOTE — TOC Initial Note (Signed)
Transition of Care [facility]) - Initial/Assessment Note    Patient Details  Name: Kenneth Lane MRN: [id number] Date of Birth: 01/24/1986  [facility] CM/SW Contact:    Marilu Favre, RN Phone Number: 07/26/2019, 10:08 AM  Clinical Narrative:                  From home with family, has PCP and transportation.   Ordered NEB machine to be delivered to patient's room prior to discharge.  Expected Discharge Plan: Home/Self Care     Patient Goals and CMS Choice Patient states their goals for this hospitalization and ongoing recovery are:: to go home CMS Medicare.gov Compare Post Acute Care list provided to:: Patient Choice offered to / list presented to : NA  Expected Discharge Plan and Services Expected Discharge Plan: Home/Self Care   Discharge Planning Services: CM Consult Post Acute Care Choice: Durable Medical Equipment Living arrangements for the past 2 months: Single Family Home                 DME Arranged: Nebulizer/meds DME Agency: [employer] Date DME Agency Contacted: 07/26/19 Time DME Agency Contacted: 66 Representative spoke with at DME Agency: [place]: NA          Prior Living Arrangements/Services Living arrangements for the past 2 months: Single Family Home Lives with:: Significant Other Patient language and need for interpreter reviewed:: Yes Do you feel safe going back to the place where you live?: Yes      Need for Family Participation in Patient Care: Yes (Comment) Care giver support system in place?: Yes (comment)   Criminal Activity/Legal Involvement Pertinent to Current Situation/Hospitalization: No - Comment as needed  Activities of Daily Living Home Assistive Devices/Equipment: None ADL Screening (condition at time of admission) Patient's cognitive ability adequate to safely complete daily activities?: Yes Is the patient deaf or have difficulty hearing?: No Does the patient have difficulty seeing, even when  wearing glasses/contacts?: No Does the patient have difficulty concentrating, remembering, or making decisions?: No Patient able to express need for assistance with ADLs?: Yes Does the patient have difficulty dressing or bathing?: No Independently performs ADLs?: Yes (appropriate for developmental age) Does the patient have difficulty walking or climbing stairs?: No Weakness of Legs: None Weakness of Arms/Hands: None  Permission Sought/Granted   Permission granted to share information with : No              Emotional Assessment Appearance:: Appears stated age Attitude/Demeanor/Rapport: Engaged Affect (typically observed): Accepting Orientation: : Oriented to Self, Oriented to Place, Oriented to  Time, Oriented to Situation Alcohol / Substance Use: Not Applicable Psych Involvement: No (comment)  Admission diagnosis:  Asthma exacerbation [J45.901] Acute asthma exacerbation [J45.901] Moderate persistent asthma with acute exacerbation [J45.41] Acute pharyngitis, unspecified etiology [J02.9] Patient Active Problem List   Diagnosis Date Noted  . Acute asthma exacerbation 07/23/2019  . Emesis 07/23/2019  . Asthma exacerbation 07/23/2019   PCP:  [facility], [place] Pharm[facility]:   [id number] - [place], [place] [address] [place] [address] Phone: [phone] Fax: [phone]     Social Determinants of Health (SDOH) Interventions    Readmission Risk Interventions No flowsheet data found.

## 2019-07-26 NOTE — Progress Notes (Signed)
RN gave pt discharge instructions and removed IV. Pt has 7 new medications that were escribed to the pt's home pharmacy. Pt packed up all belongings gf at bedside getting dressed for discharge.

## 2019-07-26 NOTE — Discharge Summary (Signed)
Physician Discharge Summary  Kenneth Lane ZOX:[id number] DOB: 1986/06/15 DOA: 07/22/2019  [facility]  Admit date: 07/22/2019 Discharge date: 07/26/2019  Admitted From: home Discharge disposition: home   Recommendations for Outpatient Follow-Up:   1. Close follow up with PCP 2. Tobacco cessation   Discharge Diagnosis:   Principal Problem:   Acute asthma exacerbation Active Problems:   Emesis   Asthma exacerbation    Discharge Condition: Improved.  Diet recommendation: Regular.  Wound care: None.  Code status: Full.   History of Present Illness:   Kenneth SharpsJames E Kopka is a 33 y.o. male with medical history significant of asthma presenting to the ED with a chief complaint of shortness of breath.  Patient states for the past 3 days he has had increasing shortness of breath, wheezing, and cough.  He has asthma and the only home medications he uses is albuterol as needed.  He does not have any other inhalers.  Denies prior hospitalizations related to his asthma.  He was vomiting yesterday and not able to keep any food down but the vomiting has now resolved.  He was able to eat food in the emergency room and does not feel nauseous.  He has not had any abdominal pain or diarrhea.  Denies fevers or recent sick contacts.  He was previously smoking 1 pack of cigarettes per day but stopped smoking 3 weeks ago.   Hospital Course by Problem:   Acute asthma exacerbation - prednisone, wean to off - increased frequency of Duonebs to every 4 hrs -neb machine for home use- will need Neb prescriptions -no further dizziness with walking or hypoxia     Emesis  - resolved: eating well now    Medical Consultants:      Discharge Exam:   Vitals:   07/26/19 0848 07/26/19 0851  BP:    Pulse: (!) 107 (!) 126  Resp:    Temp:    SpO2:     Vitals:   07/26/19 0845 07/26/19 0846 07/26/19 0848 07/26/19 0851  BP:      Pulse: 88 89 (!) 107 (!) 126   Resp:      Temp:      TempSrc:      SpO2:        General exam: Appears calm and comfortable.    The results of significant diagnostics from this hospitalization (including imaging, microbiology, ancillary and laboratory) are listed below for reference.     Procedures and Diagnostic Studies:   DG Chest Portable 1 View  Result Date: 07/22/2019 CLINICAL DATA:  Shortness of breath EXAM: PORTABLE CHEST 1 VIEW COMPARISON:  06/04/2019 FINDINGS: The heart size and mediastinal contours are within normal limits. Both lungs are clear. The visualized skeletal structures are unremarkable. IMPRESSION: No active disease. Electronically Signed   By: Deatra RobinsonKevin  Herman M.D.   On: 07/22/2019 23:19     Labs:   Basic Metabolic Panel: Recent Labs  Lab 07/23/19 0040 07/24/19 0508  NA 136 138  K 3.4* 4.1  CL 102 105  CO2 23 23  GLUCOSE 164* 136*  BUN 14 14  CREATININE 1.05 0.99  CALCIUM 9.4 9.6  MG  --  2.2   GFR CrCl cannot be calculated (Unknown ideal weight.). Liver Function Tests: Recent Labs  Lab 07/23/19 0208  AST 30  ALT 20  ALKPHOS 69  BILITOT 0.3  PROT 6.7  ALBUMIN 4.1   Recent Labs  Lab 07/23/19 0208  LIPASE 20  No results for input(s): AMMONIA in the last 168 hours. Coagulation profile No results for input(s): INR, PROTIME in the last 168 hours.  CBC: Recent Labs  Lab 07/23/19 0040 07/24/19 0508  WBC 9.5 14.9*  NEUTROABS 8.7*  --   HGB 15.6 15.0  HCT 44.8 43.4  MCV 86.0 86.5  PLT 197 226   Cardiac Enzymes: No results for input(s): CKTOTAL, CKMB, CKMBINDEX, TROPONINI in the last 168 hours. BNP: Invalid input(s): POCBNP CBG: No results for input(s): GLUCAP in the last 168 hours. D-Dimer No results for input(s): DDIMER in the last 72 hours. Hgb A1c No results for input(s): HGBA1C in the last 72 hours. Lipid Profile No results for input(s): CHOL, HDL, LDLCALC, TRIG, CHOLHDL, LDLDIRECT in the last 72 hours. Thyroid function studies No results for  input(s): TSH, T4TOTAL, T3FREE, THYROIDAB in the last 72 hours.  Invalid input(s): FREET3 Anemia work up No results for input(s): VITAMINB12, FOLATE, FERRITIN, TIBC, IRON, RETICCTPCT in the last 72 hours. Microbiology Recent Results (from the past 240 hour(s))  Group A Strep by PCR     Status: None   Collection Time: 07/22/19  9:11 PM   Specimen: Throat; Sterile Swab  Result Value Ref Range Status   Group A Strep by PCR NOT DETECTED NOT DETECTED Final    Comment: Performed at [facility], [address], [place], [place] [address]  SARS CORONAVIRUS 2 (TAT 6-24 HRS) Nasopharyngeal Nasopharyngeal Swab     Status: None   Collection Time: 07/22/19 10:07 PM   Specimen: Nasopharyngeal Swab  Result Value Ref Range Status   SARS Coronavirus 2 NEGATIVE NEGATIVE Final    Comment: (NOTE) SARS-CoV-2 target nucleic acids are NOT DETECTED. The SARS-CoV-2 RNA is generally detectable in upper and lower respiratory specimens during the acute phase of infection. Negative results do not preclude SARS-CoV-2 infection, do not rule out co-infections with other pathogens, and should not be used as the sole basis for treatment or other patient management decisions. Negative results must be combined with clinical observations, patient history, and epidemiological information. The expected result is Negative. Fact Sheet for Patients: HairSlick.no Fact Sheet for Healthcare Providers: quierodirigir.com This test is not yet approved or cleared by the [place] FDA and  has been authorized for detection and/or diagnosis of SARS-CoV-2 by FDA under an Emergency Use Authorization (EUA). This EUA will remain  in effect (meaning this test can be used) for the duration of the COVID-19 declaration under Section 56 4(b)(1) of the Act, 21 U.S.C. section 360bbb-3(b)(1), unless the authorization is terminated or revoked sooner. Performed at [facility], [address], [place], [place] [address]   Respiratory Panel by RT PCR (Flu A&B, Covid) - Nasopharyngeal Swab     Status: None   Collection Time: 07/23/19  1:01 AM   Specimen: Nasopharyngeal Swab  Result Value Ref Range Status   SARS Coronavirus 2 by RT PCR NEGATIVE NEGATIVE Final    Comment: (NOTE) SARS-CoV-2 target nucleic acids are NOT DETECTED. The SARS-CoV-2 RNA is generally detectable in upper respiratoy specimens during the acute phase of infection. The lowest concentration of SARS-CoV-2 viral copies this assay can detect is 131 copies/mL. A negative result does not preclude SARS-Cov-2 infection and should not be used as the sole basis for treatment or other patient management decisions. A negative result may occur with  improper specimen collection/handling, submission of specimen other than nasopharyngeal swab, presence of viral mutation(s) within the areas targeted by this assay, and  inadequate number of viral copies (<131 copies/mL). A negative result must be combined with clinical observations, patient history, and epidemiological information. The expected result is Negative. Fact Sheet for Patients:  PinkCheek.be Fact Sheet for Healthcare Providers:  GravelBags.it This test is not yet ap proved or cleared by the [place] FDA and  has been authorized for detection and/or diagnosis of SARS-CoV-2 by FDA under an Emergency Use Authorization (EUA). This EUA will remain  in effect (meaning this test can be used) for the duration of the COVID-19 declaration under Section 564(b)(1) of the Act, 21 U.S.C. section 360bbb-3(b)(1), unless the authorization is terminated or revoked sooner.    Influenza A by PCR NEGATIVE NEGATIVE Final   Influenza B by PCR NEGATIVE NEGATIVE Final    Comment: (NOTE) The Xpert Xpress SARS-CoV-2/FLU/RSV assay is intended as an aid in  the diagnosis of influenza from  Nasopharyngeal swab specimens and  should not be used as a sole basis for treatment. Nasal washings and  aspirates are unacceptable for Xpert Xpress SARS-CoV-2/FLU/RSV  testing. Fact Sheet for Patients: PinkCheek.be Fact Sheet for Healthcare Providers: GravelBags.it This test is not yet approved or cleared by the [place] FDA and  has been authorized for detection and/or diagnosis of SARS-CoV-2 by  FDA under an Emergency Use Authorization (EUA). This EUA will remain  in effect (meaning this test can be used) for the duration of the  Covid-19 declaration under Section 564(b)(1) of the Act, 21  U.S.C. section 360bbb-3(b)(1), unless the authorization is  terminated or revoked. Performed at [facility], [address] [place], [place] [address]      Discharge Instructions:   Discharge Instructions    Diet general   Complete by: As directed    Discharge instructions   Complete by: As directed    Would use the duonebs scheduled for 7-10 days then can use PRN (as needed) Do not smoke, use candles or be around anyone smoking   Increase activity slowly   Complete by: As directed      Allergies as of 07/26/2019      Reactions   Shellfish-derived Products Anaphylaxis, Swelling, Rash   Orange Concentrate [flavoring Agent]       Medication List    TAKE these medications   albuterol 108 (90 Base) MCG/ACT inhaler Commonly known as: VENTOLIN HFA Inhale 2 puffs into the lungs every 6 (six) hours as needed for wheezing or shortness of breath. What changed: Another medication with the same name was added. Make sure you understand how and when to take each.   albuterol (2.5 MG/3ML) 0.083% nebulizer solution Commonly known as: PROVENTIL Take 3 mLs (2.5 mg total) by nebulization every 4 (four) hours as needed for wheezing or shortness of breath. What changed: You were already taking a medication with the same name, and  this prescription was added. Make sure you understand how and when to take each.   benzonatate 100 MG capsule Commonly known as: TESSALON Take 1 capsule (100 mg total) by mouth 2 (two) times daily.   budesonide 0.25 MG/2ML nebulizer solution Commonly known as: PULMICORT Take 2 mLs (0.25 mg total) by nebulization 2 (two) times daily.   guaiFENesin-dextromethorphan 100-10 MG/5ML syrup Commonly known as: ROBITUSSIN DM Take 5 mLs by mouth 4 (four) times daily.   ipratropium-albuterol 0.5-2.5 (3) MG/3ML Soln Commonly known as: DUONEB Take 3 mLs by nebulization 4 (four) times daily for 7 days.   pantoprazole 40 MG tablet Commonly known as: PROTONIX Take  1 tablet (40 mg total) by mouth 2 (two) times daily for 7 days.   predniSONE 10 MG tablet Commonly known as: DELTASONE 20 mg x 4 days then 10 mg x 4 days then 5 mg x 2 days then stop            Durable Medical Equipment  (From admission, onward)         Start     Ordered   07/25/19 1542  For home use only DME Nebulizer machine  Once    Question Answer Comment  Patient needs a nebulizer to treat with the following condition Asthma exacerbation   Length of Need 6 Months      07/25/19 1541         Follow-up Information    Schedule an appointment as soon as possible for a visit  with [facility], [facility].   [place] information: [facility] [place] [address] [phone]            Time coordinating discharge: 35 min  Signed:  Joseph Art DO  [facility] 07/26/2019, 11:12 AM

## 2019-07-26 NOTE — Progress Notes (Signed)
Dider, NT ambulated with the patient in the hall pt stated that he did not get SOB or dizzy during or after walking. O2 sat stayed between 94-98%, pr HR was the only thing that spiked went up to 148 and came down when he was back in his room. Orthostatic VS obtained prior to ambulation.

## 2019-11-08 ENCOUNTER — Emergency Department (HOSPITAL_COMMUNITY): Payer: Medicare Other

## 2019-11-08 ENCOUNTER — Other Ambulatory Visit: Payer: Self-pay

## 2019-11-08 ENCOUNTER — Encounter (HOSPITAL_COMMUNITY): Payer: Self-pay

## 2019-11-08 ENCOUNTER — Emergency Department (HOSPITAL_COMMUNITY)
Admission: EM | Admit: 2019-11-08 | Discharge: 2019-11-08 | Disposition: A | Discharge status: Home / Self Care | Payer: Medicare Other | Attending: Emergency Medicine | Admitting: Emergency Medicine

## 2019-11-08 DIAGNOSIS — Z79899 Other long term (current) drug therapy: Secondary | ICD-10-CM | POA: Insufficient documentation

## 2019-11-08 DIAGNOSIS — F17210 Nicotine dependence, cigarettes, uncomplicated: Secondary | ICD-10-CM | POA: Insufficient documentation

## 2019-11-08 DIAGNOSIS — S40012A Contusion of left shoulder, initial encounter: Secondary | ICD-10-CM | POA: Insufficient documentation

## 2019-11-08 DIAGNOSIS — S8992XA Unspecified injury of left lower leg, initial encounter: Secondary | ICD-10-CM | POA: Diagnosis not present

## 2019-11-08 DIAGNOSIS — S8002XA Contusion of left knee, initial encounter: Secondary | ICD-10-CM | POA: Diagnosis not present

## 2019-11-08 DIAGNOSIS — Y92410 Unspecified street and highway as the place of occurrence of the external cause: Secondary | ICD-10-CM | POA: Insufficient documentation

## 2019-11-08 DIAGNOSIS — Y939 Activity, unspecified: Secondary | ICD-10-CM | POA: Insufficient documentation

## 2019-11-08 DIAGNOSIS — M25512 Pain in left shoulder: Secondary | ICD-10-CM | POA: Diagnosis not present

## 2019-11-08 DIAGNOSIS — M25562 Pain in left knee: Secondary | ICD-10-CM | POA: Diagnosis not present

## 2019-11-08 DIAGNOSIS — F1210 Cannabis abuse, uncomplicated: Secondary | ICD-10-CM | POA: Diagnosis not present

## 2019-11-08 DIAGNOSIS — Y999 Unspecified external cause status: Secondary | ICD-10-CM | POA: Insufficient documentation

## 2019-11-08 DIAGNOSIS — S299XXA Unspecified injury of thorax, initial encounter: Secondary | ICD-10-CM | POA: Diagnosis not present

## 2019-11-08 DIAGNOSIS — S4992XA Unspecified injury of left shoulder and upper arm, initial encounter: Secondary | ICD-10-CM | POA: Diagnosis not present
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
## 2019-11-08 NOTE — Discharge Instructions (Signed)
The x-rays today were normal without any signs of broken bones.  However it is badly bruised.  You can take 2 ibuprofen and 2 Tylenol at the same time every 6 hours for pain.  Try icing the area but no more than 20 minutes at a time.

## 2019-12-09 ENCOUNTER — Other Ambulatory Visit: Payer: Self-pay

## 2019-12-09 ENCOUNTER — Ambulatory Visit (HOSPITAL_COMMUNITY)
Admission: EM | Admit: 2019-12-09 | Discharge: 2019-12-09 | Disposition: A | Discharge status: Home / Self Care | Payer: Medicare Other | Attending: Emergency Medicine | Admitting: Emergency Medicine

## 2019-12-09 ENCOUNTER — Encounter (HOSPITAL_COMMUNITY): Payer: Self-pay | Admitting: *Deleted

## 2019-12-09 DIAGNOSIS — J069 Acute upper respiratory infection, unspecified: Secondary | ICD-10-CM | POA: Insufficient documentation

## 2019-12-09 DIAGNOSIS — F17210 Nicotine dependence, cigarettes, uncomplicated: Secondary | ICD-10-CM | POA: Diagnosis not present

## 2019-12-09 DIAGNOSIS — Z113 Encounter for screening for infections with a predominantly sexual mode of transmission: Secondary | ICD-10-CM | POA: Diagnosis not present

## 2019-12-09 DIAGNOSIS — F419 Anxiety disorder, unspecified: Secondary | ICD-10-CM | POA: Diagnosis not present

## 2019-12-09 DIAGNOSIS — J029 Acute pharyngitis, unspecified: Secondary | ICD-10-CM | POA: Diagnosis not present

## 2019-12-09 DIAGNOSIS — Z79899 Other long term (current) drug therapy: Secondary | ICD-10-CM | POA: Insufficient documentation

## 2019-12-09 DIAGNOSIS — Z20822 Contact with and (suspected) exposure to covid-19: Secondary | ICD-10-CM | POA: Diagnosis not present

## 2019-12-09 DIAGNOSIS — R05 Cough: Secondary | ICD-10-CM | POA: Insufficient documentation

## 2019-12-09 LAB — SARS CORONAVIRUS 2 (TAT 6-24 HRS): SARS Coronavirus 2: NEGATIVE

## 2019-12-09 LAB — HIV ANTIBODY (ROUTINE TESTING W REFLEX): HIV Screen 4th Generation wRfx: NONREACTIVE

## 2019-12-09 MED ORDER — PREDNISONE 50 MG PO TABS: 50.0000 mg | ORAL_TABLET | Freq: Every day | ORAL | 0 refills | Status: AC

## 2019-12-09 MED ORDER — FLUTICASONE PROPIONATE 50 MCG/ACT NA SUSP: 1.0000 | Freq: Every day | NASAL | 0 refills | Status: DC

## 2019-12-09 MED ORDER — ALBUTEROL SULFATE HFA 108 (90 BASE) MCG/ACT IN AERS: 1.0000 | INHALATION_SPRAY | Freq: Four times a day (QID) | RESPIRATORY_TRACT | 0 refills | Status: DC | PRN

## 2019-12-09 MED ORDER — BENZONATATE 200 MG PO CAPS: 200.0000 mg | ORAL_CAPSULE | Freq: Three times a day (TID) | ORAL | 0 refills | Status: AC | PRN

## 2019-12-09 NOTE — ED Triage Notes (Addendum)
Pt c/o wheezing, cough, sore throat, occasional body aches x 2 days.  Son currently has fever. States does not have any inhalers at home. Also requesting testing for STDs, but denies sxs.

## 2019-12-09 NOTE — Discharge Instructions (Signed)
Covid test and STD screening pending, monitor my chart for results Please use albuterol inhaler as needed for shortness of breath, wheezing Begin prednisone daily for the next 5 days-take in morning with food to further help with wheezing and inflammation in chest Tessalon/benzonatate every 8 hours as needed for cough Flonase nasal spray 1 to 2 spray in each nostril daily Rest and drink plenty of fluids  Please follow-up if any symptoms not improving or worsening

## 2019-12-10 LAB — RPR: RPR Ser Ql: NONREACTIVE

## 2019-12-10 NOTE — ED Provider Notes (Signed)
[place]    CSN: [id number] Arrival date & time: 12/09/19  1523      History   Chief Complaint Chief Complaint  Patient presents with  . Cough  . Sore Throat  . STD Check    HPI Kenneth Lane is a 34 y.o. male history of asthma presenting today for evaluation of cough congestion as well as STD screening.  Patient notes that over the past couple of days he has had congestion sore throat and cough.  He reports his multiple close contacts around him that have been sick, reports negative Covid testing.  He has also felt his asthma has flared up.  Reports a lot of wheezing and chest tightness.  Would like to be screened for STDs, denies any symptoms of dysuria, increased frequency or urgency.  Denies penile discharge.  Denies testicle pain or swelling.  Would like to also be screened for HIV and syphilis today.  HPI  Past Medical History:  Diagnosis Date  . Anxiety   . Asthma   . Bradycardia   . Seasonal allergies     Patient Active Problem List   Diagnosis Date Noted  . Acute asthma exacerbation 07/23/2019  . Emesis 07/23/2019  . Asthma exacerbation 07/23/2019    Past Surgical History:  Procedure Laterality Date  . DENTAL SURGERY         Home Medications    Prior to Admission medications   Medication Sig Start Date End Date Taking? Authorizing Provider  albuterol (VENTOLIN HFA) 108 (90 Base) MCG/ACT inhaler Inhale 1-2 puffs into the lungs every 6 (six) hours as needed for wheezing or shortness of breath. 12/09/19   Khaalid Lefkowitz C, PA-C  benzonatate (TESSALON) 200 MG capsule Take 1 capsule (200 mg total) by mouth 3 (three) times daily as needed for up to 7 days for cough. 12/09/19 12/16/19  Lacreshia Bondarenko C, PA-C  fluticasone (FLONASE) 50 MCG/ACT nasal spray Place 1-2 sprays into both nostrils daily for 7 days. 12/09/19 12/16/19  Jacobe Study C, PA-C  predniSONE (DELTASONE) 50 MG tablet Take 1 tablet (50 mg total) by mouth daily for 5 days. 12/09/19  12/14/19  Neaveh Belanger C, PA-C  budesonide (PULMICORT) 0.25 MG/2ML nebulizer solution Take 2 mLs (0.25 mg total) by nebulization 2 (two) times daily. 07/26/19 12/09/19  Geradine Girt, DO  escitalopram (LEXAPRO) 10 MG tablet Take 1 tablet (10 mg total) by mouth daily. 02/27/19 07/18/19  Loura Halt A, NP  fluticasone (FLOVENT HFA) 110 MCG/ACT inhaler Take 2 puffs twice daily for 1-2 weeks when in your yellow zone. 11/01/18 07/18/19  Shelda Pal, DO  ipratropium-albuterol (DUONEB) 0.5-2.5 (3) MG/3ML SOLN Take 3 mLs by nebulization 4 (four) times daily for 7 days. Patient not taking: Reported on 11/08/2019 07/26/19 12/09/19  Geradine Girt, DO  levocetirizine (XYZAL) 5 MG tablet Take 1 tablet (5 mg total) by mouth every evening. 11/01/18 02/27/19  Shelda Pal, DO  pantoprazole (PROTONIX) 40 MG tablet Take 1 tablet (40 mg total) by mouth 2 (two) times daily for 7 days. Patient not taking: Reported on 11/08/2019 07/26/19 12/09/19  Geradine Girt, DO  sucralfate (CARAFATE) 1 G tablet Take 1 tablet (1 g total) by mouth 4 (four) times daily. 06/08/11 10/20/11  Noemi Chapel, MD    Family History Family History  Problem Relation Age of Onset  . Cancer Other   . Hypertension Other   . Clotting disorder Mother   . Congestive Heart Failure Father   .  Kidney disease Father   . Cancer Father     Social History Social History   Tobacco Use  . Smoking status: Current Every Day Smoker    Packs/day: 1.00    Types: Cigarettes  . Smokeless tobacco: Never Used  Substance Use Topics  . Alcohol use: Yes    Comment: weekends - liquor  . Drug use: Never     Allergies   Shellfish-derived products and Orange concentrate [flavoring agent]   Review of Systems Review of Systems  Constitutional: Negative for activity change, appetite change, chills, fatigue and fever.  HENT: Positive for congestion, rhinorrhea and sore throat. Negative for ear pain, sinus pressure and trouble  swallowing.   Eyes: Negative for discharge and redness.  Respiratory: Positive for cough, shortness of breath and wheezing. Negative for chest tightness.   Cardiovascular: Negative for chest pain.  Gastrointestinal: Negative for abdominal pain, diarrhea, nausea and vomiting.  Genitourinary: Negative for difficulty urinating, discharge, frequency and testicular pain.  Musculoskeletal: Negative for myalgias.  Skin: Negative for rash.  Neurological: Negative for dizziness, light-headedness and headaches.     Physical Exam Triage Vital Signs ED Triage Vitals  Enc Vitals Group     BP 12/09/19 1622 116/90     Pulse Rate 12/09/19 1622 66     Resp 12/09/19 1622 16     Temp 12/09/19 1622 99 F (37.2 C)     Temp Source 12/09/19 1622 Oral     SpO2 12/09/19 1622 97 %     Weight --      Height --      Head Circumference --      Peak Flow --      Pain Score 12/09/19 1629 7     Pain Loc --      Pain Edu? --      Excl. in [place]? --    No data found.  Updated Vital Signs BP 116/90 (BP Location: Left Arm)   Pulse 66   Temp 99 F (37.2 C) (Oral)   Resp 16   SpO2 97%   Visual Acuity Right Eye Distance:   Left Eye Distance:   Bilateral Distance:    Right Eye Near:   Left Eye Near:    Bilateral Near:     Physical Exam Vitals and nursing note reviewed.  Constitutional:      Appearance: He is well-developed.     Comments: No acute distress  HENT:     Head: Normocephalic and atraumatic.     Ears:     Comments: Bilateral ears without tenderness to palpation of external auricle, tragus and mastoid, EAC's without erythema or swelling, TM's with good bony landmarks and cone of light. Non erythematous.     Nose: Nose normal.     Mouth/Throat:     Comments: Oral mucosa pink and moist, no tonsillar enlargement or exudate. Posterior pharynx patent and nonerythematous, no uvula deviation or swelling. Normal phonation. Eyes:     Conjunctiva/sclera: Conjunctivae normal.  Cardiovascular:      Rate and Rhythm: Normal rate.  Pulmonary:     Effort: Pulmonary effort is normal. No respiratory distress.     Breath sounds: Wheezing present.     Comments: Breathing comfortably at rest, occasional cough noted in room, inconsistent faint end expiratory wheezing noted Abdominal:     General: There is no distension.  Musculoskeletal:        General: Normal range of motion.     Cervical back: Neck supple.  Skin:  General: Skin is warm and dry.  Neurological:     Mental Status: He is alert and oriented to person, place, and time.      UC Treatments / Results  Labs (all labs ordered are listed, but only abnormal results are displayed) Labs Reviewed  SARS CORONAVIRUS 2 (TAT 6-24 HRS)  HIV ANTIBODY (ROUTINE TESTING W REFLEX)  RPR  CYTOLOGY, (ORAL, ANAL, URETHRAL) ANCILLARY ONLY    EKG   Radiology No results found.  Procedures Procedures (including critical care time)  Medications Ordered in UC Medications - No data to display  Initial Impression / Assessment and Plan / UC Course  I have reviewed the triage vital signs and the nursing notes.  Pertinent labs & imaging results that were available during my care of the patient were reviewed by me and considered in my medical decision making (see chart for details).     URI symptoms x2 days, recommending Flonase and Tessalon for congestion and cough, course of prednisone for asthma exacerbation and albuterol inhaler refilled.  Covid test pending.  STD screening pending, will call with results and provide treatment as needed.  Currently asymptomatic, deferring any treatment at this time.  Discussed strict return precautions. Patient verbalized understanding and is agreeable with plan.  Final Clinical Impressions(s) / UC Diagnoses   Final diagnoses:  Viral URI with cough  Screen for STD (sexually transmitted disease)     Discharge Instructions     Covid test and STD screening pending, monitor my chart for  results Please use albuterol inhaler as needed for shortness of breath, wheezing Begin prednisone daily for the next 5 days-take in morning with food to further help with wheezing and inflammation in chest Tessalon/benzonatate every 8 hours as needed for cough Flonase nasal spray 1 to 2 spray in each nostril daily Rest and drink plenty of fluids  Please follow-up if any symptoms not improving or worsening   ED Prescriptions    Medication Sig Dispense Auth. Provider   albuterol (VENTOLIN HFA) 108 (90 Base) MCG/ACT inhaler Inhale 1-2 puffs into the lungs every 6 (six) hours as needed for wheezing or shortness of breath. 18 g Zamiya Dillard C, PA-C   predniSONE (DELTASONE) 50 MG tablet Take 1 tablet (50 mg total) by mouth daily for 5 days. 5 tablet Sarahi Borland C, PA-C   benzonatate (TESSALON) 200 MG capsule Take 1 capsule (200 mg total) by mouth 3 (three) times daily as needed for up to 7 days for cough. 28 capsule Batul Diego C, PA-C   fluticasone (FLONASE) 50 MCG/ACT nasal spray Place 1-2 sprays into both nostrils daily for 7 days. 1 g Arieh Bogue, [place] C, PA-C     PDMP not reviewed this encounter.   Lew Dawes, [place] 12/10/19 [phone]

## 2019-12-11 LAB — CYTOLOGY, (ORAL, ANAL, URETHRAL) ANCILLARY ONLY
Chlamydia: POSITIVE — AB
Comment: NEGATIVE
Comment: NEGATIVE
Comment: NORMAL
Neisseria Gonorrhea: NEGATIVE
Trichomonas: NEGATIVE

## 2019-12-12 ENCOUNTER — Telehealth (HOSPITAL_COMMUNITY): Payer: Self-pay | Admitting: Orthopedic Surgery

## 2019-12-12 MED ORDER — AZITHROMYCIN 250 MG PO TABS: 1000.0000 mg | ORAL_TABLET | Freq: Once | ORAL | 0 refills | Status: AC

## 2019-12-13 ENCOUNTER — Emergency Department (HOSPITAL_COMMUNITY)
Admission: EM | Admit: 2019-12-13 | Discharge: 2019-12-13 | Disposition: A | Discharge status: Home / Self Care | Payer: Medicare Other | Attending: Emergency Medicine | Admitting: Emergency Medicine

## 2019-12-13 DIAGNOSIS — Z5321 Procedure and treatment not carried out due to patient leaving prior to being seen by health care provider: Secondary | ICD-10-CM | POA: Diagnosis not present

## 2019-12-13 DIAGNOSIS — R0989 Other specified symptoms and signs involving the circulatory and respiratory systems: Secondary | ICD-10-CM | POA: Insufficient documentation

## 2019-12-13 NOTE — ED Notes (Signed)
Pt called no response

## 2019-12-20 ENCOUNTER — Encounter (HOSPITAL_COMMUNITY): Payer: Self-pay

## 2019-12-20 ENCOUNTER — Ambulatory Visit (HOSPITAL_COMMUNITY)
Admission: EM | Admit: 2019-12-20 | Discharge: 2019-12-20 | Disposition: A | Discharge status: Home / Self Care | Payer: Medicare Other | Attending: Urgent Care | Admitting: Urgent Care

## 2019-12-20 DIAGNOSIS — M25512 Pain in left shoulder: Secondary | ICD-10-CM | POA: Diagnosis not present

## 2019-12-20 DIAGNOSIS — M545 Low back pain, unspecified: Secondary | ICD-10-CM

## 2019-12-20 DIAGNOSIS — M542 Cervicalgia: Secondary | ICD-10-CM | POA: Diagnosis not present

## 2019-12-20 DIAGNOSIS — M25532 Pain in left wrist: Secondary | ICD-10-CM

## 2019-12-20 DIAGNOSIS — M79642 Pain in left hand: Secondary | ICD-10-CM | POA: Diagnosis not present

## 2019-12-20 MED ORDER — TIZANIDINE HCL 4 MG PO TABS: 4.0000 mg | ORAL_TABLET | Freq: Three times a day (TID) | ORAL | 0 refills | Status: DC | PRN

## 2019-12-20 MED ORDER — NAPROXEN 500 MG PO TABS: 500.0000 mg | ORAL_TABLET | Freq: Two times a day (BID) | ORAL | 0 refills | Status: DC
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
## 2019-12-22 ENCOUNTER — Other Ambulatory Visit: Payer: Self-pay

## 2019-12-22 ENCOUNTER — Encounter (HOSPITAL_COMMUNITY): Payer: Self-pay

## 2019-12-22 ENCOUNTER — Ambulatory Visit (HOSPITAL_COMMUNITY)
Admission: EM | Admit: 2019-12-22 | Discharge: 2019-12-22 | Disposition: A | Discharge status: Home / Self Care | Payer: Medicare Other | Attending: Internal Medicine | Admitting: Internal Medicine

## 2019-12-22 DIAGNOSIS — Z202 Contact with and (suspected) exposure to infections with a predominantly sexual mode of transmission: Secondary | ICD-10-CM | POA: Insufficient documentation

## 2019-12-22 MED ORDER — AZITHROMYCIN 250 MG PO TABS
ORAL_TABLET | ORAL | Status: AC
  Filled 2019-12-22: qty 4

## 2019-12-22 MED ORDER — AZITHROMYCIN 250 MG PO TABS
1000.0000 mg | ORAL_TABLET | Freq: Once | ORAL | Status: AC
  Administered 2019-12-22: 1000 mg via ORAL

## 2019-12-22 NOTE — ED Triage Notes (Signed)
Pt states his girlfriend tested + for chlamydia and he needs tx. Pt c/o pelvic pain.

## 2019-12-22 NOTE — ED Provider Notes (Signed)
[facility]    CSN: [id number] Arrival date & time: 12/22/19  1113      History   Chief Complaint Chief Complaint  Patient presents with  . STI tx    HPI GEN CLAGG is a 34 y.o. male comes to the urgent care for chlamydia testing.  Patient's partner informed him that she tested positive for chlamydia.  Patient denies any urinary symptoms or penile discharge.  No scrotal pain.Marland Kitchen   HPI  Past Medical History:  Diagnosis Date  . Anxiety   . Asthma   . Bradycardia   . Seasonal allergies     Patient Active Problem List   Diagnosis Date Noted  . Acute asthma exacerbation 07/23/2019  . Emesis 07/23/2019  . Asthma exacerbation 07/23/2019    Past Surgical History:  Procedure Laterality Date  . DENTAL SURGERY         Home Medications    Prior to Admission medications   Medication Sig Start Date End Date Taking? Authorizing Provider  albuterol (VENTOLIN HFA) 108 (90 Base) MCG/ACT inhaler Inhale 1-2 puffs into the lungs every 6 (six) hours as needed for wheezing or shortness of breath. 12/09/19   Wieters, Hallie C, PA-C  fluticasone (FLONASE) 50 MCG/ACT nasal spray Place 1-2 sprays into both nostrils daily for 7 days. 12/09/19 12/16/19  Wieters, Hallie C, PA-C  naproxen (NAPROSYN) 500 MG tablet Take 1 tablet (500 mg total) by mouth 2 (two) times daily with a meal. 12/20/19   Wallis Bamberg, PA-C  tiZANidine (ZANAFLEX) 4 MG tablet Take 1 tablet (4 mg total) by mouth every 8 (eight) hours as needed. 12/20/19   Wallis Bamberg, PA-C  budesonide (PULMICORT) 0.25 MG/2ML nebulizer solution Take 2 mLs (0.25 mg total) by nebulization 2 (two) times daily. 07/26/19 12/09/19  Joseph Art, DO  escitalopram (LEXAPRO) 10 MG tablet Take 1 tablet (10 mg total) by mouth daily. 02/27/19 07/18/19  Dahlia Byes A, NP  fluticasone (FLOVENT HFA) 110 MCG/ACT inhaler Take 2 puffs twice daily for 1-2 weeks when in your yellow zone. 11/01/18 07/18/19  Sharlene Dory, DO    ipratropium-albuterol (DUONEB) 0.5-2.5 (3) MG/3ML SOLN Take 3 mLs by nebulization 4 (four) times daily for 7 days. Patient not taking: Reported on 11/08/2019 07/26/19 12/09/19  Joseph Art, DO  levocetirizine (XYZAL) 5 MG tablet Take 1 tablet (5 mg total) by mouth every evening. 11/01/18 02/27/19  Sharlene Dory, DO  pantoprazole (PROTONIX) 40 MG tablet Take 1 tablet (40 mg total) by mouth 2 (two) times daily for 7 days. Patient not taking: Reported on 11/08/2019 07/26/19 12/09/19  Joseph Art, DO  sucralfate (CARAFATE) 1 G tablet Take 1 tablet (1 g total) by mouth 4 (four) times daily. 06/08/11 10/20/11  Eber Hong, MD    Family History Family History  Problem Relation Age of Onset  . Cancer Other   . Hypertension Other   . Clotting disorder Mother   . Congestive Heart Failure Father   . Kidney disease Father   . Cancer Father     Social History Social History   Tobacco Use  . Smoking status: Current Every Day Smoker    Packs/day: 1.00    Types: Cigarettes  . Smokeless tobacco: Never Used  Substance Use Topics  . Alcohol use: Yes    Alcohol/week: 4.0 standard drinks    Types: 4 Shots of liquor per week  . Drug use: Never     Allergies   Shellfish-derived products and Orange  concentrate [flavoring agent]   Review of Systems Review of Systems  Constitutional: Negative.   Gastrointestinal: Negative.   Genitourinary: Negative.  Negative for penile swelling, scrotal swelling and testicular pain.  Musculoskeletal: Negative.      Physical Exam Triage Vital Signs ED Triage Vitals  Enc Vitals Group     BP 12/22/19 1151 111/75     Pulse Rate 12/22/19 1151 62     Resp 12/22/19 1151 16     Temp 12/22/19 1151 98 F (36.7 C)     Temp Source 12/22/19 1151 Oral     SpO2 12/22/19 1151 98 %     Weight 12/22/19 1152 134 lb (60.8 kg)     Height 12/22/19 1152 5\' 4"  (1.626 m)     Head Circumference --      Peak Flow --      Pain Score 12/22/19 1151 3     Pain  Loc --      Pain Edu? --      Excl. in [place]? --    No data found.  Updated Vital Signs BP 111/75   Pulse 62   Temp 98 F (36.7 C) (Oral)   Resp 16   Ht 5\' 4"  (1.626 m)   Wt 60.8 kg   SpO2 98%   BMI 23.00 kg/m   Visual Acuity Right Eye Distance:   Left Eye Distance:   Bilateral Distance:    Right Eye Near:   Left Eye Near:    Bilateral Near:     Physical Exam Vitals and nursing note reviewed.  Pulmonary:     Effort: Pulmonary effort is normal.     Breath sounds: Normal breath sounds.  Genitourinary:    Penis: Normal.      Testes: Normal.  Musculoskeletal:        General: No swelling, tenderness or signs of injury. Normal range of motion.      UC Treatments / Results  Labs (all labs ordered are listed, but only abnormal results are displayed) Labs Reviewed  CYTOLOGY, (ORAL, ANAL, URETHRAL) ANCILLARY ONLY    EKG   Radiology No results found.  Procedures Procedures (including critical care time)  Medications Ordered in UC Medications  azithromycin (ZITHROMAX) tablet 1,000 mg (1,000 mg Oral Given 12/22/19 1219)    Initial Impression / Assessment and Plan / UC Course  I have reviewed the triage vital signs and the nursing notes.  Pertinent labs & imaging results that were available during my care of the patient were reviewed by me and considered in my medical decision making (see chart for details).    1.  Exposure to chlamydia: Azithromycin 1 g x 1 dose given GC/chlamydia/trichomonas sent We will call patient if lab results require treatment plan updated. Final Clinical Impressions(s) / UC Diagnoses   Final diagnoses:  Exposure to chlamydia   Discharge Instructions   None    ED Prescriptions    None     PDMP not reviewed this encounter.   Chase Picket, MD 12/22/19 1626

## 2019-12-27 LAB — CYTOLOGY, (ORAL, ANAL, URETHRAL) ANCILLARY ONLY
Chlamydia: POSITIVE — AB
Comment: NEGATIVE
Comment: NEGATIVE
Comment: NORMAL
Neisseria Gonorrhea: NEGATIVE
Trichomonas: NEGATIVE

## 2020-01-23 ENCOUNTER — Emergency Department (HOSPITAL_COMMUNITY): Payer: Medicare Other

## 2020-01-23 ENCOUNTER — Encounter (HOSPITAL_COMMUNITY): Payer: Self-pay | Admitting: *Deleted

## 2020-01-23 ENCOUNTER — Emergency Department (HOSPITAL_COMMUNITY)
Admission: EM | Admit: 2020-01-23 | Discharge: 2020-01-23 | Disposition: A | Discharge status: Home / Self Care | Payer: Medicare Other | Attending: Emergency Medicine | Admitting: Emergency Medicine

## 2020-01-23 ENCOUNTER — Other Ambulatory Visit: Payer: Self-pay

## 2020-01-23 DIAGNOSIS — F17210 Nicotine dependence, cigarettes, uncomplicated: Secondary | ICD-10-CM | POA: Diagnosis not present

## 2020-01-23 DIAGNOSIS — Z20822 Contact with and (suspected) exposure to covid-19: Secondary | ICD-10-CM | POA: Diagnosis not present

## 2020-01-23 DIAGNOSIS — R0602 Shortness of breath: Secondary | ICD-10-CM | POA: Diagnosis not present

## 2020-01-23 DIAGNOSIS — J4541 Moderate persistent asthma with (acute) exacerbation: Secondary | ICD-10-CM | POA: Diagnosis not present

## 2020-01-23 DIAGNOSIS — R000 Tachycardia, unspecified: Secondary | ICD-10-CM | POA: Diagnosis not present

## 2020-01-23 LAB — COMPREHENSIVE METABOLIC PANEL
ALT: 24 U/L (ref 0–44)
AST: 31 U/L (ref 15–41)
Albumin: 4 g/dL (ref 3.5–5.0)
Alkaline Phosphatase: 65 U/L (ref 38–126)
Anion gap: 13 (ref 5–15)
BUN: 11 mg/dL (ref 6–20)
CO2: 17 mmol/L — ABNORMAL LOW (ref 22–32)
Calcium: 9.3 mg/dL (ref 8.9–10.3)
Chloride: 109 mmol/L (ref 98–111)
Creatinine, Ser: 1.07 mg/dL (ref 0.61–1.24)
GFR calc Af Amer: >60 mL/min (ref >60)
GFR calc non Af Amer: >60 mL/min (ref >60)
Glucose, Bld: 120 mg/dL — ABNORMAL HIGH (ref 70–99)
Potassium: 3.8 mmol/L (ref 3.5–5.1)
Sodium: 139 mmol/L (ref 135–145)
Total Bilirubin: 0.7 mg/dL (ref 0.3–1.2)
Total Protein: 6.7 g/dL (ref 6.5–8.1)

## 2020-01-23 LAB — CBC WITH DIFFERENTIAL/PLATELET
Abs Immature Granulocytes: 0.01 10*3/uL (ref 0.00–0.07)
Basophils Absolute: 0 10*3/uL (ref 0.0–0.1)
Basophils Relative: 1 %
Eosinophils Absolute: 0.2 10*3/uL (ref 0.0–0.5)
Eosinophils Relative: 3 %
HCT: 45.4 % (ref 39.0–52.0)
Hemoglobin: 15.6 g/dL (ref 13.0–17.0)
Immature Granulocytes: 0 %
Lymphocytes Relative: 29 %
Lymphs Abs: 1.5 10*3/uL (ref 0.7–4.0)
MCH: 29.5 pg (ref 26.0–34.0)
MCHC: 34.4 g/dL (ref 30.0–36.0)
MCV: 85.8 fL (ref 80.0–100.0)
Monocytes Absolute: 0.8 10*3/uL (ref 0.1–1.0)
Monocytes Relative: 16 %
Neutro Abs: 2.7 10*3/uL (ref 1.7–7.7)
Neutrophils Relative %: 51 %
Platelets: 208 10*3/uL (ref 150–400)
RBC: 5.29 MIL/uL (ref 4.22–5.81)
RDW: 13.4 % (ref 11.5–15.5)
WBC: 5.2 10*3/uL (ref 4.0–10.5)
nRBC: 0 % (ref 0.0–0.2)

## 2020-01-23 LAB — SARS CORONAVIRUS 2 BY RT PCR (HOSPITAL ORDER, PERFORMED IN ~~LOC~~ HOSPITAL LAB): SARS Coronavirus 2: NEGATIVE

## 2020-01-23 MED ORDER — PREDNISONE 20 MG PO TABS
ORAL_TABLET | ORAL | 0 refills | Status: DC
Start: 2020-01-23 — End: 2020-05-08

## 2020-01-23 MED ORDER — SODIUM CHLORIDE 0.9 % IV BOLUS
1000.0000 mL | Freq: Once | INTRAVENOUS | Status: AC
  Administered 2020-01-23: 1000 mL via INTRAVENOUS

## 2020-01-23 MED ORDER — MAGNESIUM SULFATE 2 GM/50ML IV SOLN
2.0000 g | Freq: Once | INTRAVENOUS | Status: AC
  Administered 2020-01-23: 2 g via INTRAVENOUS
  Filled 2020-01-23: qty 50

## 2020-01-23 MED ORDER — METHYLPREDNISOLONE SODIUM SUCC 125 MG IJ SOLR
125.0000 mg | Freq: Once | INTRAMUSCULAR | Status: AC
  Administered 2020-01-23: 125 mg via INTRAVENOUS
  Filled 2020-01-23: qty 2

## 2020-01-23 MED ORDER — ALBUTEROL SULFATE (2.5 MG/3ML) 0.083% IN NEBU
5.0000 mg | INHALATION_SOLUTION | Freq: Once | RESPIRATORY_TRACT | Status: AC
  Administered 2020-01-23: 5 mg via RESPIRATORY_TRACT
  Filled 2020-01-23: qty 6

## 2020-01-23 MED ORDER — ALBUTEROL SULFATE (2.5 MG/3ML) 0.083% IN NEBU: 2.5000 mg | INHALATION_SOLUTION | Freq: Once | RESPIRATORY_TRACT | Status: DC | PRN

## 2020-01-23 NOTE — ED Triage Notes (Signed)
To ED for eval of sob and wheezing for past 5 days. Pt states 'can I get some air'. Unable to talk if complete sentences. Unable to take a full breath. Diaphoretic.

## 2020-01-23 NOTE — ED Provider Notes (Signed)
[name] EMERGENCY DEPARTMENT Provider Note   CSN: [id number] Arrival date & time: 01/23/20  9562  LEVEL 5 CAVEAT - RESPIRATORY DISTRESS History Chief Complaint  Patient presents with  . Shortness of Breath    Kenneth Lane is a 34 y.o. male.  HPI 34 year old male presents with acute shortness of breath.  Ongoing for about 5 days.  The history is limited as the patient is having increased work of breathing and does not talk but answers by nodding or using fingers.  He has been having shortness of breath like prior asthma.  Has been using his nebulizer.  No fevers.  A little bit of left-sided chest pain.  Has received his Covid vaccination.  Denies being around sick contacts.   Past Medical History:  Diagnosis Date  . Anxiety   . Asthma   . Bradycardia   . Seasonal allergies     Patient Active Problem List   Diagnosis Date Noted  . Acute asthma exacerbation 07/23/2019  . Emesis 07/23/2019  . Asthma exacerbation 07/23/2019    Past Surgical History:  Procedure Laterality Date  . DENTAL SURGERY         Family History  Problem Relation Age of Onset  . Cancer Other   . Hypertension Other   . Clotting disorder Mother   . Congestive Heart Failure Father   . Kidney disease Father   . Cancer Father     Social History   Tobacco Use  . Smoking status: Current Every Day Smoker    Packs/day: 1.00    Types: Cigarettes  . Smokeless tobacco: Never Used  Vaping Use  . Vaping Use: Never used  Substance Use Topics  . Alcohol use: Yes    Alcohol/week: 4.0 standard drinks    Types: 4 Shots of liquor per week  . Drug use: Never    Home Medications Prior to Admission medications   Medication Sig Start Date End Date Taking? Authorizing Provider  albuterol (VENTOLIN HFA) 108 (90 Base) MCG/ACT inhaler Inhale 1-2 puffs into the lungs every 6 (six) hours as needed for wheezing or shortness of breath. 12/09/19  Yes Wieters, Hallie C, PA-C  fluticasone  (FLONASE) 50 MCG/ACT nasal spray Place 1-2 sprays into both nostrils daily for 7 days. 12/09/19 01/23/20 Yes Wieters, Hallie C, PA-C  naproxen (NAPROSYN) 500 MG tablet Take 1 tablet (500 mg total) by mouth 2 (two) times daily with a meal. Patient not taking: Reported on 01/23/2020 12/20/19   Wallis Bamberg, PA-C  predniSONE (DELTASONE) 20 MG tablet 2 tabs po daily x 4 days 01/23/20   Pricilla Loveless, MD  tiZANidine (ZANAFLEX) 4 MG tablet Take 1 tablet (4 mg total) by mouth every 8 (eight) hours as needed. Patient not taking: Reported on 01/23/2020 12/20/19   Wallis Bamberg, PA-C  budesonide (PULMICORT) 0.25 MG/2ML nebulizer solution Take 2 mLs (0.25 mg total) by nebulization 2 (two) times daily. 07/26/19 12/09/19  Joseph Art, DO  escitalopram (LEXAPRO) 10 MG tablet Take 1 tablet (10 mg total) by mouth daily. 02/27/19 07/18/19  Dahlia Byes A, NP  fluticasone (FLOVENT HFA) 110 MCG/ACT inhaler Take 2 puffs twice daily for 1-2 weeks when in your yellow zone. 11/01/18 07/18/19  Sharlene Dory, DO  ipratropium-albuterol (DUONEB) 0.5-2.5 (3) MG/3ML SOLN Take 3 mLs by nebulization 4 (four) times daily for 7 days. Patient not taking: Reported on 11/08/2019 07/26/19 12/09/19  Joseph Art, DO  levocetirizine (XYZAL) 5 MG tablet Take 1 tablet (5 mg  total) by mouth every evening. 11/01/18 02/27/19  Sharlene Dory, DO  pantoprazole (PROTONIX) 40 MG tablet Take 1 tablet (40 mg total) by mouth 2 (two) times daily for 7 days. Patient not taking: Reported on 11/08/2019 07/26/19 12/09/19  Joseph Art, DO  sucralfate (CARAFATE) 1 G tablet Take 1 tablet (1 g total) by mouth 4 (four) times daily. 06/08/11 10/20/11  Eber Hong, MD    Allergies    Shellfish-derived products and Orange concentrate [flavoring agent]  Review of Systems   Review of Systems  Unable to perform ROS: Severe respiratory distress    Physical Exam Updated Vital Signs BP 121/88   Pulse 66   Temp (!) 96.6 F (35.9 C) (Axillary)    Resp 12   Ht 5\' 4"  (1.626 m)   Wt 61.7 kg   SpO2 100%   BMI 23.34 kg/m   Physical Exam Vitals and nursing note reviewed.  Constitutional:      Appearance: He is well-developed.  HENT:     Head: Normocephalic and atraumatic.     Right Ear: External ear normal.     Left Ear: External ear normal.     Nose: Nose normal.  Eyes:     General:        Right eye: No discharge.        Left eye: No discharge.  Cardiovascular:     Rate and Rhythm: Regular rhythm. Tachycardia present.     Heart sounds: Normal heart sounds.  Pulmonary:     Effort: Tachypnea and accessory muscle usage present.     Breath sounds: Wheezing present.  Abdominal:     Palpations: Abdomen is soft.     Tenderness: There is no abdominal tenderness.  Musculoskeletal:     Cervical back: Neck supple.  Skin:    General: Skin is warm and dry.  Neurological:     Mental Status: He is alert.  Psychiatric:        Mood and Affect: Mood is not anxious.     ED Results / Procedures / Treatments   Labs (all labs ordered are listed, but only abnormal results are displayed) Labs Reviewed  COMPREHENSIVE METABOLIC PANEL - Abnormal; Notable for the following components:      Result Value   CO2 17 (*)    Glucose, Bld 120 (*)    All other components within normal limits  SARS CORONAVIRUS 2 BY RT PCR [facility] ORDER, PERFORMED IN [facility])  CBC WITH DIFFERENTIAL/PLATELET    EKG EKG Interpretation  Date/Time:  Tuesday January 23 2020 10:10:21 EDT Ventricular Rate:  69 PR Interval:    QRS Duration: 97 QT Interval:  393 QTC Calculation: 421 R Axis:   56 Text Interpretation: Sinus rhythm RSR' in V1 or V2, right VCD or RVH Borderline T abnormalities, inferior leads no significant change since Dec 2020 Confirmed by Jan 2021 [phone]) on 01/23/2020 10:14:21 AM   Radiology DG Chest Portable 1 View  Result Date: 01/23/2020 CLINICAL DATA:  Shortness of breath EXAM: PORTABLE CHEST 1 VIEW COMPARISON:   11/08/2019 FINDINGS: The heart size and mediastinal contours are within normal limits. No new consolidation or edema. No pleural effusion. The visualized skeletal structures are unremarkable. IMPRESSION: No acute process in the chest. Electronically Signed   By: 11/10/2019 M.D.   On: 01/23/2020 10:29    Procedures Procedures (including critical care time)  Medications Ordered in ED Medications  albuterol (PROVENTIL) (2.5 MG/3ML) 0.083% nebulizer solution 2.5 mg (has  no administration in time range)  albuterol (PROVENTIL) (2.5 MG/3ML) 0.083% nebulizer solution 5 mg (5 mg Nebulization Given 01/23/20 1007)  methylPREDNISolone sodium succinate (SOLU-MEDROL) 125 mg/2 mL injection 125 mg (125 mg Intravenous Given 01/23/20 1017)  magnesium sulfate IVPB 2 g 50 mL (0 g Intravenous Stopped 01/23/20 1117)  sodium chloride 0.9 % bolus 1,000 mL (0 mLs Intravenous Stopped 01/23/20 1117)    ED Course  I have reviewed the triage vital signs and the nursing notes.  Pertinent labs & imaging results that were available during my care of the patient were reviewed by me and considered in my medical decision making (see chart for details).    MDM Rules/Calculators/A&P                          Patient was given albuterol nebulizer, Solu-Medrol and magnesium in the ER.  He is now significantly improved.  I offered more albuterol as he is still having some wheezing but he states he feels much better and would like to go.  I think this is reasonable.  Appears to have an asthma exacerbation, doubt pneumonia on top of this.  He appears stable for discharge home with return precautions and steroid burst. Final Clinical Impression(s) / ED Diagnoses Final diagnoses:  Moderate persistent asthma with exacerbation    Rx / DC Orders ED Discharge Orders         Ordered    predniSONE (DELTASONE) 20 MG tablet     Discontinue  Reprint     01/23/20 1217           Pricilla Loveless, MD 01/23/20 1217

## 2020-01-23 NOTE — Discharge Instructions (Signed)
Use your albuterol inhaler or nebulizer every 4 hours for the next 48 hours.  If you need it significantly more than this, call 911 or return to the ER for evaluation.  Start your prednisone tomorrow.  You were given your first dose of steroids in the ER.

## 2020-03-07 ENCOUNTER — Other Ambulatory Visit: Payer: Self-pay

## 2020-03-07 ENCOUNTER — Emergency Department (HOSPITAL_COMMUNITY): Payer: Medicare Other

## 2020-03-07 ENCOUNTER — Emergency Department (HOSPITAL_COMMUNITY)
Admission: EM | Admit: 2020-03-07 | Discharge: 2020-03-07 | Disposition: A | Discharge status: Home / Self Care | Payer: Medicare Other | Attending: Emergency Medicine | Admitting: Emergency Medicine

## 2020-03-07 DIAGNOSIS — J45909 Unspecified asthma, uncomplicated: Secondary | ICD-10-CM | POA: Diagnosis not present

## 2020-03-07 DIAGNOSIS — Z20822 Contact with and (suspected) exposure to covid-19: Secondary | ICD-10-CM | POA: Diagnosis not present

## 2020-03-07 DIAGNOSIS — F419 Anxiety disorder, unspecified: Secondary | ICD-10-CM | POA: Diagnosis not present

## 2020-03-07 DIAGNOSIS — Z5321 Procedure and treatment not carried out due to patient leaving prior to being seen by health care provider: Secondary | ICD-10-CM | POA: Diagnosis not present

## 2020-03-07 DIAGNOSIS — R0602 Shortness of breath: Secondary | ICD-10-CM | POA: Diagnosis not present

## 2020-03-07 LAB — SARS CORONAVIRUS 2 BY RT PCR (HOSPITAL ORDER, PERFORMED IN ~~LOC~~ HOSPITAL LAB): SARS Coronavirus 2: NEGATIVE

## 2020-03-07 MED ORDER — ALBUTEROL SULFATE HFA 108 (90 BASE) MCG/ACT IN AERS
2.0000 | INHALATION_SPRAY | Freq: Once | RESPIRATORY_TRACT | Status: AC
  Administered 2020-03-07: 2 via RESPIRATORY_TRACT
  Filled 2020-03-07: qty 6.7

## 2020-03-07 NOTE — ED Triage Notes (Addendum)
Pt c/o anxiety attack and SOB that awoke him from a dead sleep. States that this affects his asthma. States used his inhaler and neb machine but doesn't feel better; just jittery. Pt has dry cough in triage.

## 2020-03-07 NOTE — ED Notes (Signed)
Called pt.x3 for reasses vitalsigns no answer

## 2020-05-08 ENCOUNTER — Encounter (HOSPITAL_COMMUNITY): Payer: Self-pay

## 2020-05-08 ENCOUNTER — Other Ambulatory Visit: Payer: Self-pay

## 2020-05-08 ENCOUNTER — Ambulatory Visit (HOSPITAL_COMMUNITY)
Admission: EM | Admit: 2020-05-08 | Discharge: 2020-05-08 | Disposition: A | Discharge status: Home / Self Care | Payer: Medicare Other | Attending: Internal Medicine | Admitting: Internal Medicine

## 2020-05-08 DIAGNOSIS — Z20822 Contact with and (suspected) exposure to covid-19: Secondary | ICD-10-CM | POA: Insufficient documentation

## 2020-05-08 DIAGNOSIS — F419 Anxiety disorder, unspecified: Secondary | ICD-10-CM | POA: Insufficient documentation

## 2020-05-08 DIAGNOSIS — J45901 Unspecified asthma with (acute) exacerbation: Secondary | ICD-10-CM | POA: Diagnosis not present

## 2020-05-08 DIAGNOSIS — R059 Cough, unspecified: Secondary | ICD-10-CM | POA: Diagnosis not present

## 2020-05-08 DIAGNOSIS — Z79899 Other long term (current) drug therapy: Secondary | ICD-10-CM | POA: Insufficient documentation

## 2020-05-08 DIAGNOSIS — F17210 Nicotine dependence, cigarettes, uncomplicated: Secondary | ICD-10-CM | POA: Diagnosis not present

## 2020-05-08 DIAGNOSIS — Z113 Encounter for screening for infections with a predominantly sexual mode of transmission: Secondary | ICD-10-CM | POA: Diagnosis not present

## 2020-05-08 LAB — SARS CORONAVIRUS 2 (TAT 6-24 HRS): SARS Coronavirus 2: NEGATIVE

## 2020-05-08 LAB — HIV ANTIBODY (ROUTINE TESTING W REFLEX): HIV Screen 4th Generation wRfx: NONREACTIVE

## 2020-05-08 NOTE — ED Triage Notes (Signed)
Patient in requesting covid test. Has a dry cough that started yesterday.    Has not had medication for cough   Denies fever, n.v, diarrhea, congestion, runny nose, st,sob, chest pain or other uri sxs  Also wants to be tested for STI  Denies dysuria, penile irritation, discharge, fever, or other sxs

## 2020-05-08 NOTE — Discharge Instructions (Addendum)
Recommend allergy medicine daily. This may be allergy induced. Zyrtec OTC.  Recommend mucinex OTC for cough.  Inhaler as  needed  Follow up as needed for continued or worsening symptoms  You can check your my chart for results.

## 2020-05-09 LAB — RPR: RPR Ser Ql: NONREACTIVE

## 2020-05-09 NOTE — ED Provider Notes (Signed)
[facility]    CSN: [id number] Arrival date & time: 05/08/20  7829      History   Chief Complaint Chief Complaint  Patient presents with  . Cough    HPI JUNIOUS RAGONE is a 34 y.o. male.   Patient is a 34 year old male past medical history of anxiety, asthma, allergies.  He presents today with dry cough that started yesterday.  He has not had any medicine for the cough.  Requesting Covid testing for work.  Denies any other associated symptoms to include fever, congestion, runny nose, shortness of breath.  He would also like to be STI tested.  Denies any current symptoms to include dysuria, penile discharge, testicle pain or swelling.     Past Medical History:  Diagnosis Date  . Anxiety   . Asthma   . Bradycardia   . Seasonal allergies     Patient Active Problem List   Diagnosis Date Noted  . Acute asthma exacerbation 07/23/2019  . Emesis 07/23/2019  . Asthma exacerbation 07/23/2019    Past Surgical History:  Procedure Laterality Date  . DENTAL SURGERY         Home Medications    Prior to Admission medications   Medication Sig Start Date End Date Taking? Authorizing Provider  albuterol (VENTOLIN HFA) 108 (90 Base) MCG/ACT inhaler Inhale 1-2 puffs into the lungs every 6 (six) hours as needed for wheezing or shortness of breath. 12/09/19  Yes Wieters, Hallie C, PA-C  fluticasone (FLONASE) 50 MCG/ACT nasal spray Place 1-2 sprays into both nostrils daily for 7 days. 12/09/19 01/23/20  Wieters, Hallie C, PA-C  budesonide (PULMICORT) 0.25 MG/2ML nebulizer solution Take 2 mLs (0.25 mg total) by nebulization 2 (two) times daily. 07/26/19 12/09/19  Joseph Art, DO  escitalopram (LEXAPRO) 10 MG tablet Take 1 tablet (10 mg total) by mouth daily. 02/27/19 07/18/19  Dahlia Byes A, NP  fluticasone (FLOVENT HFA) 110 MCG/ACT inhaler Take 2 puffs twice daily for 1-2 weeks when in your yellow zone. 11/01/18 07/18/19  Sharlene Dory, DO  ipratropium-albuterol  (DUONEB) 0.5-2.5 (3) MG/3ML SOLN Take 3 mLs by nebulization 4 (four) times daily for 7 days. Patient not taking: Reported on 11/08/2019 07/26/19 12/09/19  Joseph Art, DO  levocetirizine (XYZAL) 5 MG tablet Take 1 tablet (5 mg total) by mouth every evening. 11/01/18 02/27/19  Sharlene Dory, DO  pantoprazole (PROTONIX) 40 MG tablet Take 1 tablet (40 mg total) by mouth 2 (two) times daily for 7 days. Patient not taking: Reported on 11/08/2019 07/26/19 12/09/19  Joseph Art, DO  sucralfate (CARAFATE) 1 G tablet Take 1 tablet (1 g total) by mouth 4 (four) times daily. 06/08/11 10/20/11  Eber Hong, MD    Family History Family History  Problem Relation Age of Onset  . Cancer Other   . Hypertension Other   . Clotting disorder Mother   . Congestive Heart Failure Father   . Kidney disease Father   . Cancer Father     Social History Social History   Tobacco Use  . Smoking status: Current Every Day Smoker    Packs/day: 1.00    Types: Cigarettes  . Smokeless tobacco: Never Used  Vaping Use  . Vaping Use: Never used  Substance Use Topics  . Alcohol use: Yes    Alcohol/week: 4.0 standard drinks    Types: 4 Shots of liquor per week  . Drug use: Never     Allergies   Shellfish-derived products and Orange  concentrate [flavoring agent]   Review of Systems Review of Systems   Physical Exam Triage Vital Signs ED Triage Vitals [05/08/20 0845]  Enc Vitals Group     BP (!) 111/59     Pulse Rate 62     Resp 19     Temp 98 F (36.7 C)     Temp Source Oral     SpO2 98 %     Weight      Height      Head Circumference      Peak Flow      Pain Score 0     Pain Loc      Pain Edu?      Excl. in GC?    No data found.  Updated Vital Signs BP 133/67 (BP Location: Left Arm)   Pulse 62   Temp 98 F (36.7 C) (Oral)   Resp 19   SpO2 98%   Visual Acuity Right Eye Distance:   Left Eye Distance:   Bilateral Distance:    Right Eye Near:   Left Eye Near:      Bilateral Near:     Physical Exam Vitals and nursing note reviewed.  Constitutional:      Appearance: Normal appearance.  HENT:     Head: Normocephalic and atraumatic.     Right Ear: Tympanic membrane and ear canal normal.     Left Ear: Tympanic membrane and ear canal normal.     Nose: Nose normal.     Mouth/Throat:     Pharynx: Oropharynx is clear.  Eyes:     Conjunctiva/sclera: Conjunctivae normal.  Cardiovascular:     Rate and Rhythm: Normal rate and regular rhythm.  Pulmonary:     Effort: Pulmonary effort is normal.     Breath sounds: Normal breath sounds.  Musculoskeletal:        General: Normal range of motion.     Cervical back: Normal range of motion.  Skin:    General: Skin is warm and dry.  Neurological:     Mental Status: He is alert.  Psychiatric:        Mood and Affect: Mood normal.      UC Treatments / Results  Labs (all labs ordered are listed, but only abnormal results are displayed) Labs Reviewed  SARS CORONAVIRUS 2 (TAT 6-24 HRS)  HIV ANTIBODY (ROUTINE TESTING W REFLEX)  RPR    EKG   Radiology No results found.  Procedures Procedures (including critical care time)  Medications Ordered in UC Medications - No data to display  Initial Impression / Assessment and Plan / UC Course  I have reviewed the triage vital signs and the nursing notes.  Pertinent labs & imaging results that were available during my care of the patient were reviewed by me and considered in my medical decision making (see chart for details).     Cough Most likely allergy related.  Recommended Zyrtec daily. Over-the-counter cough medicine as needed Inhaler as needed  STD screening HIV and syphilis testing done.  Patient refused swab to test for gonorrhea, chlamydia and trichomonas Final Clinical Impressions(s) / UC Diagnoses   Final diagnoses:  Cough  Screening examination for STD (sexually transmitted disease)     Discharge Instructions     Recommend  allergy medicine daily. This may be allergy induced. Zyrtec OTC.  Recommend mucinex OTC for cough.  Inhaler as  needed  Follow up as needed for continued or worsening symptoms  You can check your  my chart for results.     ED Prescriptions    None     PDMP not reviewed this encounter.   Janace Aris, NP 05/09/20 1230

## 2020-05-30 ENCOUNTER — Emergency Department (HOSPITAL_COMMUNITY): Payer: Medicare Other

## 2020-05-30 ENCOUNTER — Emergency Department (HOSPITAL_COMMUNITY)
Admission: EM | Admit: 2020-05-30 | Discharge: 2020-05-30 | Disposition: A | Discharge status: Home / Self Care | Payer: Medicare Other | Attending: Emergency Medicine | Admitting: Emergency Medicine

## 2020-05-30 ENCOUNTER — Encounter (HOSPITAL_COMMUNITY): Payer: Self-pay | Admitting: Emergency Medicine

## 2020-05-30 ENCOUNTER — Ambulatory Visit (INDEPENDENT_AMBULATORY_CARE_PROVIDER_SITE_OTHER)
Admission: EM | Admit: 2020-05-30 | Discharge: 2020-05-30 | Disposition: A | Discharge status: Home / Self Care | Payer: Medicare Other | Source: Home / Self Care

## 2020-05-30 ENCOUNTER — Other Ambulatory Visit: Payer: Self-pay

## 2020-05-30 DIAGNOSIS — R0602 Shortness of breath: Secondary | ICD-10-CM | POA: Diagnosis not present

## 2020-05-30 DIAGNOSIS — J45901 Unspecified asthma with (acute) exacerbation: Secondary | ICD-10-CM | POA: Insufficient documentation

## 2020-05-30 DIAGNOSIS — A599 Trichomoniasis, unspecified: Secondary | ICD-10-CM | POA: Insufficient documentation

## 2020-05-30 DIAGNOSIS — Z7951 Long term (current) use of inhaled steroids: Secondary | ICD-10-CM | POA: Diagnosis not present

## 2020-05-30 DIAGNOSIS — Z711 Person with feared health complaint in whom no diagnosis is made: Secondary | ICD-10-CM | POA: Insufficient documentation

## 2020-05-30 DIAGNOSIS — J45909 Unspecified asthma, uncomplicated: Secondary | ICD-10-CM | POA: Diagnosis not present

## 2020-05-30 DIAGNOSIS — F17210 Nicotine dependence, cigarettes, uncomplicated: Secondary | ICD-10-CM | POA: Diagnosis not present

## 2020-05-30 LAB — BASIC METABOLIC PANEL
Anion gap: 11 (ref 5–15)
BUN: 13 mg/dL (ref 6–20)
CO2: 20 mmol/L — ABNORMAL LOW (ref 22–32)
Calcium: 9.6 mg/dL (ref 8.9–10.3)
Chloride: 104 mmol/L (ref 98–111)
Creatinine, Ser: 0.95 mg/dL (ref 0.61–1.24)
GFR, Estimated: >60 mL/min (ref >60)
Glucose, Bld: 95 mg/dL (ref 70–99)
Potassium: 3.8 mmol/L (ref 3.5–5.1)
Sodium: 135 mmol/L (ref 135–145)

## 2020-05-30 LAB — CBC WITH DIFFERENTIAL/PLATELET
Abs Immature Granulocytes: 0.01 10*3/uL (ref 0.00–0.07)
Basophils Absolute: 0.1 10*3/uL (ref 0.0–0.1)
Basophils Relative: 1 %
Eosinophils Absolute: 0.4 10*3/uL (ref 0.0–0.5)
Eosinophils Relative: 5 %
HCT: 44.4 % (ref 39.0–52.0)
Hemoglobin: 15.2 g/dL (ref 13.0–17.0)
Immature Granulocytes: 0 %
Lymphocytes Relative: 42 %
Lymphs Abs: 2.7 10*3/uL (ref 0.7–4.0)
MCH: 29.1 pg (ref 26.0–34.0)
MCHC: 34.2 g/dL (ref 30.0–36.0)
MCV: 84.9 fL (ref 80.0–100.0)
Monocytes Absolute: 0.5 10*3/uL (ref 0.1–1.0)
Monocytes Relative: 8 %
Neutro Abs: 2.8 10*3/uL (ref 1.7–7.7)
Neutrophils Relative %: 44 %
Platelets: 214 10*3/uL (ref 150–400)
RBC: 5.23 MIL/uL (ref 4.22–5.81)
RDW: 13.2 % (ref 11.5–15.5)
WBC: 6.5 10*3/uL (ref 4.0–10.5)
nRBC: 0 % (ref 0.0–0.2)

## 2020-05-30 MED ORDER — ALBUTEROL SULFATE HFA 108 (90 BASE) MCG/ACT IN AERS
2.0000 | INHALATION_SPRAY | Freq: Once | RESPIRATORY_TRACT | Status: AC
  Administered 2020-05-30: 2 via RESPIRATORY_TRACT
  Filled 2020-05-30: qty 6.7

## 2020-05-30 MED ORDER — METRONIDAZOLE 500 MG PO TABS: 2000.0000 mg | ORAL_TABLET | Freq: Once | ORAL | 0 refills | Status: AC

## 2020-05-30 NOTE — Discharge Instructions (Addendum)
You were seen in the emergency department today for evaluation of shortness of breath.  Your chest x-ray was normal.  Your labs did not show any significant abnormalities.  Your symptoms improved after use of an albuterol inhaler.  We are sending you home with this.  Please use 1 to 2 puffs every 4-6 hours as needed for wheezing or shortness of breath.  Should your symptoms not be alleviated with this or you are using your inhaler frequently return to the emergency department.  Additionally return for any new or worsening symptoms including but not limited to chest pain, passing out, swelling in your legs, coughing up blood, fever, persistent shortness of breath, symptoms not alleviated by an inhaler, or any other concerns.  Please follow-up with your primary care provider within 1 week for reevaluation.

## 2020-05-30 NOTE — ED Provider Notes (Signed)
[facility]    CSN: [id number] Arrival date & time: 05/30/20  4431      History   Chief Complaint Chief Complaint  Patient presents with  . Groin Swelling    HPI Kenneth Lane is a 34 y.o. male.   HPI  Patient who was seen earlier today in the ER presents with a concern for trichomonas.  He reports that his girlfriend told him that she tested positive for trichomonas.  He denies any penile discharge or painful urination.  However he does endorse some penile discomfort which is generalized.  He has not had any scrotal swelling, nausea or vomiting.  He is able to urinate without problems.  Past Medical History:  Diagnosis Date  . Anxiety   . Asthma   . Bradycardia   . Seasonal allergies     Patient Active Problem List   Diagnosis Date Noted  . Acute asthma exacerbation 07/23/2019  . Emesis 07/23/2019  . Asthma exacerbation 07/23/2019    Past Surgical History:  Procedure Laterality Date  . DENTAL SURGERY         Home Medications    Prior to Admission medications   Medication Sig Start Date End Date Taking? Authorizing Provider  albuterol (VENTOLIN HFA) 108 (90 Base) MCG/ACT inhaler Inhale 1-2 puffs into the lungs every 6 (six) hours as needed for wheezing or shortness of breath. 12/09/19   Wieters, Hallie C, PA-C  fluticasone (FLONASE) 50 MCG/ACT nasal spray Place 1-2 sprays into both nostrils daily for 7 days. 12/09/19 01/23/20  Wieters, Hallie C, PA-C  metroNIDAZOLE (FLAGYL) 500 MG tablet Take 4 tablets (2,000 mg total) by mouth once for 1 dose. 05/30/20 05/30/20  Bing Neighbors, FNP  budesonide (PULMICORT) 0.25 MG/2ML nebulizer solution Take 2 mLs (0.25 mg total) by nebulization 2 (two) times daily. 07/26/19 12/09/19  Joseph Art, DO  escitalopram (LEXAPRO) 10 MG tablet Take 1 tablet (10 mg total) by mouth daily. 02/27/19 07/18/19  Dahlia Byes A, NP  fluticasone (FLOVENT HFA) 110 MCG/ACT inhaler Take 2 puffs twice daily for 1-2 weeks when in your  yellow zone. 11/01/18 07/18/19  Sharlene Dory, DO  ipratropium-albuterol (DUONEB) 0.5-2.5 (3) MG/3ML SOLN Take 3 mLs by nebulization 4 (four) times daily for 7 days. Patient not taking: Reported on 11/08/2019 07/26/19 12/09/19  Joseph Art, DO  levocetirizine (XYZAL) 5 MG tablet Take 1 tablet (5 mg total) by mouth every evening. 11/01/18 02/27/19  Sharlene Dory, DO  pantoprazole (PROTONIX) 40 MG tablet Take 1 tablet (40 mg total) by mouth 2 (two) times daily for 7 days. Patient not taking: Reported on 11/08/2019 07/26/19 12/09/19  Joseph Art, DO  sucralfate (CARAFATE) 1 G tablet Take 1 tablet (1 g total) by mouth 4 (four) times daily. 06/08/11 10/20/11  Eber Hong, MD    Family History Family History  Problem Relation Age of Onset  . Cancer Other   . Hypertension Other   . Clotting disorder Mother   . Congestive Heart Failure Father   . Kidney disease Father   . Cancer Father     Social History Social History   Tobacco Use  . Smoking status: Current Every Day Smoker    Packs/day: 1.00    Types: Cigarettes  . Smokeless tobacco: Never Used  Vaping Use  . Vaping Use: Never used  Substance Use Topics  . Alcohol use: Yes    Alcohol/week: 4.0 standard drinks    Types: 4 Shots of liquor per  week  . Drug use: Never     Allergies   Shellfish-derived products and Orange concentrate [flavoring agent]   Review of Systems Review of Systems Pertinent negatives listed in HPI  Physical Exam Triage Vital Signs ED Triage Vitals [05/30/20 0858]  Enc Vitals Group     BP 117/74     Pulse Rate 70     Resp 17     Temp 98.1 F (36.7 C)     Temp Source Oral     SpO2 100 %     Weight      Height      Head Circumference      Peak Flow      Pain Score 0     Pain Loc      Pain Edu?      Excl. in GC?    No data found.  Updated Vital Signs BP 117/74 (BP Location: Left Arm)   Pulse 70   Temp 98.1 F (36.7 C) (Oral)   Resp 17   SpO2 100%   Visual  Acuity Right Eye Distance:   Left Eye Distance:   Bilateral Distance:    Right Eye Near:   Left Eye Near:    Bilateral Near:     Physical Exam General appearance: alert, well developed, well nourished, cooperative and in no distress Head: Normocephalic, without obvious abnormality, atraumatic Respiratory: Respirations even and unlabored, normal respiratory rate Heart: rate and rhythm normal. No gallop or murmurs noted on exam  Abdomen: BS +, no distention, no rebound tenderness, or no mass Extremities: No gross deformities Skin: Skin color, texture, turgor normal. No rashes seen  Psych: Appropriate mood and affect. Genitourinary: No penile discharge noted, no penile swelling or penile skin discoloration present.  No scrotal edema. Chaperone present for cytology collecting.  UC Treatments / Results  Labs (all labs ordered are listed, but only abnormal results are displayed) Labs Reviewed  CYTOLOGY, (ORAL, ANAL, URETHRAL) ANCILLARY ONLY    EKG   Radiology DG Chest 2 View  Result Date: 05/30/2020 CLINICAL DATA:  Shortness of breath.  Asthma attack EXAM: CHEST - 2 VIEW COMPARISON:  03/07/2020 FINDINGS: Normal heart size and mediastinal contours. Prominent markings with similar to priors. No acute infiltrate or edema. No effusion or pneumothorax. No acute osseous findings. IMPRESSION: Stable exam.  No acute finding. Electronically Signed   By: Marnee Spring M.D.   On: 05/30/2020 04:12    Procedures Procedures (including critical care time)  Medications Ordered in UC Medications - No data to display  Initial Impression / Assessment and Plan / UC Course  I have reviewed the triage vital signs and the nursing notes.  Pertinent labs & imaging results that were available during my care of the patient were reviewed by me and considered in my medical decision making (see chart for details).    Covering for trichomonas with metronidazole 2 g 1 times dose.  Urethral cytology  pending. Visualized penis and scrotum there is no visible swelling or abnormality seen on exam, therefore no ultrasound is indicated. Strict return precautions given.  Advised any abnormal results will be uploaded to MyChart and any treatment needed will be prescribed or patient may require to return if indicated.  Patient verbalized understanding of plan. Final Clinical Impressions(s) / UC Diagnoses   Final diagnoses:  Trichomoniasis, exposure  Concern about STD in male without diagnosis     Discharge Instructions     Take all 4 metronidazole tablets at one time.  Take with food to avoid abdominal upset.  Avoid any alcohol while taking medication.  Avoid sexual contact for the next 7 days to allow treatment and resolution of infection to occur and also to allow time for other STD testing results to be none available.  Your STD testing results will be made available via MyChart.   ED Prescriptions    Medication Sig Dispense Auth. Provider   metroNIDAZOLE (FLAGYL) 500 MG tablet Take 4 tablets (2,000 mg total) by mouth once for 1 dose. 4 tablet Bing Neighbors, FNP     PDMP not reviewed this encounter.   Bing Neighbors, FNP 05/30/20 1003

## 2020-05-30 NOTE — ED Triage Notes (Signed)
Pt presents with penile discomfort. Denies discharge or painful urination. States girlfriend currently has trich.

## 2020-05-30 NOTE — ED Triage Notes (Signed)
Patient reports asthma attack this morning with SOB and dry cough .

## 2020-05-30 NOTE — ED Provider Notes (Signed)
[name] EMERGENCY DEPARTMENT Provider Note   CSN: [id number] Arrival date & time: 05/30/20  8416     History Chief Complaint  Patient presents with  . Shortness of Breath    Kenneth Lane is a 34 y.o. male with a history of tobacco abuse, asthma, & anxiety who presents to the ED with complaints of dyspnea that occurred shortly PTA that is resolved @ present. Patient went to sleep feeling normal, woke up in the middle of the night short of breath with wheezing, and could not find his inhaler. He began to have a panic attack and came to the ED. He was given 2 puffs of albuterol in triage with resolution of sxs. Other than inhaler no alleviating/aggravating factors. Denies recent fever, chills, chest pain, cough, leg pain/swelling, or syncope.   HPI     Past Medical History:  Diagnosis Date  . Anxiety   . Asthma   . Bradycardia   . Seasonal allergies     Patient Active Problem List   Diagnosis Date Noted  . Acute asthma exacerbation 07/23/2019  . Emesis 07/23/2019  . Asthma exacerbation 07/23/2019    Past Surgical History:  Procedure Laterality Date  . DENTAL SURGERY         Family History  Problem Relation Age of Onset  . Cancer Other   . Hypertension Other   . Clotting disorder Mother   . Congestive Heart Failure Father   . Kidney disease Father   . Cancer Father     Social History   Tobacco Use  . Smoking status: Current Every Day Smoker    Packs/day: 1.00    Types: Cigarettes  . Smokeless tobacco: Never Used  Vaping Use  . Vaping Use: Never used  Substance Use Topics  . Alcohol use: Yes    Alcohol/week: 4.0 standard drinks    Types: 4 Shots of liquor per week  . Drug use: Never    Home Medications Prior to Admission medications   Medication Sig Start Date End Date Taking? Authorizing Provider  albuterol (VENTOLIN HFA) 108 (90 Base) MCG/ACT inhaler Inhale 1-2 puffs into the lungs every 6 (six) hours as needed for wheezing  or shortness of breath. 12/09/19   Wieters, Hallie C, PA-C  fluticasone (FLONASE) 50 MCG/ACT nasal spray Place 1-2 sprays into both nostrils daily for 7 days. 12/09/19 01/23/20  Wieters, Hallie C, PA-C  budesonide (PULMICORT) 0.25 MG/2ML nebulizer solution Take 2 mLs (0.25 mg total) by nebulization 2 (two) times daily. 07/26/19 12/09/19  Joseph Art, DO  escitalopram (LEXAPRO) 10 MG tablet Take 1 tablet (10 mg total) by mouth daily. 02/27/19 07/18/19  Dahlia Byes A, NP  fluticasone (FLOVENT HFA) 110 MCG/ACT inhaler Take 2 puffs twice daily for 1-2 weeks when in your yellow zone. 11/01/18 07/18/19  Sharlene Dory, DO  ipratropium-albuterol (DUONEB) 0.5-2.5 (3) MG/3ML SOLN Take 3 mLs by nebulization 4 (four) times daily for 7 days. Patient not taking: Reported on 11/08/2019 07/26/19 12/09/19  Joseph Art, DO  levocetirizine (XYZAL) 5 MG tablet Take 1 tablet (5 mg total) by mouth every evening. 11/01/18 02/27/19  Sharlene Dory, DO  pantoprazole (PROTONIX) 40 MG tablet Take 1 tablet (40 mg total) by mouth 2 (two) times daily for 7 days. Patient not taking: Reported on 11/08/2019 07/26/19 12/09/19  Joseph Art, DO  sucralfate (CARAFATE) 1 G tablet Take 1 tablet (1 g total) by mouth 4 (four) times daily. 06/08/11 10/20/11  Eber Hong,  MD    Allergies    Shellfish-derived products and Orange concentrate [flavoring agent]  Review of Systems   Review of Systems  Constitutional: Negative for chills and fever.  HENT: Negative for congestion, ear pain, sore throat and voice change.   Respiratory: Positive for shortness of breath (resolved @ present) and wheezing (resolved @ present). Negative for cough.   Cardiovascular: Negative for chest pain and leg swelling.  Gastrointestinal: Negative for abdominal pain and vomiting.  Neurological: Negative for syncope.  All other systems reviewed and are negative.   Physical Exam Updated Vital Signs BP 115/84   Pulse 78   Temp (!) 97.3 F  (36.3 C) (Oral)   Resp 18   Ht 5\' 5"  (1.651 m)   Wt 65 kg   SpO2 100%   BMI 23.85 kg/m   Physical Exam Vitals and nursing note reviewed.  Constitutional:      General: He is not in acute distress.    Appearance: He is well-developed. He is not toxic-appearing.  HENT:     Head: Normocephalic and atraumatic.  Eyes:     General:        Right eye: No discharge.        Left eye: No discharge.     Conjunctiva/sclera: Conjunctivae normal.  Cardiovascular:     Rate and Rhythm: Normal rate and regular rhythm.  Pulmonary:     Effort: Pulmonary effort is normal. No respiratory distress.     Breath sounds: Normal breath sounds. No wheezing, rhonchi or rales.  Abdominal:     General: There is no distension.     Palpations: Abdomen is soft.     Tenderness: There is no abdominal tenderness.  Musculoskeletal:     Cervical back: Neck supple.     Right lower leg: No tenderness. No edema.     Left lower leg: No tenderness. No edema.  Skin:    General: Skin is warm and dry.     Findings: No rash.  Neurological:     Mental Status: He is alert.     Comments: Clear speech.   Psychiatric:        Behavior: Behavior normal.    ED Results / Procedures / Treatments   Labs (all labs ordered are listed, but only abnormal results are displayed) Labs Reviewed  BASIC METABOLIC PANEL - Abnormal; Notable for the following components:      Result Value   CO2 20 (*)    All other components within normal limits  CBC WITH DIFFERENTIAL/PLATELET    EKG None  Radiology No results found.  Procedures Procedures (including critical care time)  Medications Ordered in ED Medications  albuterol (VENTOLIN HFA) 108 (90 Base) MCG/ACT inhaler 2 puff (2 puffs Inhalation Given 05/30/20 0328)    ED Course  I have reviewed the triage vital signs and the nursing notes.  Pertinent labs & imaging results that were available during my care of the patient were reviewed by me and considered in my medical  decision making (see chart for details).    MDM Rules/Calculators/A&P                         Patient presents to the ED with complaints of dyspnea.  Patient is nontoxic, resting comfortably, vitals without significant abnormality.   Additional history obtained:  Additional history obtained from chart review & nursing note review. . Lab Tests:  I reviewed and interpreted labs, which included:  CBC &  BMP: Unremarkable.   Imaging Studies ordered:  CXR ordered per triage, I independently visualized and interpreted imaging which showed no acute process.   Patient received albuterol in the ED prior to my assessment w/ resolution of sxs, currently asymptomatic, lungs are CTA. Given sxs resolved w/ clear lungs S/p 2 puffs of albuterol does not seem like asthma exacerbation requiring steroids, likely just needed rescue inhaler. No focal adventitious sounds or infiltrate on CXR to suggest pneumonia. Low risk wells- doubt PE. Labs reassuring. Appears appropriate for discharge. Will send home with inhaler provided in the ED. I discussed results, treatment plan, need for follow-up, and return precautions with the patient. Provided opportunity for questions, patient confirmed understanding and is in agreement with plan.    Portions of this note were generated with Scientist, clinical (histocompatibility and immunogenetics). Dictation errors may occur despite best attempts at proofreading.  Final Clinical Impression(s) / ED Diagnoses Final diagnoses:  Shortness of breath    Rx / DC Orders ED Discharge Orders    None       Cherly Anderson, PA-C 05/30/20 0438    Palumbo, April, MD 05/30/20 [phone]

## 2020-05-30 NOTE — Discharge Instructions (Signed)
Take all 4 metronidazole tablets at one time.  Take with food to avoid abdominal upset.  Avoid any alcohol while taking medication.  Avoid sexual contact for the next 7 days to allow treatment and resolution of infection to occur and also to allow time for other STD testing results to be none available.  Your STD testing results will be made available via MyChart.

## 2020-05-31 LAB — CYTOLOGY, (ORAL, ANAL, URETHRAL) ANCILLARY ONLY
Chlamydia: NEGATIVE
Comment: NEGATIVE
Comment: NEGATIVE
Comment: NORMAL
Neisseria Gonorrhea: NEGATIVE
Trichomonas: NEGATIVE

## 2020-06-29 ENCOUNTER — Emergency Department (HOSPITAL_COMMUNITY)
Admission: EM | Admit: 2020-06-29 | Discharge: 2020-06-29 | Disposition: A | Discharge status: Home / Self Care | Payer: Medicare Other | Attending: Emergency Medicine | Admitting: Emergency Medicine

## 2020-06-29 ENCOUNTER — Encounter (HOSPITAL_COMMUNITY): Payer: Self-pay | Admitting: *Deleted

## 2020-06-29 ENCOUNTER — Other Ambulatory Visit: Payer: Self-pay

## 2020-06-29 ENCOUNTER — Emergency Department (HOSPITAL_COMMUNITY): Payer: Medicare Other

## 2020-06-29 DIAGNOSIS — W1789XA Other fall from one level to another, initial encounter: Secondary | ICD-10-CM | POA: Diagnosis not present

## 2020-06-29 DIAGNOSIS — S93432A Sprain of tibiofibular ligament of left ankle, initial encounter: Secondary | ICD-10-CM | POA: Insufficient documentation

## 2020-06-29 DIAGNOSIS — S93492A Sprain of other ligament of left ankle, initial encounter: Secondary | ICD-10-CM | POA: Diagnosis not present

## 2020-06-29 DIAGNOSIS — J45909 Unspecified asthma, uncomplicated: Secondary | ICD-10-CM | POA: Diagnosis not present

## 2020-06-29 DIAGNOSIS — T1490XA Injury, unspecified, initial encounter: Secondary | ICD-10-CM

## 2020-06-29 DIAGNOSIS — M25562 Pain in left knee: Secondary | ICD-10-CM | POA: Diagnosis not present

## 2020-06-29 DIAGNOSIS — F419 Anxiety disorder, unspecified: Secondary | ICD-10-CM | POA: Insufficient documentation

## 2020-06-29 DIAGNOSIS — M25572 Pain in left ankle and joints of left foot: Secondary | ICD-10-CM | POA: Diagnosis not present

## 2020-06-29 DIAGNOSIS — Z79899 Other long term (current) drug therapy: Secondary | ICD-10-CM | POA: Diagnosis not present

## 2020-06-29 DIAGNOSIS — F17210 Nicotine dependence, cigarettes, uncomplicated: Secondary | ICD-10-CM | POA: Diagnosis not present

## 2020-06-29 DIAGNOSIS — Z23 Encounter for immunization: Secondary | ICD-10-CM | POA: Diagnosis not present

## 2020-06-29 MED ORDER — IBUPROFEN 800 MG PO TABS
800.0000 mg | ORAL_TABLET | Freq: Once | ORAL | Status: AC
  Administered 2020-06-29: 800 mg via ORAL
  Filled 2020-06-29: qty 1

## 2020-06-29 MED ORDER — TETANUS-DIPHTH-ACELL PERTUSSIS 5-2.5-18.5 LF-MCG/0.5 IM SUSY
0.5000 mL | PREFILLED_SYRINGE | Freq: Once | INTRAMUSCULAR | Status: AC
  Administered 2020-06-29: 0.5 mL via INTRAMUSCULAR
  Filled 2020-06-29: qty 0.5
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
## 2020-06-29 NOTE — Discharge Instructions (Addendum)
Please follow-up with your primary care provider regarding today's encounter and for ongoing evaluation and management.    I encourage RICE therapy, as we discussed.  Rest, ice, compression, and elevation.  Weightbearing as tolerated.  I prescribed you ibuprofen 600 mg every 6 hours as needed for pain symptoms.  You may supplement with Tylenol as needed.  If your symptoms fail to improve with conservative therapy, you may follow-up with orthopedics for ongoing evaluation and management.  While your x-rays were without any bony abnormalities, cannot exclude ligamentous or tendinous injury.  Return to the ED or seek immediate medical attention should you experience any new or worsening symptoms.
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
## 2020-08-06 ENCOUNTER — Other Ambulatory Visit (INDEPENDENT_AMBULATORY_CARE_PROVIDER_SITE_OTHER): Payer: Medicare Other

## 2020-08-06 ENCOUNTER — Other Ambulatory Visit: Payer: Self-pay

## 2020-08-06 DIAGNOSIS — Z832 Family history of diseases of the blood and blood-forming organs and certain disorders involving the immune mechanism: Secondary | ICD-10-CM

## 2020-08-06 NOTE — Progress Notes (Signed)
    Patient is the FOB to our patient Kenneth Lane) who is a silent carrier for alpha thalassemia.  FOB agreed to hemoglobinopathy testing. Order placed, will follow up results and manage accordingly.   Jaynie Collins, MD, FACOG Obstetrician & Gynecologist, [facility] [employer], [facility]

## 2020-08-08 ENCOUNTER — Telehealth: Payer: Self-pay | Admitting: *Deleted

## 2020-08-08 LAB — HGB FRACTIONATION CASCADE
Hgb A2: 2.9 % (ref 1.8–3.2)
Hgb A: 97.1 % (ref 96.4–98.8)
Hgb F: 0 % (ref 0.0–2.0)
Hgb S: 0 %

## 2020-08-08 NOTE — Telephone Encounter (Signed)
Informed of his blood results

## 2020-08-10 ENCOUNTER — Encounter (HOSPITAL_COMMUNITY): Payer: Self-pay | Admitting: Emergency Medicine

## 2020-08-10 ENCOUNTER — Emergency Department (HOSPITAL_COMMUNITY)
Admission: EM | Admit: 2020-08-10 | Discharge: 2020-08-10 | Disposition: A | Discharge status: Home / Self Care | Payer: Medicare Other | Attending: Emergency Medicine | Admitting: Emergency Medicine

## 2020-08-10 ENCOUNTER — Other Ambulatory Visit: Payer: Self-pay

## 2020-08-10 DIAGNOSIS — U071 COVID-19: Secondary | ICD-10-CM | POA: Diagnosis not present

## 2020-08-10 DIAGNOSIS — Z5321 Procedure and treatment not carried out due to patient leaving prior to being seen by health care provider: Secondary | ICD-10-CM | POA: Diagnosis not present

## 2020-08-10 DIAGNOSIS — R519 Headache, unspecified: Secondary | ICD-10-CM | POA: Diagnosis present

## 2020-08-10 LAB — POC SARS CORONAVIRUS 2 AG -  ED: SARS Coronavirus 2 Ag: POSITIVE — AB

## 2020-08-10 NOTE — ED Triage Notes (Signed)
Pt reports body aches and headache x 2 days. Requesting COVID testing.

## 2020-08-10 NOTE — ED Notes (Signed)
Pt up to desk stating that he is going to sleep in his car. Pt informed that if he is not in the lobby when they call him for a room he will be moved out of the system. Pt went to car anyway.

## 2020-08-10 NOTE — ED Notes (Signed)
Called pt's cell phone to tell him room is available. No response.`

## 2020-11-11 ENCOUNTER — Encounter (HOSPITAL_COMMUNITY): Payer: Self-pay | Admitting: Emergency Medicine

## 2020-11-11 ENCOUNTER — Emergency Department (HOSPITAL_COMMUNITY): Payer: Medicare Other

## 2020-11-11 ENCOUNTER — Emergency Department (HOSPITAL_COMMUNITY)
Admission: EM | Admit: 2020-11-11 | Discharge: 2020-11-11 | Disposition: A | Discharge status: Home / Self Care | Payer: Medicare Other | Attending: Emergency Medicine | Admitting: Emergency Medicine

## 2020-11-11 ENCOUNTER — Other Ambulatory Visit: Payer: Self-pay

## 2020-11-11 DIAGNOSIS — M25512 Pain in left shoulder: Secondary | ICD-10-CM | POA: Diagnosis not present

## 2020-11-11 DIAGNOSIS — F17210 Nicotine dependence, cigarettes, uncomplicated: Secondary | ICD-10-CM | POA: Diagnosis not present

## 2020-11-11 DIAGNOSIS — J45909 Unspecified asthma, uncomplicated: Secondary | ICD-10-CM | POA: Diagnosis not present

## 2020-11-11 DIAGNOSIS — R0789 Other chest pain: Secondary | ICD-10-CM | POA: Insufficient documentation

## 2020-11-11 DIAGNOSIS — R112 Nausea with vomiting, unspecified: Secondary | ICD-10-CM | POA: Insufficient documentation

## 2020-11-11 DIAGNOSIS — R079 Chest pain, unspecified: Secondary | ICD-10-CM

## 2020-11-11 DIAGNOSIS — R059 Cough, unspecified: Secondary | ICD-10-CM | POA: Diagnosis not present

## 2020-11-11 LAB — CBC
HCT: 43.9 % (ref 39.0–52.0)
Hemoglobin: 15 g/dL (ref 13.0–17.0)
MCH: 29.2 pg (ref 26.0–34.0)
MCHC: 34.2 g/dL (ref 30.0–36.0)
MCV: 85.4 fL (ref 80.0–100.0)
Platelets: 237 10*3/uL (ref 150–400)
RBC: 5.14 MIL/uL (ref 4.22–5.81)
RDW: 14 % (ref 11.5–15.5)
WBC: 7.6 10*3/uL (ref 4.0–10.5)
nRBC: 0 % (ref 0.0–0.2)

## 2020-11-11 LAB — BASIC METABOLIC PANEL
Anion gap: 8 (ref 5–15)
BUN: 13 mg/dL (ref 6–20)
CO2: 23 mmol/L (ref 22–32)
Calcium: 9.4 mg/dL (ref 8.9–10.3)
Chloride: 107 mmol/L (ref 98–111)
Creatinine, Ser: 0.84 mg/dL (ref 0.61–1.24)
GFR, Estimated: >60 mL/min (ref >60)
Glucose, Bld: 101 mg/dL — ABNORMAL HIGH (ref 70–99)
Potassium: 3.7 mmol/L (ref 3.5–5.1)
Sodium: 138 mmol/L (ref 135–145)

## 2020-11-11 LAB — HEPATIC FUNCTION PANEL
ALT: 21 U/L (ref 0–44)
AST: 20 U/L (ref 15–41)
Albumin: 4.1 g/dL (ref 3.5–5.0)
Alkaline Phosphatase: 71 U/L (ref 38–126)
Bilirubin, Direct: <0.1 mg/dL (ref 0.0–0.2)
Total Bilirubin: 0.5 mg/dL (ref 0.3–1.2)
Total Protein: 6.8 g/dL (ref 6.5–8.1)

## 2020-11-11 LAB — TROPONIN I (HIGH SENSITIVITY): Troponin I (High Sensitivity): 5 ng/L (ref <18)

## 2020-11-11 LAB — LIPASE, BLOOD: Lipase: 28 U/L (ref 11–51)

## 2020-11-11 MED ORDER — ALUM & MAG HYDROXIDE-SIMETH 200-200-20 MG/5ML PO SUSP
30.0000 mL | Freq: Once | ORAL | Status: AC
  Administered 2020-11-11: 30 mL via ORAL
  Filled 2020-11-11: qty 30

## 2020-11-11 MED ORDER — ALBUTEROL SULFATE HFA 108 (90 BASE) MCG/ACT IN AERS
2.0000 | INHALATION_SPRAY | Freq: Once | RESPIRATORY_TRACT | Status: AC
  Administered 2020-11-11: 2 via RESPIRATORY_TRACT
  Filled 2020-11-11: qty 6.7

## 2020-11-11 MED ORDER — OMEPRAZOLE 20 MG PO CPDR: 20.0000 mg | DELAYED_RELEASE_CAPSULE | Freq: Every day | ORAL | 1 refills | Status: AC

## 2020-11-11 NOTE — ED Provider Notes (Signed)
[facility] Emergency Department Provider Note MRN:  [id number]  Arrival date & time: 11/11/20     Chief Complaint   Chest Pain   History of Present Illness   Kenneth Lane is a 35 y.o. year-old male with a history of asthma presenting to the ED with chief complaint of chest pain.  Chest pain and left shoulder pain with nausea and vomiting that started at 7 PM last night, shortly after eating a meal.  Described as a pressure.  Also recent cough.  Denies fever, no abdominal pain, no diarrhea, no numbness or weakness to the arms or legs.  No history of blood clots.  No trauma.  Symptoms constant, mild to moderate in severity.  No other exacerbating or alleviating factors.  Review of Systems  A complete 10 system review of systems was obtained and all systems are negative except as noted in the HPI and PMH.   Patient's Health History    Past Medical History:  Diagnosis Date  . Anxiety   . Asthma   . Bradycardia   . Seasonal allergies     Past Surgical History:  Procedure Laterality Date  . DENTAL SURGERY      Family History  Problem Relation Age of Onset  . Cancer Other   . Hypertension Other   . Clotting disorder Mother   . Congestive Heart Failure Father   . Kidney disease Father   . Cancer Father     Social History   Socioeconomic History  . Marital status: Single    Spouse name: Not on file  . Number of children: Not on file  . Years of education: Not on file  . Highest education level: Not on file  Occupational History  . Not on file  Tobacco Use  . Smoking status: Current Every Day Smoker    Packs/day: 1.00    Types: Cigarettes  . Smokeless tobacco: Never Used  Vaping Use  . Vaping Use: Never used  Substance and Sexual Activity  . Alcohol use: Yes    Alcohol/week: 4.0 standard drinks    Types: 4 Shots of liquor per week  . Drug use: Never  . Sexual activity: Not on file  Other Topics Concern  . Not on file  Social History  Narrative  . Not on file   Social Determinants of Health   Financial Resource Strain: Not on file  Food Insecurity: Not on file  Transportation Needs: Not on file  Physical Activity: Not on file  Stress: Not on file  Social Connections: Not on file  Intimate Partner Violence: Not on file     Physical Exam   Vitals:   11/11/20 0049 11/11/20 0056  BP:  120/84  Pulse:  (!) 59  Resp: 18 18  Temp:  97.9 F (36.6 C)  SpO2:  94%    CONSTITUTIONAL: Well-appearing, NAD NEURO:  Alert and oriented x 3, no focal deficits EYES:  eyes equal and reactive ENT/NECK:  no LAD, no JVD CARDIO: Regular rate, well-perfused, normal S1 and S2 PULM: Faint scattered wheezes GI/GU:  normal bowel sounds, non-distended, non-tender MSK/SPINE:  No gross deformities, no edema SKIN:  no rash, atraumatic PSYCH:  Appropriate speech and behavior  *Additional and/or pertinent findings included in MDM below  Diagnostic and Interventional Summary    EKG Interpretation  Date/Time:  Monday November 11 2020 00:51:33 EDT Ventricular Rate:  61 PR Interval:  160 QRS Duration: 96 QT Interval:  408 QTC Calculation: 410 R  Axis:   52 Text Interpretation: Normal sinus rhythm with sinus arrhythmia Normal ECG Confirmed by Kennis Carina [phone]) on 11/11/2020 1:19:59 AM      Labs Reviewed  BASIC METABOLIC PANEL - Abnormal; Notable for the following components:      Result Value   Glucose, Bld 101 (*)    All other components within normal limits  CBC  LIPASE, BLOOD  HEPATIC FUNCTION PANEL  TROPONIN I (HIGH SENSITIVITY)  TROPONIN I (HIGH SENSITIVITY)    DG Chest 2 View  Final Result      Medications  albuterol (VENTOLIN HFA) 108 (90 Base) MCG/ACT inhaler 2 puff (2 puffs Inhalation Given 11/11/20 0236)  alum & mag hydroxide-simeth (MAALOX/MYLANTA) 200-200-20 MG/5ML suspension 30 mL (30 mLs Oral Given 11/11/20 0237)     Procedures  /  Critical Care Procedures  ED Course and Medical Decision Making  I  have reviewed the triage vital signs, the nursing notes, and pertinent available records from the EMR.  Listed above are laboratory and imaging tests that I personally ordered, reviewed, and interpreted and then considered in my medical decision making (see below for details).  Some mild wheezing on exam, recent cough, and so suspect a component of asthma exacerbation.  Given the onset of symptoms after eating, also considering GERD.  Providing albuterol, GI cocktail, will reassess.  Anticipating discharge.  No evidence of DVT, no hypoxia, no tachycardia, highly doubt PE.     Feeling much better after GI cocktail, albuterol inhaler, labs reassuring, appropriate for discharge.  Elmer Sow. Pilar Plate, MD [facility] Emergency Medicine [facility] [email or web address]  Final Clinical Impressions(s) / ED Diagnoses     ICD-10-CM   1. Chest pain, unspecified type  R07.9     ED Discharge Orders         Ordered    omeprazole (PRILOSEC) 20 MG capsule  Daily        11/11/20 0355           Discharge Instructions Discussed with and Provided to Patient:     Discharge Instructions     You were evaluated in the Emergency Department and after careful evaluation, we did not find any emergent condition requiring admission or further testing in the hospital.  Your exam/testing today was overall reassuring.  Symptoms may be related to asthma or acid reflux.  Please continue taking your inhalers at home as needed.  Recommend taking the omeprazole medication daily to prevent pain.  Please return to the Emergency Department if you experience any worsening of your condition.  Thank you for allowing [place] to be a part of your care.        Sabas Sous, MD 11/11/20 [phone]

## 2020-11-11 NOTE — ED Triage Notes (Signed)
Patient reports central  chest pain with SOB , productive cough/chest congestion and emesis yesterday.

## 2020-11-11 NOTE — Discharge Instructions (Addendum)
You were evaluated in the Emergency Department and after careful evaluation, we did not find any emergent condition requiring admission or further testing in the hospital.  Your exam/testing today was overall reassuring.  Symptoms may be related to asthma or acid reflux.  Please continue taking your inhalers at home as needed.  Recommend taking the omeprazole medication daily to prevent pain.  Please return to the Emergency Department if you experience any worsening of your condition.  Thank you for allowing [place] to be a part of your care.

## 2021-01-07 ENCOUNTER — Emergency Department (HOSPITAL_COMMUNITY): Payer: Medicare Other

## 2021-01-07 ENCOUNTER — Other Ambulatory Visit: Payer: Self-pay

## 2021-01-07 ENCOUNTER — Encounter (HOSPITAL_COMMUNITY): Payer: Self-pay

## 2021-01-07 ENCOUNTER — Encounter (HOSPITAL_COMMUNITY): Payer: Self-pay | Admitting: Emergency Medicine

## 2021-01-07 ENCOUNTER — Ambulatory Visit (HOSPITAL_COMMUNITY)
Admission: EM | Admit: 2021-01-07 | Discharge: 2021-01-07 | Disposition: A | Discharge status: Home / Self Care | Payer: Medicare Other | Attending: Physician Assistant | Admitting: Physician Assistant

## 2021-01-07 ENCOUNTER — Emergency Department (HOSPITAL_COMMUNITY)
Admission: EM | Admit: 2021-01-07 | Discharge: 2021-01-07 | Disposition: A | Discharge status: Home / Self Care | Payer: Medicare Other | Attending: Emergency Medicine | Admitting: Emergency Medicine

## 2021-01-07 DIAGNOSIS — Z5321 Procedure and treatment not carried out due to patient leaving prior to being seen by health care provider: Secondary | ICD-10-CM | POA: Diagnosis not present

## 2021-01-07 DIAGNOSIS — M25511 Pain in right shoulder: Secondary | ICD-10-CM | POA: Insufficient documentation

## 2021-01-07 DIAGNOSIS — J45909 Unspecified asthma, uncomplicated: Secondary | ICD-10-CM | POA: Diagnosis not present

## 2021-01-07 DIAGNOSIS — R0602 Shortness of breath: Secondary | ICD-10-CM | POA: Insufficient documentation

## 2021-01-07 DIAGNOSIS — R001 Bradycardia, unspecified: Secondary | ICD-10-CM | POA: Diagnosis not present

## 2021-01-07 DIAGNOSIS — J4520 Mild intermittent asthma, uncomplicated: Secondary | ICD-10-CM

## 2021-01-07 DIAGNOSIS — F419 Anxiety disorder, unspecified: Secondary | ICD-10-CM | POA: Diagnosis not present

## 2021-01-07 MED ORDER — ALBUTEROL SULFATE HFA 108 (90 BASE) MCG/ACT IN AERS
2.0000 | INHALATION_SPRAY | RESPIRATORY_TRACT | Status: DC | PRN
  Administered 2021-01-07: 2 via RESPIRATORY_TRACT
  Filled 2021-01-07: qty 6.7

## 2021-01-07 MED ORDER — PREDNISONE 10 MG (21) PO TBPK: ORAL_TABLET | ORAL | 0 refills | Status: DC

## 2021-01-07 NOTE — Discharge Instructions (Addendum)
Start prednisone to help with both your shoulder pain and asthma.  While you are taking this medication do not take NSAIDs (aspirin, ibuprofen/Advil, naproxen/Aleve).  You can use Tylenol for breakthrough pain.  Make sure to keep your albuterol inhaler with you in case you needed.  Follow-up with your primary care provider within 1 week to ensure symptom improvement.  If you develop any worsening symptoms including cough, shortness of breath, chest tightness you need to be reevaluated.  If your shoulder does not improve with this medication I recommend you follow-up with sports medicine as we discussed.

## 2021-01-07 NOTE — ED Provider Notes (Signed)
[facility]    CSN: [id number] Arrival date & time: 01/07/21  1006      History   Chief Complaint Chief Complaint  Patient presents with   Shoulder Pain    Right    HPI OIVA DIBARI is a 35 y.o. male.   Patient presents today with 3-day history of right shoulder pain.  He checked into the emergency room earlier and had x-ray that was normal did not wait to be seen by provider.  States that several days ago he felt his right shoulder dislocate and then popped back into place.  He has had persistent pain in the posterior aspect of his right shoulder since that time.  Pain is rated 7 on a 0-10 pain scale, localized to posterior right shoulder with radiation into upper right arm to elbow, described as aching worse with certain movements, no relieving factors identified.  Patient is left-handed.  Reports heavy lifting at his job but denies any specific injury.  He denies any weakness, numbness, paresthesias.  He has not tried any over-the-counter medication for symptom management.  In addition, patient has a history of asthma.  He reported increased wheezing when he presented to the emergency room earlier today and was given albuterol inhaler which she has used several times with improvement of symptoms.  He denies any current shortness of breath, chest tightness, cough.  Reports he is only ever used albuterol to manage his asthma symptoms.  He does report being hospitalized for asthma several years ago but denies previous intubation.  He does have a primary care provider who manages this condition and intends to follow-up with this provider in the near future.   Past Medical History:  Diagnosis Date   Anxiety    Asthma    Bradycardia    Seasonal allergies     Patient Active Problem List   Diagnosis Date Noted   Acute asthma exacerbation 07/23/2019   Emesis 07/23/2019   Asthma exacerbation 07/23/2019    Past Surgical History:  Procedure Laterality Date   DENTAL  SURGERY         Home Medications    Prior to Admission medications   Medication Sig Start Date End Date Taking? Authorizing Provider  predniSONE (STERAPRED UNI-PAK 21 TAB) 10 MG (21) TBPK tablet As directed 01/07/21  Yes Annabeth Tortora K, PA-C  albuterol (VENTOLIN HFA) 108 (90 Base) MCG/ACT inhaler Inhale 1-2 puffs into the lungs every 6 (six) hours as needed for wheezing or shortness of breath. 12/09/19   Wieters, Hallie C, PA-C  omeprazole (PRILOSEC) 20 MG capsule Take 1 capsule (20 mg total) by mouth daily. 11/11/20   Sabas Sous, MD  budesonide (PULMICORT) 0.25 MG/2ML nebulizer solution Take 2 mLs (0.25 mg total) by nebulization 2 (two) times daily. 07/26/19 12/09/19  Joseph Art, DO  escitalopram (LEXAPRO) 10 MG tablet Take 1 tablet (10 mg total) by mouth daily. 02/27/19 07/18/19  Dahlia Byes A, NP  fluticasone (FLOVENT HFA) 110 MCG/ACT inhaler Take 2 puffs twice daily for 1-2 weeks when in your yellow zone. 11/01/18 07/18/19  Sharlene Dory, DO  ipratropium-albuterol (DUONEB) 0.5-2.5 (3) MG/3ML SOLN Take 3 mLs by nebulization 4 (four) times daily for 7 days. Patient not taking: Reported on 11/08/2019 07/26/19 12/09/19  Joseph Art, DO  levocetirizine (XYZAL) 5 MG tablet Take 1 tablet (5 mg total) by mouth every evening. 11/01/18 02/27/19  Sharlene Dory, DO  pantoprazole (PROTONIX) 40 MG tablet Take 1 tablet (40 mg  total) by mouth 2 (two) times daily for 7 days. Patient not taking: Reported on 11/08/2019 07/26/19 12/09/19  Joseph ArtVann, Jessica U, DO  sucralfate (CARAFATE) 1 G tablet Take 1 tablet (1 g total) by mouth 4 (four) times daily. 06/08/11 10/20/11  Eber HongMiller, Brian, MD    Family History Family History  Problem Relation Age of Onset   Cancer Other    Hypertension Other    Clotting disorder Mother    Congestive Heart Failure Father    Kidney disease Father    Cancer Father     Social History Social History   Tobacco Use   Smoking status: Every Day    Packs/day:  1.00    Pack years: 0.00    Types: Cigarettes   Smokeless tobacco: Never  Vaping Use   Vaping Use: Never used  Substance Use Topics   Alcohol use: Yes    Alcohol/week: 4.0 standard drinks    Types: 4 Shots of liquor per week   Drug use: Never     Allergies   Shellfish-derived products and Orange concentrate [flavoring agent]   Review of Systems Review of Systems  Constitutional:  Positive for activity change. Negative for appetite change, fatigue and fever.  Respiratory:  Positive for cough (improving) and shortness of breath (improving).   Cardiovascular:  Negative for chest pain.  Gastrointestinal:  Negative for abdominal pain, diarrhea, nausea and vomiting.  Musculoskeletal:  Positive for arthralgias. Negative for myalgias.  Neurological:  Negative for dizziness, light-headedness and headaches.    Physical Exam Triage Vital Signs ED Triage Vitals  Enc Vitals Group     BP 01/07/21 1149 (!) 103/57     Pulse Rate 01/07/21 1149 67     Resp 01/07/21 1149 16     Temp 01/07/21 1149 98 F (36.7 C)     Temp Source 01/07/21 1149 Oral     SpO2 01/07/21 1149 96 %     Weight --      Height --      Head Circumference --      Peak Flow --      Pain Score 01/07/21 1147 7     Pain Loc --      Pain Edu? --      Excl. in GC? --    No data found.  Updated Vital Signs BP (!) 103/57 (BP Location: Left Arm)   Pulse 67   Temp 98 F (36.7 C) (Oral)   Resp 16   SpO2 96%   Visual Acuity Right Eye Distance:   Left Eye Distance:   Bilateral Distance:    Right Eye Near:   Left Eye Near:    Bilateral Near:     Physical Exam Vitals reviewed.  Constitutional:      General: He is awake.     Appearance: Normal appearance. He is normal weight. He is not ill-appearing.     Comments: Very pleasant male appears stated age in no acute distress  HENT:     Head: Normocephalic and atraumatic.     Mouth/Throat:     Pharynx: No oropharyngeal exudate or posterior oropharyngeal  erythema.  Cardiovascular:     Rate and Rhythm: Normal rate and regular rhythm.     Heart sounds: Normal heart sounds, S1 normal and S2 normal. No murmur heard. Pulmonary:     Effort: Pulmonary effort is normal.     Breath sounds: No stridor. Wheezing present. No rhonchi or rales.     Comments: Scattered wheezes throughout  lung fields Musculoskeletal:     Right shoulder: Tenderness present. No swelling, deformity or bony tenderness. Decreased range of motion. Normal strength.     Comments: Right shoulder: Decreased range of motion with overhead flexion.  Strength 5/5 and shoulder and elbow.  No deformity noted.  Tender to palpation over posterior right shoulder.  Negative empty can and drop arm.  Neurological:     Mental Status: He is alert.  Psychiatric:        Behavior: Behavior is cooperative.     UC Treatments / Results  Labs (all labs ordered are listed, but only abnormal results are displayed) Labs Reviewed - No data to display  EKG   Radiology DG Chest 2 View  Result Date: 01/07/2021 CLINICAL DATA:  Short of breath and asthma EXAM: CHEST - 2 VIEW COMPARISON:  11/11/2020 FINDINGS: The heart size and mediastinal contours are within normal limits. Both lungs are clear. The visualized skeletal structures are unremarkable. IMPRESSION: No active cardiopulmonary disease. Electronically Signed   By: Marlan Palau M.D.   On: 01/07/2021 09:03   DG Shoulder Right  Result Date: 01/07/2021 CLINICAL DATA:  Right shoulder pain for 3 days EXAM: RIGHT SHOULDER - 2+ VIEW COMPARISON:  None. FINDINGS: There is no evidence of fracture or dislocation. There is no evidence of arthropathy or other focal bone abnormality. Soft tissues are unremarkable. IMPRESSION: Negative. Electronically Signed   By: Duanne Guess D.O.   On: 01/07/2021 09:31    Procedures Procedures (including critical care time)  Medications Ordered in UC Medications - No data to display  Initial Impression / Assessment  and Plan / UC Course  I have reviewed the triage vital signs and the nursing notes.  Pertinent labs & imaging results that were available during my care of the patient were reviewed by me and considered in my medical decision making (see chart for details).      Physical exam and vital signs reassuring today.  X-ray obtained in ER showed no acute abnormality.  We will start patient on prednisone taper and he was instructed to continue using albuterol as prescribed.  He was instructed not to take NSAIDs with prednisone due to risk of GI bleeding.  He is to follow-up with PCP within 1 week.  Discussed alarm symptoms that warrant emergent evaluation.  Strict return precautions given to which patient expressed understanding.  X-ray obtained in the emergency room showed no acute osseous abnormality of right shoulder.  Patient was given prednisone to manage asthma discussed this would also help with inflammation and pain related to his right shoulder.  He was instructed not to take additional NSAIDs with prednisone due to risk of GI bleeding but was encouraged to use Tylenol for breakthrough pain.  Discouraged strenuous activity and recommended he rest.  Discussed that if symptoms persist he should follow-up with sports medicine and was given contact information for local provider.  Strict return precautions given to which patient expressed understanding.  Final Clinical Impressions(s) / UC Diagnoses   Final diagnoses:  Acute pain of right shoulder  Mild intermittent asthma without complication     Discharge Instructions      Start prednisone to help with both your shoulder pain and asthma.  While you are taking this medication do not take NSAIDs (aspirin, ibuprofen/Advil, naproxen/Aleve).  You can use Tylenol for breakthrough pain.  Make sure to keep your albuterol inhaler with you in case you needed.  Follow-up with your primary care provider within 1 week to  ensure symptom improvement.  If you  develop any worsening symptoms including cough, shortness of breath, chest tightness you need to be reevaluated.  If your shoulder does not improve with this medication I recommend you follow-up with sports medicine as we discussed.     ED Prescriptions     Medication Sig Dispense Auth. Provider   predniSONE (STERAPRED UNI-PAK 21 TAB) 10 MG (21) TBPK tablet As directed 21 tablet Ariann Khaimov K, PA-C      PDMP not reviewed this encounter.   Jeani Hawking, PA-C 01/07/21 1229

## 2021-01-07 NOTE — ED Triage Notes (Signed)
Pt presents with right shoulder pain xs 3 days. States was checked into ED for asthma this am and given an inhaler but left due to wait time.

## 2021-01-07 NOTE — ED Provider Notes (Signed)
Emergency Medicine Provider Triage Evaluation Note  Kenneth Lane , a 35 y.o. male  was evaluated in triage.  Pt complains of sob and R shoulder pain. SOB began today, c/w with previous asthma flares. No albuterol at home. No fever, cp. No covid vaccines. No sick contacts.  Additionally having 3 shoulder pain for 3 days. Dislocated it a few days ago and ppl at work "put it back in." Has been hurting since.   Review of Systems  Positive: R shoulder pain, sob Negative: fever  Physical Exam  BP 110/73 (BP Location: Left Arm)   Pulse 82   Temp 98.1 F (36.7 C) (Oral)   Resp 18   SpO2 95%  Gen:   Awake, no distress   Resp:  Normal effort. Clear lung sounds MSK:   Moves extremities without difficulty. Diffuse ttp of the R shoulder. No obvious deformity   Medical Decision Making  Medically screening exam initiated at 9:05 AM.  Appropriate orders placed.  Kenneth Lane was informed that the remainder of the evaluation will be completed by another provider, this initial triage assessment does not replace that evaluation, and the importance of remaining in the ED until their evaluation is complete.  R shoulder xray. Cxr already ordered by RN. Albuterol given.    Alveria Apley, PA-C 01/07/21 1610    Pricilla Loveless, MD 01/07/21 [phone]

## 2021-01-07 NOTE — ED Triage Notes (Signed)
Pt presents with SOB x1 day, pt reports hx of asthma, dors not have an inhaler at home. Pt also c/o Right arm/shoulder from a recent dislocated shoulder

## 2021-01-28 ENCOUNTER — Emergency Department (HOSPITAL_COMMUNITY)
Admission: EM | Admit: 2021-01-28 | Discharge: 2021-01-28 | Disposition: A | Discharge status: Home / Self Care | Payer: Medicare Other | Attending: Emergency Medicine | Admitting: Emergency Medicine

## 2021-01-28 ENCOUNTER — Other Ambulatory Visit: Payer: Self-pay

## 2021-01-28 ENCOUNTER — Encounter (HOSPITAL_COMMUNITY): Payer: Self-pay | Admitting: Emergency Medicine

## 2021-01-28 DIAGNOSIS — F410 Panic disorder [episodic paroxysmal anxiety] without agoraphobia: Secondary | ICD-10-CM | POA: Diagnosis not present

## 2021-01-28 DIAGNOSIS — R0602 Shortness of breath: Secondary | ICD-10-CM | POA: Diagnosis not present

## 2021-01-28 DIAGNOSIS — J45901 Unspecified asthma with (acute) exacerbation: Secondary | ICD-10-CM | POA: Insufficient documentation

## 2021-01-28 DIAGNOSIS — Z7951 Long term (current) use of inhaled steroids: Secondary | ICD-10-CM | POA: Insufficient documentation

## 2021-01-28 DIAGNOSIS — F17210 Nicotine dependence, cigarettes, uncomplicated: Secondary | ICD-10-CM | POA: Insufficient documentation

## 2021-01-28 MED ORDER — ALBUTEROL SULFATE HFA 108 (90 BASE) MCG/ACT IN AERS: 2.0000 | INHALATION_SPRAY | Freq: Four times a day (QID) | RESPIRATORY_TRACT | 0 refills | Status: DC | PRN

## 2021-01-28 NOTE — Discharge Instructions (Addendum)
Thank you for allowing me to care for you today in the Emergency Department.   You can use 2 puffs of your albuterol inhaler every 4-6 hours as needed for wheezing, shortness of breath, asthma exacerbation.  Will follow-up with primary care if you find that you are using your home albuterol inhaler multiple times per week.  You may need to have your medications readjusted if this is the case.  You can follow-up with primary care regarding panic attacks, but if you do feel that they are increasing in frequency or are becoming problematic, you can also follow-up with [facility].  Their contact information is listed above.  Return to the emergency department if you develop significant shortness of breath, if you pass out, persistent chest pain, if your fingers or lips turn blue, if you have left-sided neck pain with chest pain or shortness of breath, vomiting, or other new, concerning symptoms.

## 2021-01-28 NOTE — ED Triage Notes (Signed)
Patient presents with anxiety after an asthma attack. Patient is noted to be tremulous. Patient endorses hand and forearm numbness. Noted to be tachypneic. Patient states he has had these symptoms before, but it is worse this time.

## 2021-01-28 NOTE — ED Provider Notes (Signed)
[facility]-EMERGENCY DEPT Provider Note   CSN: [id number] Arrival date & time: 01/28/21  0137     History Chief Complaint  Patient presents with   Panic Attack    Kenneth Lane is a 35 y.o. male with a history of anxiety and asthma who presents the emergency department with a chief complaint of shortness of breath.  The patient reports that he awoke from sleep with shortness of breath, chest pain, palpitations, diaphoresis, paresthesias in his bilateral hands and subsequently developed numbness in his bilateral jaws and cheeks.  He characterizes the chest pain as sharp and stabbing.  Pain would last for 1 to 2 seconds before resolving spontaneously.  No known aggravating or alleviating factors.  Pain is nonradiating.  He has a history of similar pain with previous panic attacks.  He states that the symptoms felt consistent to previous panic attacks, but the numbness in the bilateral jaw and cheeks with new.Marland Kitchen  He reports that he then developed wheezing and cough, which felt similar to previous asthma attacks and he used his home albuterol inhaler with improvement.  Given the concern for jaw numbness, he presented to the emergency department for further evaluation.  In the ER, he reports that all symptoms have resolved.   No family history of sudden cardiac death.  He denies SI, HI, or auditory visual hallucinations.  He endorses tobacco use.  No history of illicit or recreational substance use, specifically cocaine.  Denies alcohol use.  The history is provided by medical records and the patient. No language interpreter was used.      Past Medical History:  Diagnosis Date   Anxiety    Asthma    Bradycardia    Seasonal allergies     Patient Active Problem List   Diagnosis Date Noted   Acute asthma exacerbation 07/23/2019   Emesis 07/23/2019   Asthma exacerbation 07/23/2019    Past Surgical History:  Procedure Laterality Date   DENTAL SURGERY          Family History  Problem Relation Age of Onset   Cancer Other    Hypertension Other    Clotting disorder Mother    Congestive Heart Failure Father    Kidney disease Father    Cancer Father     Social History   Tobacco Use   Smoking status: Every Day    Packs/day: 1.00    Pack years: 0.00    Types: Cigarettes   Smokeless tobacco: Never  Vaping Use   Vaping Use: Never used  Substance Use Topics   Alcohol use: Yes    Alcohol/week: 4.0 standard drinks    Types: 4 Shots of liquor per week   Drug use: Never    Home Medications Prior to Admission medications   Medication Sig Start Date End Date Taking? Authorizing Provider  albuterol (VENTOLIN HFA) 108 (90 Base) MCG/ACT inhaler Inhale 2 puffs into the lungs every 6 (six) hours as needed for wheezing or shortness of breath. 01/28/21  Yes Brisa Auth A, PA-C  omeprazole (PRILOSEC) 20 MG capsule Take 1 capsule (20 mg total) by mouth daily. 11/11/20  Yes Sabas Sous, MD  budesonide (PULMICORT) 0.25 MG/2ML nebulizer solution Take 2 mLs (0.25 mg total) by nebulization 2 (two) times daily. 07/26/19 12/09/19  Joseph Art, DO  escitalopram (LEXAPRO) 10 MG tablet Take 1 tablet (10 mg total) by mouth daily. 02/27/19 07/18/19  Janace Aris, NP  fluticasone (FLOVENT HFA) 110 MCG/ACT inhaler Take 2  puffs twice daily for 1-2 weeks when in your yellow zone. 11/01/18 07/18/19  Sharlene Dory, DO  ipratropium-albuterol (DUONEB) 0.5-2.5 (3) MG/3ML SOLN Take 3 mLs by nebulization 4 (four) times daily for 7 days. Patient not taking: Reported on 11/08/2019 07/26/19 12/09/19  Joseph Art, DO  levocetirizine (XYZAL) 5 MG tablet Take 1 tablet (5 mg total) by mouth every evening. 11/01/18 02/27/19  Sharlene Dory, DO  pantoprazole (PROTONIX) 40 MG tablet Take 1 tablet (40 mg total) by mouth 2 (two) times daily for 7 days. Patient not taking: Reported on 11/08/2019 07/26/19 12/09/19  Joseph Art, DO  sucralfate (CARAFATE) 1 G  tablet Take 1 tablet (1 g total) by mouth 4 (four) times daily. 06/08/11 10/20/11  Eber Hong, MD    Allergies    Shellfish-derived products and Orange concentrate [flavoring agent]  Review of Systems   Review of Systems  Constitutional:  Positive for diaphoresis. Negative for appetite change, chills, fatigue and fever.  HENT:  Negative for congestion and sore throat.   Eyes:  Negative for visual disturbance.  Respiratory:  Positive for shortness of breath. Negative for wheezing.   Cardiovascular:  Positive for chest pain. Negative for palpitations and leg swelling.  Gastrointestinal:  Negative for abdominal pain, constipation, diarrhea, nausea and vomiting.  Genitourinary:  Negative for dysuria, frequency, genital sores and urgency.  Musculoskeletal:  Negative for back pain, myalgias, neck pain and neck stiffness.  Skin:  Negative for rash and wound.  Allergic/Immunologic: Negative for immunocompromised state.  Neurological:  Positive for numbness. Negative for dizziness, syncope, speech difficulty, weakness and headaches.  Psychiatric/Behavioral:  Negative for confusion and self-injury.    Physical Exam Updated Vital Signs BP 119/79 (BP Location: Left Arm)   Pulse 69   Temp 97.8 F (36.6 C) (Oral)   Resp 16   SpO2 97%   Physical Exam Vitals and nursing note reviewed.  Constitutional:      General: He is not in acute distress.    Appearance: He is well-developed. He is not ill-appearing, toxic-appearing or diaphoretic.  HENT:     Head: Normocephalic.     Mouth/Throat:     Mouth: Mucous membranes are moist.  Eyes:     Conjunctiva/sclera: Conjunctivae normal.  Cardiovascular:     Rate and Rhythm: Normal rate and regular rhythm.     Pulses: Normal pulses.     Heart sounds: Normal heart sounds. No murmur heard.   No friction rub. No gallop.     Comments: Peripheral pulses are 2+ and symmetric. Pulmonary:     Effort: Pulmonary effort is normal. No respiratory distress.      Breath sounds: No stridor. No wheezing, rhonchi or rales.     Comments: Lungs are clear to auscultation bilaterally.  No increased work of breathing. Chest:     Chest wall: No tenderness.  Abdominal:     General: There is no distension.     Palpations: Abdomen is soft. There is no mass.     Tenderness: There is no abdominal tenderness. There is no right CVA tenderness, left CVA tenderness, guarding or rebound.     Hernia: No hernia is present.  Musculoskeletal:        General: No tenderness.     Cervical back: Neck supple.     Right lower leg: No edema.     Left lower leg: No edema.  Skin:    General: Skin is warm and dry.     Coloration: Skin is  not jaundiced or pale.  Neurological:     General: No focal deficit present.     Mental Status: He is alert.  Psychiatric:        Behavior: Behavior normal.    ED Results / Procedures / Treatments   Labs (all labs ordered are listed, but only abnormal results are displayed) Labs Reviewed - No data to display  EKG EKG Interpretation  Date/Time:  Tuesday January 28 2021 03:09:04 EDT Ventricular Rate:  76 PR Interval:  148 QRS Duration: 97 QT Interval:  381 QTC Calculation: 429 R Axis:   53 Text Interpretation: Sinus rhythm Normal ECG Confirmed by Gilda Crease [phone]) on 01/28/2021 7:26:51 AM  Radiology No results found.  Procedures Procedures   Medications Ordered in ED Medications - No data to display  ED Course  I have reviewed the triage vital signs and the nursing notes.  Pertinent labs & imaging results that were available during my care of the patient were reviewed by me and considered in my medical decision making (see chart for details).    MDM Rules/Calculators/A&P                           35 year old male with a history of anxiety and asthma who presents to the emergency department with an episode of sharp, stabbing chest pain, shortness of breath, palpitations, diaphoresis, paresthesias in the  bilateral hands, and numbness in the bilateral jaw.  He states that aside from the jaw numbness, that all symptoms are consistent with previous panic attacks.  Following this initial onset of symptoms, he then developed some wheezing and shortness of breath, that felt consistent with previous asthma exacerbations.  He treated his symptoms with albuterol and they resolved.  Patient is asymptomatic at this time.  Vital signs are stable. EKG with sinus rhythm. The patient was discussed with Dr. Blinda Leatherwood, attending physician.  Symptoms sound atypical for ACS.  He is PERC negative.  Doubt aortic dissection, esophageal rupture, tension pneumothorax.  Physical exam is overall reassuring at this time.  He denies HI, SI, auditory visual hallucinations.  Patient is requesting a refill of his home albuterol, which has been provided.  We will provide him with a referral to [facility].  He will follow-up with his primary care provider.  He is hemodynamically stable in no acute distress.  Safe for discharge to home with outpatient follow-up as discussed.  Final Clinical Impression(s) / ED Diagnoses Final diagnoses:  Panic attack  Exacerbation of asthma, unspecified asthma severity, unspecified whether persistent    Rx / DC Orders ED Discharge Orders          Ordered    albuterol (VENTOLIN HFA) 108 (90 Base) MCG/ACT inhaler  Every 6 hours PRN        01/28/21 0326             Frederik Pear A, PA-C 01/28/21 0727    Gilda Crease, MD 01/29/21 [phone]

## 2021-02-01 ENCOUNTER — Emergency Department (HOSPITAL_COMMUNITY): Payer: Medicare Other

## 2021-02-01 ENCOUNTER — Encounter (HOSPITAL_COMMUNITY): Payer: Self-pay

## 2021-02-01 ENCOUNTER — Other Ambulatory Visit: Payer: Self-pay

## 2021-02-01 ENCOUNTER — Emergency Department (HOSPITAL_COMMUNITY)
Admission: EM | Admit: 2021-02-01 | Discharge: 2021-02-02 | Disposition: A | Discharge status: Home / Self Care | Payer: Medicare Other | Attending: Emergency Medicine | Admitting: Emergency Medicine

## 2021-02-01 DIAGNOSIS — Z5321 Procedure and treatment not carried out due to patient leaving prior to being seen by health care provider: Secondary | ICD-10-CM | POA: Diagnosis not present

## 2021-02-01 DIAGNOSIS — R0602 Shortness of breath: Secondary | ICD-10-CM | POA: Diagnosis not present

## 2021-02-01 DIAGNOSIS — R0789 Other chest pain: Secondary | ICD-10-CM | POA: Diagnosis not present

## 2021-02-01 DIAGNOSIS — R079 Chest pain, unspecified: Secondary | ICD-10-CM | POA: Diagnosis not present

## 2021-02-01 DIAGNOSIS — R200 Anesthesia of skin: Secondary | ICD-10-CM | POA: Diagnosis not present

## 2021-02-01 LAB — CBC
HCT: 45.1 % (ref 39.0–52.0)
Hemoglobin: 15.6 g/dL (ref 13.0–17.0)
MCH: 29.4 pg (ref 26.0–34.0)
MCHC: 34.6 g/dL (ref 30.0–36.0)
MCV: 85.1 fL (ref 80.0–100.0)
Platelets: 239 10*3/uL (ref 150–400)
RBC: 5.3 MIL/uL (ref 4.22–5.81)
RDW: 13.3 % (ref 11.5–15.5)
WBC: 6.4 10*3/uL (ref 4.0–10.5)
nRBC: 0 % (ref 0.0–0.2)

## 2021-02-01 LAB — BASIC METABOLIC PANEL
Anion gap: 7 (ref 5–15)
BUN: 19 mg/dL (ref 6–20)
CO2: 26 mmol/L (ref 22–32)
Calcium: 9.6 mg/dL (ref 8.9–10.3)
Chloride: 106 mmol/L (ref 98–111)
Creatinine, Ser: 0.94 mg/dL (ref 0.61–1.24)
GFR, Estimated: >60 mL/min (ref >60)
Glucose, Bld: 107 mg/dL — ABNORMAL HIGH (ref 70–99)
Potassium: 3.8 mmol/L (ref 3.5–5.1)
Sodium: 139 mmol/L (ref 135–145)

## 2021-02-01 LAB — TROPONIN I (HIGH SENSITIVITY): Troponin I (High Sensitivity): 5 ng/L (ref <18)

## 2021-02-01 NOTE — ED Triage Notes (Signed)
Pt reports left sided chest pain and left arm numbness that began about 20 minutes ago.

## 2021-02-01 NOTE — ED Provider Notes (Signed)
Emergency Medicine Provider Triage Evaluation Note  Kenneth Lane , a 35 y.o. male  was evaluated in triage.  Pt complains of left arm numbness   Review of Systems  Positive:  Negative: No fever  Physical Exam  BP 129/90 (BP Location: Left Arm)   Pulse 71   Temp 98.5 F (36.9 C) (Oral)   Ht 5\' 6"  (1.676 m)   Wt 66.7 kg   SpO2 97%   BMI 23.73 kg/m  Gen:   Awake, no distress   Resp:  Normal effort  MSK:   Moves extremities without difficulty  Other:    Medical Decision Making  Medically screening exam initiated at 11:51 PM.  Appropriate orders placed.  KAMRYN GAUTHIER was informed that the remainder of the evaluation will be completed by another provider, this initial triage assessment does not replace that evaluation, and the importance of remaining in the ED until their evaluation is complete.     Phoebe Sharps 02/01/21 2352    04/04/21, MD 02/02/21 04/05/21

## 2021-02-02 DIAGNOSIS — R079 Chest pain, unspecified: Secondary | ICD-10-CM | POA: Diagnosis not present

## 2021-02-02 LAB — TROPONIN I (HIGH SENSITIVITY): Troponin I (High Sensitivity): 4 ng/L (ref <18)

## 2021-02-02 NOTE — ED Notes (Signed)
Pt stated he can't wait and wants to leave.

## 2021-04-04 ENCOUNTER — Emergency Department (HOSPITAL_COMMUNITY)
Admission: EM | Admit: 2021-04-04 | Discharge: 2021-04-04 | Disposition: A | Discharge status: Home / Self Care | Payer: Medicare Other | Attending: Emergency Medicine | Admitting: Emergency Medicine

## 2021-04-04 ENCOUNTER — Encounter (HOSPITAL_COMMUNITY): Payer: Self-pay

## 2021-04-04 DIAGNOSIS — T7840XA Allergy, unspecified, initial encounter: Secondary | ICD-10-CM | POA: Diagnosis not present

## 2021-04-04 DIAGNOSIS — R002 Palpitations: Secondary | ICD-10-CM | POA: Insufficient documentation

## 2021-04-04 DIAGNOSIS — F17210 Nicotine dependence, cigarettes, uncomplicated: Secondary | ICD-10-CM | POA: Insufficient documentation

## 2021-04-04 DIAGNOSIS — J45909 Unspecified asthma, uncomplicated: Secondary | ICD-10-CM | POA: Insufficient documentation

## 2021-04-04 MED ORDER — EPINEPHRINE 0.3 MG/0.3ML IJ SOAJ: 0.3000 mg | INTRAMUSCULAR | 0 refills | Status: AC | PRN

## 2021-04-04 NOTE — ED Triage Notes (Signed)
Pt had first covid shot approx 1500, endorses tremors, palpitations w associated CP, sweaty palms. States he feels "okay for now," but wanted to be evaluated.   6/10 neck/chest pain

## 2021-04-04 NOTE — ED Notes (Signed)
Pt states that he does not want to wait, advised to return if symptoms worsen.

## 2021-04-04 NOTE — ED Notes (Signed)
Pt returned, states that his arm is hurting worse. Triage RN present when pt returned.

## 2021-04-04 NOTE — Discharge Instructions (Signed)
Your history and exam today are consistent with a minor allergic reaction to the shot you received today.  As her symptoms are totally resolved and you have been stable for over 4 hours, we feel it is safe for you to go home.  Given the reported transient throat symptoms, please fill the prescription for EpiPen and keep it with you in case you have further reaction.  Please be careful taking that shot in the future.  Please rest and stay hydrated.  We had a shared decision-making conversation and agreed that given your lack of symptoms and mild symptoms initially, we will hold on steroids at this time however if you develop new or worsened symptoms, please return to the nearest emergency department.

## 2021-04-09 NOTE — ED Provider Notes (Signed)
[name] EMERGENCY DEPARTMENT Provider Note   CSN: [id number] Arrival date & time: 04/04/21  1614     History Chief Complaint  Patient presents with   reaction to covid shot    Kenneth Lane is a 35 y.o. male.  The history is provided by the patient and medical records. No language interpreter was used.  Allergic Reaction Presenting symptoms: no difficulty breathing, no difficulty swallowing, no itching, no rash, no swelling and no wheezing   Presenting symptoms comment:  Palpitations Severity:  Mild Prior allergic episodes:  No prior episodes Context: medications (covid shot)   Relieved by:  Rest Worsened by:  Nothing Ineffective treatments:  None tried     Past Medical History:  Diagnosis Date   Anxiety    Asthma    Bradycardia    Seasonal allergies     Patient Active Problem List   Diagnosis Date Noted   Acute asthma exacerbation 07/23/2019   Emesis 07/23/2019   Asthma exacerbation 07/23/2019    Past Surgical History:  Procedure Laterality Date   DENTAL SURGERY         Family History  Problem Relation Age of Onset   Cancer Other    Hypertension Other    Clotting disorder Mother    Congestive Heart Failure Father    Kidney disease Father    Cancer Father     Social History   Tobacco Use   Smoking status: Every Day    Packs/day: 1.00    Types: Cigarettes   Smokeless tobacco: Never  Vaping Use   Vaping Use: Never used  Substance Use Topics   Alcohol use: Yes    Alcohol/week: 4.0 standard drinks    Types: 4 Shots of liquor per week   Drug use: Never    Home Medications Prior to Admission medications   Medication Sig Start Date End Date Taking? Authorizing Provider  EPINEPHrine 0.3 mg/0.3 mL IJ SOAJ injection Inject 0.3 mg into the muscle as needed for anaphylaxis. 04/04/21  Yes Annibelle Brazie, Canary Brim, MD  albuterol (VENTOLIN HFA) 108 (90 Base) MCG/ACT inhaler Inhale 2 puffs into the lungs every 6 (six) hours as  needed for wheezing or shortness of breath. 01/28/21   McDonald, Mia A, PA-C  omeprazole (PRILOSEC) 20 MG capsule Take 1 capsule (20 mg total) by mouth daily. 11/11/20   Sabas Sous, MD  budesonide (PULMICORT) 0.25 MG/2ML nebulizer solution Take 2 mLs (0.25 mg total) by nebulization 2 (two) times daily. 07/26/19 12/09/19  Joseph Art, DO  escitalopram (LEXAPRO) 10 MG tablet Take 1 tablet (10 mg total) by mouth daily. 02/27/19 07/18/19  Dahlia Byes A, NP  fluticasone (FLOVENT HFA) 110 MCG/ACT inhaler Take 2 puffs twice daily for 1-2 weeks when in your yellow zone. 11/01/18 07/18/19  Sharlene Dory, DO  ipratropium-albuterol (DUONEB) 0.5-2.5 (3) MG/3ML SOLN Take 3 mLs by nebulization 4 (four) times daily for 7 days. Patient not taking: Reported on 11/08/2019 07/26/19 12/09/19  Joseph Art, DO  levocetirizine (XYZAL) 5 MG tablet Take 1 tablet (5 mg total) by mouth every evening. 11/01/18 02/27/19  Sharlene Dory, DO  pantoprazole (PROTONIX) 40 MG tablet Take 1 tablet (40 mg total) by mouth 2 (two) times daily for 7 days. Patient not taking: Reported on 11/08/2019 07/26/19 12/09/19  Joseph Art, DO  sucralfate (CARAFATE) 1 G tablet Take 1 tablet (1 g total) by mouth 4 (four) times daily. 06/08/11 10/20/11  Eber Hong, MD  Allergies    Shellfish-derived products and Orange concentrate [flavoring agent]  Review of Systems   Review of Systems  Constitutional:  Negative for chills, diaphoresis, fatigue and fever.  HENT:  Negative for congestion and trouble swallowing.   Eyes:  Negative for visual disturbance.  Respiratory:  Negative for cough, chest tightness, shortness of breath and wheezing.   Cardiovascular:  Positive for palpitations. Negative for chest pain and leg swelling.  Gastrointestinal:  Negative for abdominal pain, constipation, diarrhea, nausea and vomiting.  Genitourinary:  Negative for frequency.  Musculoskeletal:  Negative for back pain, neck pain and neck  stiffness.  Skin:  Negative for itching, rash and wound.  Neurological:  Negative for light-headedness and headaches.  Psychiatric/Behavioral:  Negative for agitation.   All other systems reviewed and are negative.  Physical Exam Updated Vital Signs BP 135/84   Pulse 60   Temp 98.9 F (37.2 C) (Oral)   Resp 18   SpO2 100%   Physical Exam Vitals and nursing note reviewed.  Constitutional:      General: He is not in acute distress.    Appearance: He is well-developed. He is not ill-appearing or toxic-appearing.  HENT:     Head: Normocephalic and atraumatic.     Nose: No congestion or rhinorrhea.     Mouth/Throat:     Mouth: Mucous membranes are moist.     Pharynx: No oropharyngeal exudate or posterior oropharyngeal erythema.  Eyes:     Extraocular Movements: Extraocular movements intact.     Conjunctiva/sclera: Conjunctivae normal.     Pupils: Pupils are equal, round, and reactive to light.  Cardiovascular:     Rate and Rhythm: Normal rate and regular rhythm.     Pulses: Normal pulses.     Heart sounds: No murmur heard. Pulmonary:     Effort: Pulmonary effort is normal. No respiratory distress.     Breath sounds: Normal breath sounds.  Abdominal:     Palpations: Abdomen is soft.     Tenderness: There is no abdominal tenderness.  Musculoskeletal:        General: No tenderness.     Cervical back: Neck supple. No tenderness.     Right lower leg: No edema.     Left lower leg: No edema.  Skin:    General: Skin is warm and dry.     Capillary Refill: Capillary refill takes less than 2 seconds.     Findings: No rash.  Neurological:     General: No focal deficit present.     Mental Status: He is alert and oriented to person, place, and time. Mental status is at baseline.     Sensory: No sensory deficit.     Motor: No weakness.  Psychiatric:        Mood and Affect: Mood normal.    ED Results / Procedures / Treatments   Labs (all labs ordered are listed, but only  abnormal results are displayed) Labs Reviewed - No data to display  EKG None  Radiology No results found.  Procedures Procedures   Medications Ordered in ED Medications - No data to display  ED Course  I have reviewed the triage vital signs and the nursing notes.  Pertinent labs & imaging results that were available during my care of the patient were reviewed by me and considered in my medical decision making (see chart for details).    MDM Rules/Calculators/A&P  TAVARI LOADHOLT is a 34 y.o. male with a past medical history significant for anxiety, asthma, and seasonal allergies who presents with palpitations and what he feels is a panic attack after getting COVID shot.  He reports that he got his COVID shot and then subsequently started having what he felt was a panic attack with sweaty palms, some chest tightness, and palpitations.  He says that he felt this for a while and then it has since resolved since arriving.  He otherwise feels at his baseline now.  He denies any preceding symptoms dysuria, chills congestion, cough.  No reported nausea, vomiting, skin changes, syncope, or lightheadedness.   On exam, lungs clear and chest nontender.  Abdomen nontender.  No focal neurologic deficits.  He reports symptoms of all resolved and he thinks was a panic attack after getting his COVID shot.  He has not taken any medicine help with his symptoms and he does not think he needs any medications at this time.  I suspect he did have a panic attack after getting his COVID shot and on exam I do not see any clear evidence of true anaphylaxis with no skin changes, nausea, vomiting, wheezing, shortness of breath, or reported sensation of throat closing or swelling.  No facial swelling seen.  We had a long shared decision made conversation about management and given he has been here for several hours without any recurrent symptoms and the symptoms have resolved, we think he  safe discharge home.  Patient agrees and will follow up.  We will give him a prescription for EpiPen just in case and he knows to using Benadryl if he has any itching or rash symptoms.  Patient with questions or concerns and was discharged in good condition    Final Clinical Impression(s) / ED Diagnoses Final diagnoses:  Allergic reaction, initial encounter    Rx / DC Orders ED Discharge Orders          Ordered    EPINEPHrine 0.3 mg/0.3 mL IJ SOAJ injection  As needed        04/04/21 2026             Brenan Modesto, Canary Brim, MD 04/09/21 [phone]

## 2021-04-29 ENCOUNTER — Emergency Department (HOSPITAL_COMMUNITY): Payer: Medicare Other

## 2021-04-29 ENCOUNTER — Encounter (HOSPITAL_COMMUNITY): Payer: Self-pay

## 2021-04-29 ENCOUNTER — Other Ambulatory Visit: Payer: Self-pay

## 2021-04-29 ENCOUNTER — Emergency Department (HOSPITAL_COMMUNITY)
Admission: EM | Admit: 2021-04-29 | Discharge: 2021-04-29 | Disposition: A | Discharge status: Home / Self Care | Payer: Medicare Other | Attending: Emergency Medicine | Admitting: Emergency Medicine

## 2021-04-29 DIAGNOSIS — R002 Palpitations: Secondary | ICD-10-CM

## 2021-04-29 DIAGNOSIS — Z7951 Long term (current) use of inhaled steroids: Secondary | ICD-10-CM | POA: Diagnosis not present

## 2021-04-29 DIAGNOSIS — R202 Paresthesia of skin: Secondary | ICD-10-CM | POA: Diagnosis not present

## 2021-04-29 DIAGNOSIS — F17210 Nicotine dependence, cigarettes, uncomplicated: Secondary | ICD-10-CM | POA: Diagnosis not present

## 2021-04-29 DIAGNOSIS — F410 Panic disorder [episodic paroxysmal anxiety] without agoraphobia: Secondary | ICD-10-CM | POA: Insufficient documentation

## 2021-04-29 DIAGNOSIS — R42 Dizziness and giddiness: Secondary | ICD-10-CM | POA: Diagnosis not present

## 2021-04-29 DIAGNOSIS — Z79899 Other long term (current) drug therapy: Secondary | ICD-10-CM | POA: Insufficient documentation

## 2021-04-29 DIAGNOSIS — R200 Anesthesia of skin: Secondary | ICD-10-CM | POA: Diagnosis not present

## 2021-04-29 DIAGNOSIS — J45909 Unspecified asthma, uncomplicated: Secondary | ICD-10-CM | POA: Insufficient documentation

## 2021-04-29 NOTE — ED Provider Notes (Signed)
[facility]-EMERGENCY DEPT Provider Note   CSN: [id number] Arrival date & time: 04/29/21  1840     History Chief Complaint  Patient presents with   Palpitations    Kenneth Lane is a 35 y.o. male.  The history is provided by the patient.  Palpitations Palpitations quality:  Fast Onset quality:  Sudden Progression:  Resolved Chronicity:  New Context: anxiety   Relieved by:  Nothing Worsened by:  Nothing Associated symptoms: numbness (tingling)   Associated symptoms: no back pain, no chest pain, no chest pressure, no cough, no shortness of breath and no vomiting       Past Medical History:  Diagnosis Date   Anxiety    Asthma    Bradycardia    Seasonal allergies     Patient Active Problem List   Diagnosis Date Noted   Acute asthma exacerbation 07/23/2019   Emesis 07/23/2019   Asthma exacerbation 07/23/2019    Past Surgical History:  Procedure Laterality Date   DENTAL SURGERY         Family History  Problem Relation Age of Onset   Cancer Other    Hypertension Other    Clotting disorder Mother    Congestive Heart Failure Father    Kidney disease Father    Cancer Father     Social History   Tobacco Use   Smoking status: Every Day    Packs/day: 1.00    Types: Cigarettes   Smokeless tobacco: Never  Vaping Use   Vaping Use: Never used  Substance Use Topics   Alcohol use: Yes    Alcohol/week: 4.0 standard drinks    Types: 4 Shots of liquor per week   Drug use: Never    Home Medications Prior to Admission medications   Medication Sig Start Date End Date Taking? Authorizing Provider  albuterol (VENTOLIN HFA) 108 (90 Base) MCG/ACT inhaler Inhale 2 puffs into the lungs every 6 (six) hours as needed for wheezing or shortness of breath. 01/28/21   McDonald, Mia A, PA-C  EPINEPHrine 0.3 mg/0.3 mL IJ SOAJ injection Inject 0.3 mg into the muscle as needed for anaphylaxis. 04/04/21   Tegeler, Canary Brim, MD  omeprazole (PRILOSEC) 20  MG capsule Take 1 capsule (20 mg total) by mouth daily. 11/11/20   Sabas Sous, MD  budesonide (PULMICORT) 0.25 MG/2ML nebulizer solution Take 2 mLs (0.25 mg total) by nebulization 2 (two) times daily. 07/26/19 12/09/19  Joseph Art, DO  escitalopram (LEXAPRO) 10 MG tablet Take 1 tablet (10 mg total) by mouth daily. 02/27/19 07/18/19  Dahlia Byes A, NP  fluticasone (FLOVENT HFA) 110 MCG/ACT inhaler Take 2 puffs twice daily for 1-2 weeks when in your yellow zone. 11/01/18 07/18/19  Sharlene Dory, DO  ipratropium-albuterol (DUONEB) 0.5-2.5 (3) MG/3ML SOLN Take 3 mLs by nebulization 4 (four) times daily for 7 days. Patient not taking: Reported on 11/08/2019 07/26/19 12/09/19  Joseph Art, DO  levocetirizine (XYZAL) 5 MG tablet Take 1 tablet (5 mg total) by mouth every evening. 11/01/18 02/27/19  Sharlene Dory, DO  pantoprazole (PROTONIX) 40 MG tablet Take 1 tablet (40 mg total) by mouth 2 (two) times daily for 7 days. Patient not taking: Reported on 11/08/2019 07/26/19 12/09/19  Joseph Art, DO  sucralfate (CARAFATE) 1 G tablet Take 1 tablet (1 g total) by mouth 4 (four) times daily. 06/08/11 10/20/11  Eber Hong, MD    Allergies    Shellfish-derived products and Orange concentrate [flavoring agent]  Review of Systems   Review of Systems  Constitutional:  Negative for chills and fever.  HENT:  Negative for ear pain and sore throat.   Eyes:  Negative for pain and visual disturbance.  Respiratory:  Negative for cough and shortness of breath.   Cardiovascular:  Positive for palpitations. Negative for chest pain.  Gastrointestinal:  Negative for abdominal pain and vomiting.  Genitourinary:  Negative for dysuria and hematuria.  Musculoskeletal:  Negative for arthralgias and back pain.  Skin:  Negative for color change and rash.  Neurological:  Positive for numbness (tingling). Negative for seizures and syncope.  Psychiatric/Behavioral:  The patient is nervous/anxious.    All other systems reviewed and are negative.  Physical Exam Updated Vital Signs BP 119/79 (BP Location: Left Arm)   Pulse 79   Temp 98.3 F (36.8 C) (Oral)   Resp 17   Ht 5\' 6"  (1.676 m)   Wt 63.5 kg   SpO2 97%   BMI 22.60 kg/m   Physical Exam Vitals and nursing note reviewed.  Constitutional:      Appearance: He is well-developed.  HENT:     Head: Normocephalic and atraumatic.  Eyes:     Conjunctiva/sclera: Conjunctivae normal.     Pupils: Pupils are equal, round, and reactive to light.  Cardiovascular:     Rate and Rhythm: Normal rate and regular rhythm.     Pulses: Normal pulses.     Heart sounds: Normal heart sounds. No murmur heard. Pulmonary:     Effort: Pulmonary effort is normal. No respiratory distress.     Breath sounds: Normal breath sounds.  Abdominal:     Palpations: Abdomen is soft.     Tenderness: There is no abdominal tenderness.  Musculoskeletal:     Cervical back: Neck supple.  Skin:    General: Skin is warm and dry.  Neurological:     General: No focal deficit present.     Mental Status: He is alert and oriented to person, place, and time.     Cranial Nerves: No cranial nerve deficit.     Sensory: No sensory deficit.     Motor: No weakness.     Coordination: Coordination normal.     Gait: Gait normal.  Psychiatric:        Mood and Affect: Mood normal.    ED Results / Procedures / Treatments   Labs (all labs ordered are listed, but only abnormal results are displayed) Labs Reviewed - No data to display  EKG EKG Interpretation  Date/Time:  Tuesday April 29 2021 20:25:08 EDT Ventricular Rate:  57 PR Interval:  152 QRS Duration: 102 QT Interval:  404 QTC Calculation: 394 R Axis:   47 Text Interpretation: Sinus rhythm Confirmed by [id number] ([id number]) on 04/29/2021 9:39:04 PM  Radiology DG Chest 2 View  Result Date: 04/29/2021 CLINICAL DATA:  Left arm tingling and numbness. EXAM: CHEST - 2 VIEW COMPARISON:  February 01, 2021 FINDINGS:  The heart size and mediastinal contours are within normal limits. Both lungs are clear. The visualized skeletal structures are unremarkable. IMPRESSION: No active cardiopulmonary disease. Electronically Signed   By: February 03, 2021 M.D.   On: 04/29/2021 21:15    Procedures Procedures   Medications Ordered in ED Medications - No data to display  ED Course  I have reviewed the triage vital signs and the nursing notes.  Pertinent labs & imaging results that were available during my care of the patient were reviewed by me and considered in  my medical decision making (see chart for details).    MDM Rules/Calculators/A&P                           Kenneth Lane is here after palpitation, anxiety attack.  Normal vitals.  No fever.  EKG shows sinus rhythm.  No ischemic changes.  Chest x-ray done prior to my evaluation shows no pneumonia or pneumothorax.  He was at work when he suddenly felt some palpitations, tingling throughout his body, anxious feeling.  Lasted for several minutes but now has resolved.  He is feeling lightheaded also.  Has a history of the same.  States he will get panic type attacks like this.  Not having any chest pain or shortness of breath.  Neurologically he is intact.  No concern for stroke on exam.  History and physical is overall unremarkable.  Given reassurance and discharged in ED in good condition.  This chart was dictated using voice recognition software.  Despite best efforts to proofread,  errors can occur which can change the documentation meaning.   Final Clinical Impression(s) / ED Diagnoses Final diagnoses:  Palpitations    Rx / DC Orders ED Discharge Orders     None        Virgina Norfolk, DO 04/29/21 2212

## 2021-04-29 NOTE — ED Triage Notes (Signed)
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 Pt states that he became short of breath and feels that he was having an anxiety attack.

## 2021-05-10 IMAGING — CR DG ANKLE COMPLETE 3+V*L*
3 series · 3 of 3 positions shown · non-contrast
Comparison: None.

CLINICAL DATA: Ankle pain after fall

EXAM:
LEFT ANKLE COMPLETE - 3+ VIEW

[ankle ap]
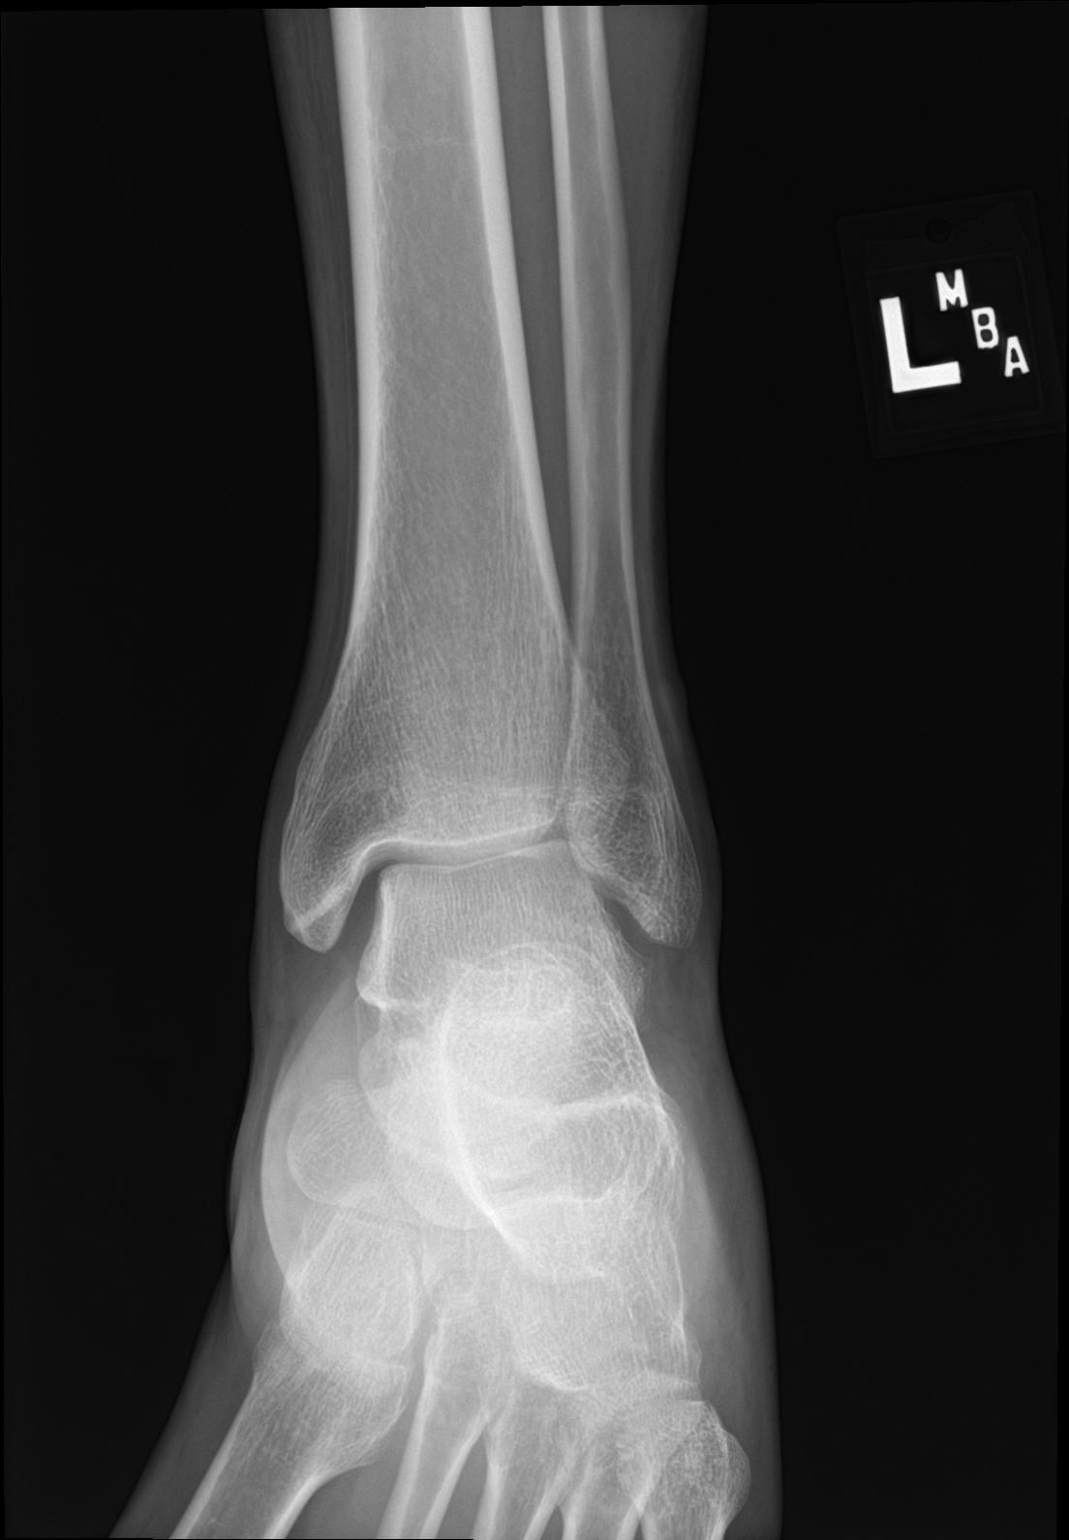

[ankle obl]
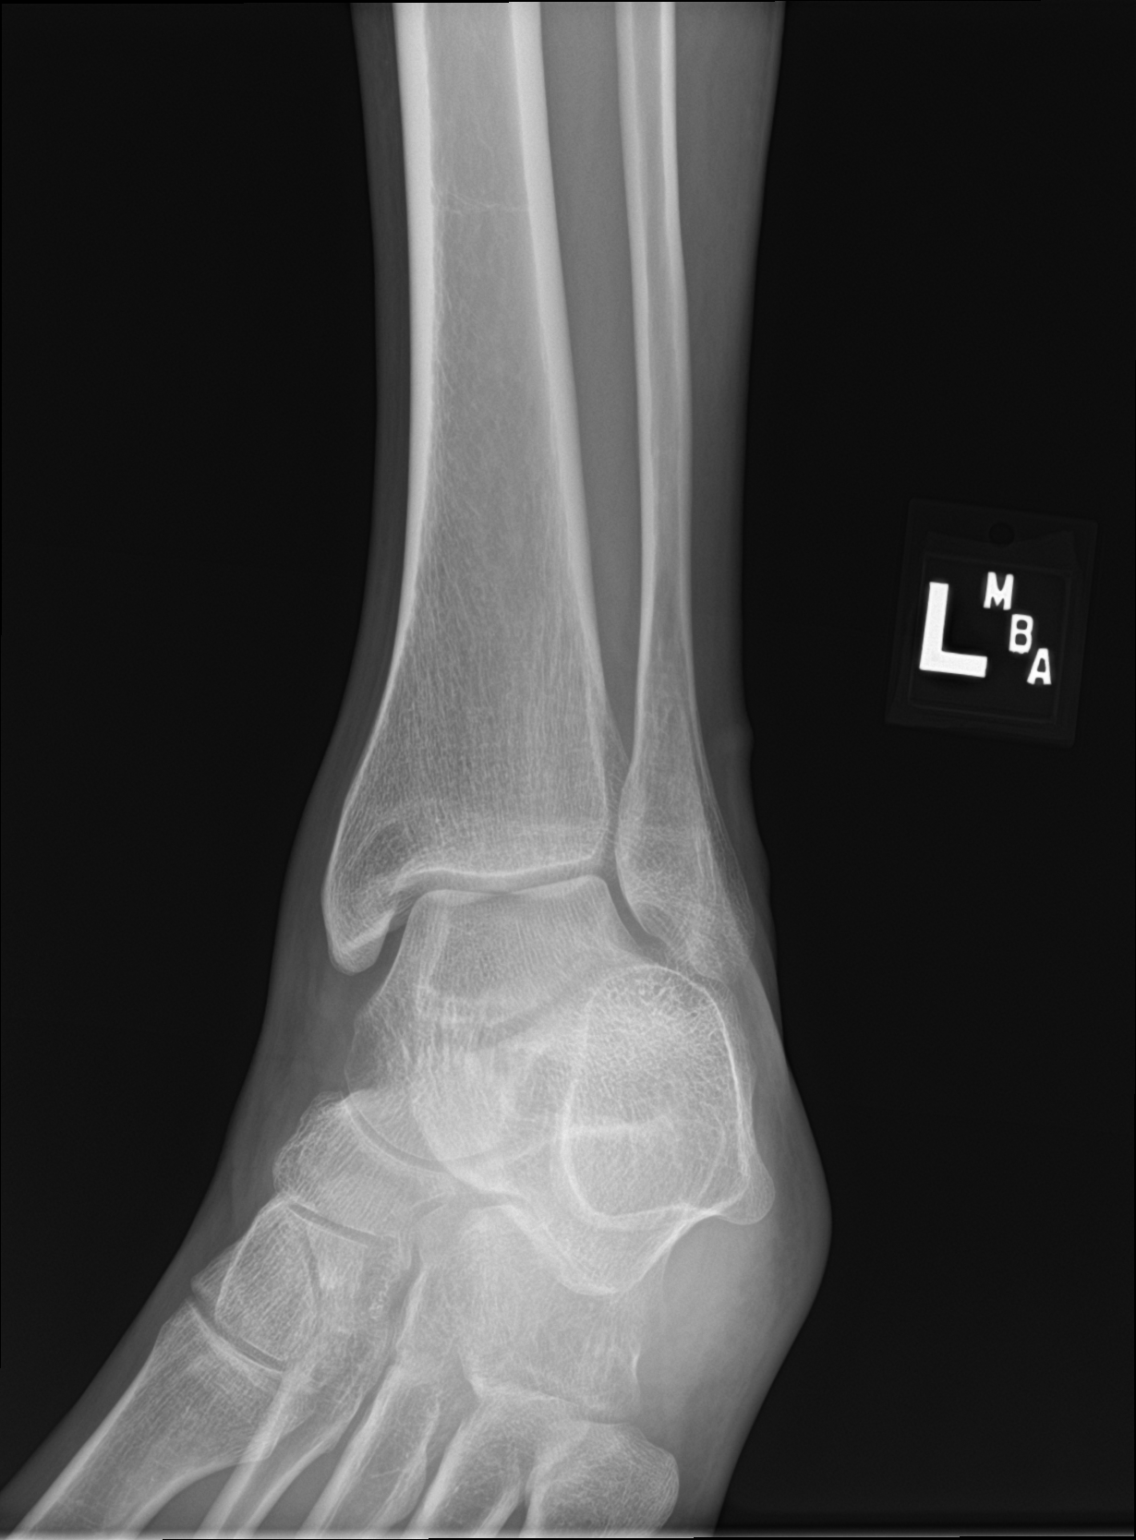

[ankle lat]
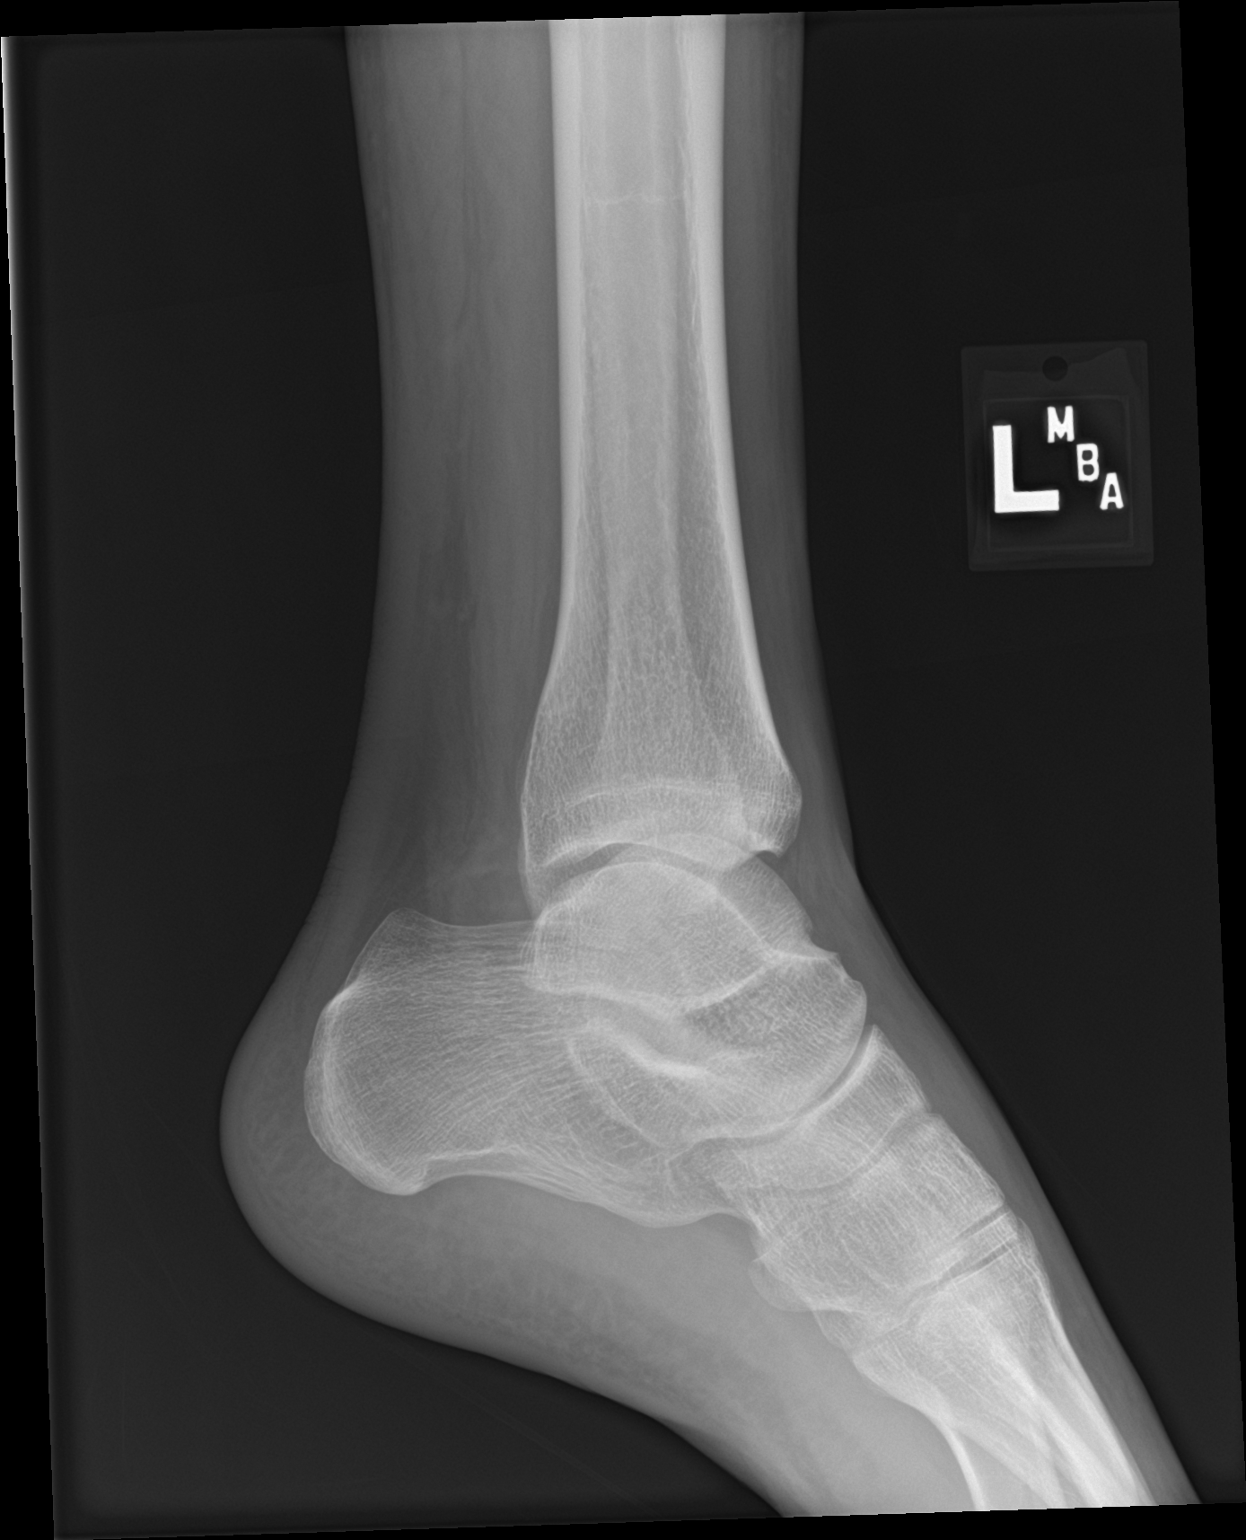

[3 of 3 positions shown; findings below may reference images not displayed]

FINDINGS: Osseous alignment is normal. Ankle mortise is symmetric. No fracture
line or displaced fracture fragment is seen. Visualized portions of
the hindfoot and midfoot appear intact and normally aligned. Soft
tissues about the LEFT ankle are unremarkable.
IMPRESSION: Negative.

## 2021-05-10 IMAGING — CR DG FOOT COMPLETE 3+V*L*
3 series · 3 of 3 positions shown · non-contrast
Comparison: None.

CLINICAL DATA: LEFT lateral knee, ankle and foot pain since fall
last night.

EXAM:
LEFT FOOT - COMPLETE 3+ VIEW

[foot ap]
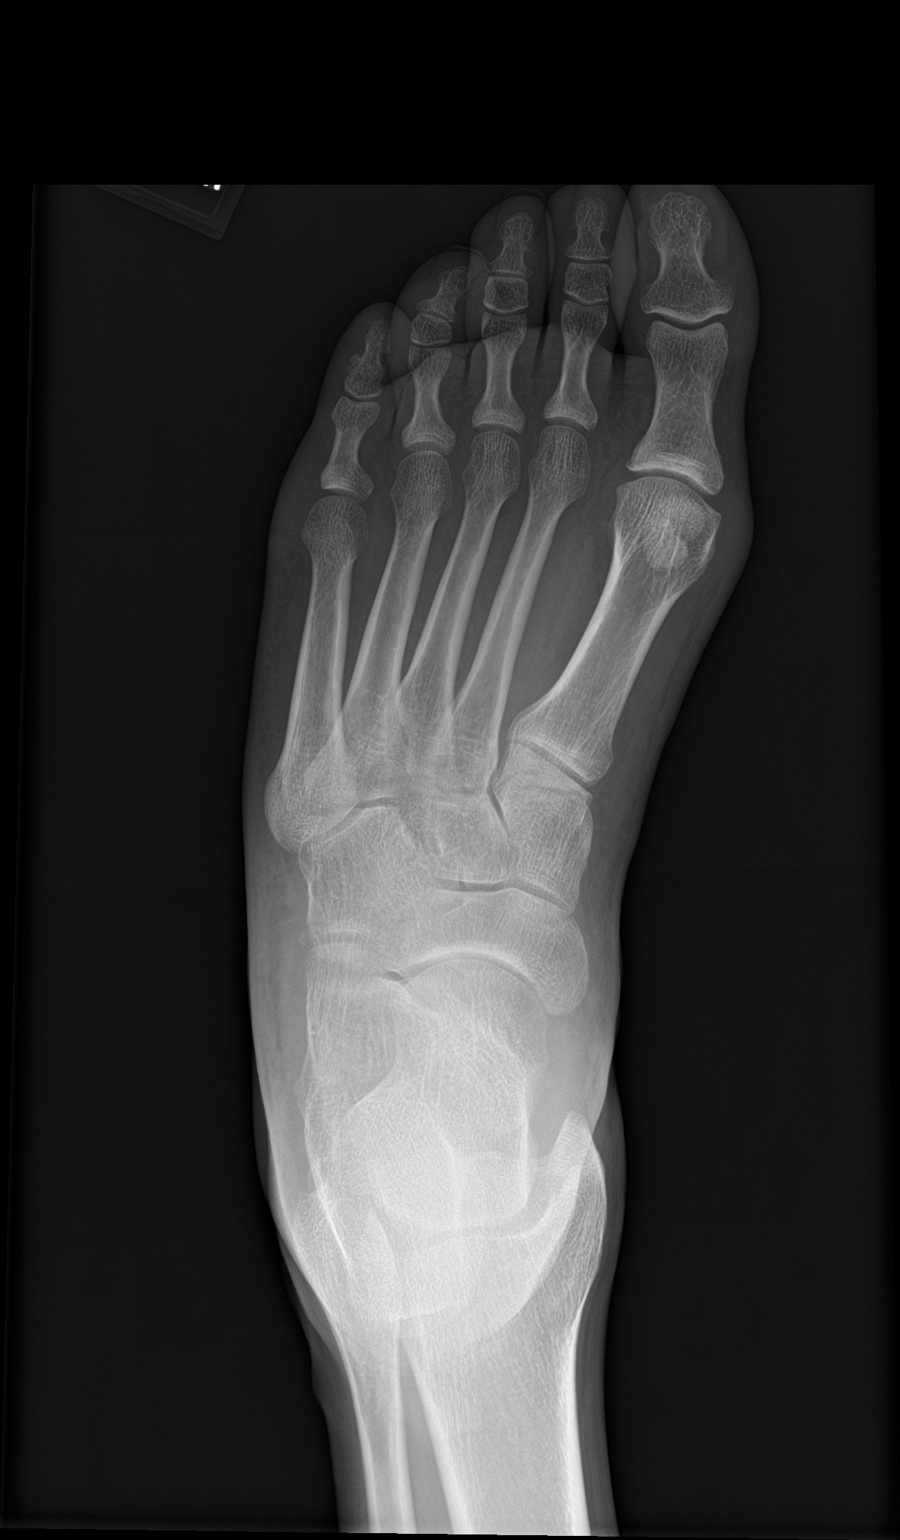

[foot obl]
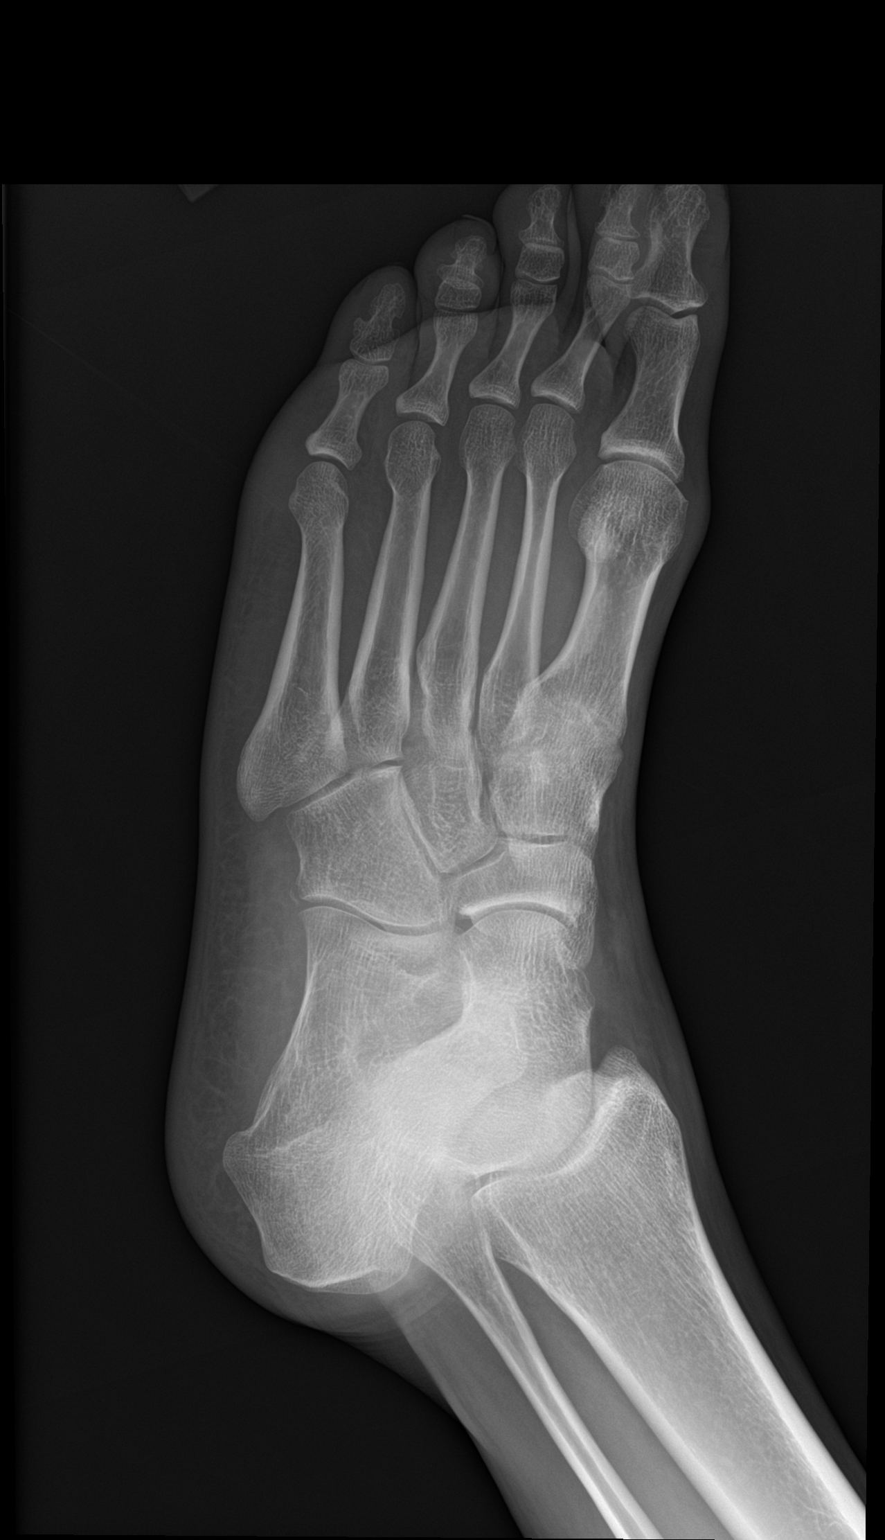

[foot lat]
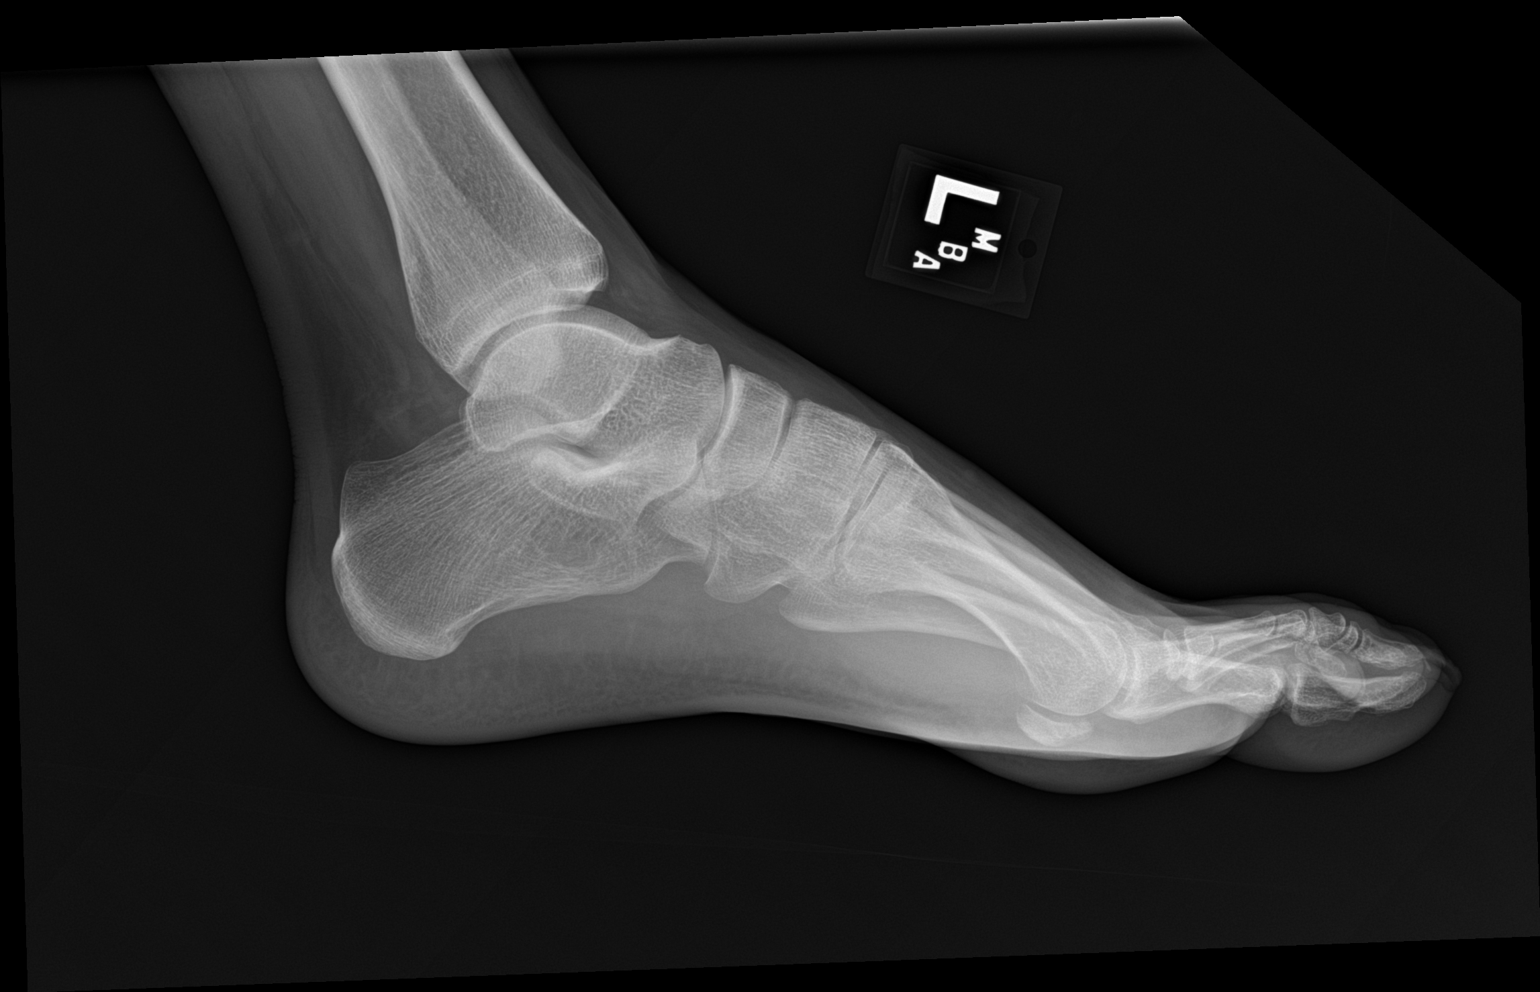

[3 of 3 positions shown; findings below may reference images not displayed]

FINDINGS: Osseous alignment is normal. No fracture line or displaced fracture
fragment is seen. Soft tissues about the LEFT foot are unremarkable.
IMPRESSION: Negative.

## 2021-05-27 ENCOUNTER — Encounter (HOSPITAL_COMMUNITY): Payer: Self-pay

## 2021-05-27 ENCOUNTER — Emergency Department (HOSPITAL_COMMUNITY): Payer: Medicare Other

## 2021-05-27 ENCOUNTER — Emergency Department (HOSPITAL_COMMUNITY)
Admission: EM | Admit: 2021-05-27 | Discharge: 2021-05-27 | Disposition: A | Discharge status: Home / Self Care | Payer: Medicare Other | Attending: Emergency Medicine | Admitting: Emergency Medicine

## 2021-05-27 DIAGNOSIS — Z5321 Procedure and treatment not carried out due to patient leaving prior to being seen by health care provider: Secondary | ICD-10-CM | POA: Insufficient documentation

## 2021-05-27 DIAGNOSIS — R0981 Nasal congestion: Secondary | ICD-10-CM | POA: Diagnosis present

## 2021-05-27 DIAGNOSIS — R059 Cough, unspecified: Secondary | ICD-10-CM | POA: Diagnosis not present

## 2021-05-27 DIAGNOSIS — J45909 Unspecified asthma, uncomplicated: Secondary | ICD-10-CM | POA: Diagnosis not present

## 2021-05-27 DIAGNOSIS — Z20822 Contact with and (suspected) exposure to covid-19: Secondary | ICD-10-CM | POA: Diagnosis not present

## 2021-05-27 DIAGNOSIS — J45901 Unspecified asthma with (acute) exacerbation: Secondary | ICD-10-CM | POA: Insufficient documentation

## 2021-05-27 LAB — RESP PANEL BY RT-PCR (FLU A&B, COVID) ARPGX2
Influenza A by PCR: NEGATIVE
Influenza B by PCR: NEGATIVE
SARS Coronavirus 2 by RT PCR: NEGATIVE

## 2021-05-27 MED ORDER — IPRATROPIUM-ALBUTEROL 0.5-2.5 (3) MG/3ML IN SOLN: 3.0000 mL | Freq: Once | RESPIRATORY_TRACT | Status: DC

## 2021-05-27 NOTE — ED Triage Notes (Signed)
Pt reports productive cough X2 days. Reports asthma flare up that started last night Reports using Nebulizer at home with no relief.  C/o cough and shob.   Denies chest pain or pain.  A/ox4 Ambulatory in triage.

## 2021-05-27 NOTE — ED Provider Notes (Signed)
Emergency Medicine Provider Triage Evaluation Note  Kenneth Lane , a 35 y.o. male  was evaluated in triage.  Pt complains of asthma flare for 3 days.  Patient reports he has been using his albuterol inhaler frequently without relief.  Associated nasal congestion mild sore throat and cough.  No lower extremity swelling or calf pain.  Review of Systems  Positive: Cough, nasal congestion, sore throat Negative: Chest pain, lower extremity swelling or calf pain  Physical Exam  BP 131/75 (BP Location: Left Arm)   Pulse 77   Temp 97.9 F (36.6 C) (Oral)   Resp 20   SpO2 99%  Gen:   Awake, no distress .  Intermittent dry cough Resp:  Normal effort no significant wheeze or crackle MSK:   Moves extremities without difficulty calf soft and nontender Other:  Mental status clear  Medical Decision Making  Medically screening exam initiated at 11:21 AM.  Appropriate orders placed.  Kenneth Lane was informed that the remainder of the evaluation will be completed by another provider, this initial triage assessment does not replace that evaluation, and the importance of remaining in the ED until their evaluation is complete.  Will obtain chest x-ray and influenza and COVID testing.   Kenneth Barrette, MD 05/27/21 1123

## 2021-05-28 ENCOUNTER — Encounter (HOSPITAL_COMMUNITY): Payer: Self-pay | Admitting: Emergency Medicine

## 2021-05-28 ENCOUNTER — Ambulatory Visit (HOSPITAL_COMMUNITY)
Admission: EM | Admit: 2021-05-28 | Discharge: 2021-05-28 | Disposition: A | Discharge status: Home / Self Care | Payer: Medicare Other | Attending: Family Medicine | Admitting: Family Medicine

## 2021-05-28 ENCOUNTER — Other Ambulatory Visit: Payer: Self-pay

## 2021-05-28 DIAGNOSIS — J069 Acute upper respiratory infection, unspecified: Secondary | ICD-10-CM

## 2021-05-28 DIAGNOSIS — J4521 Mild intermittent asthma with (acute) exacerbation: Secondary | ICD-10-CM

## 2021-05-28 MED ORDER — ALBUTEROL SULFATE HFA 108 (90 BASE) MCG/ACT IN AERS: 2.0000 | INHALATION_SPRAY | Freq: Four times a day (QID) | RESPIRATORY_TRACT | 0 refills | Status: AC | PRN

## 2021-05-28 MED ORDER — AMOXICILLIN-POT CLAVULANATE 875-125 MG PO TABS: 1.0000 | ORAL_TABLET | Freq: Two times a day (BID) | ORAL | 0 refills | Status: DC

## 2021-05-28 MED ORDER — PREDNISONE 10 MG PO TABS: 40.0000 mg | ORAL_TABLET | Freq: Every day | ORAL | 0 refills | Status: AC

## 2021-05-28 NOTE — ED Provider Notes (Signed)
[facility]    CSN: [id number] Arrival date & time: 05/28/21  0808      History   Chief Complaint Chief Complaint  Patient presents with   Shortness of Breath   Sore Throat   Cough    HPI Kenneth Lane is a 35 y.o. male.   Cough, congestion, wheezing,  Started over a week ago Not improving Reports a history of asthma and has been using inhaler frequently without improvement in symptoms Reports occasional wheezing and shortness of breath Denies any chest pain No vomiting, has been eating and drinking well No documented fevers Works at a nursing home Hasn't tried anything other than inhaler/nebulizer Now out of albuterol  Presented to ED yesterday, left without being seen COVID, flu testing were performed that were negative and chest x-ray also performed that was normal   Past Medical History:  Diagnosis Date   Anxiety    Asthma    Bradycardia    Seasonal allergies     Patient Active Problem List   Diagnosis Date Noted   Acute asthma exacerbation 07/23/2019   Emesis 07/23/2019   Asthma exacerbation 07/23/2019    Past Surgical History:  Procedure Laterality Date   DENTAL SURGERY         Home Medications    Prior to Admission medications   Medication Sig Start Date End Date Taking? Authorizing Provider  amoxicillin-clavulanate (AUGMENTIN) 875-125 MG tablet Take 1 tablet by mouth every 12 (twelve) hours. 05/28/21  Yes Tajh Livsey, Solmon Ice, DO  predniSONE (DELTASONE) 10 MG tablet Take 4 tablets (40 mg total) by mouth daily for 5 days. 05/28/21 06/02/21 Yes Faustina Gebert, Solmon Ice, DO  albuterol (VENTOLIN HFA) 108 (90 Base) MCG/ACT inhaler Inhale 2 puffs into the lungs every 6 (six) hours as needed for wheezing or shortness of breath. 05/28/21   Pistol Kessenich, Solmon Ice, DO  EPINEPHrine 0.3 mg/0.3 mL IJ SOAJ injection Inject 0.3 mg into the muscle as needed for anaphylaxis. 04/04/21   Tegeler, Canary Brim, MD  omeprazole (PRILOSEC) 20 MG capsule Take  1 capsule (20 mg total) by mouth daily. 11/11/20   Sabas Sous, MD  budesonide (PULMICORT) 0.25 MG/2ML nebulizer solution Take 2 mLs (0.25 mg total) by nebulization 2 (two) times daily. 07/26/19 12/09/19  Joseph Art, DO  escitalopram (LEXAPRO) 10 MG tablet Take 1 tablet (10 mg total) by mouth daily. 02/27/19 07/18/19  Dahlia Byes A, NP  fluticasone (FLOVENT HFA) 110 MCG/ACT inhaler Take 2 puffs twice daily for 1-2 weeks when in your yellow zone. 11/01/18 07/18/19  Sharlene Dory, DO  ipratropium-albuterol (DUONEB) 0.5-2.5 (3) MG/3ML SOLN Take 3 mLs by nebulization 4 (four) times daily for 7 days. Patient not taking: Reported on 11/08/2019 07/26/19 12/09/19  Joseph Art, DO  levocetirizine (XYZAL) 5 MG tablet Take 1 tablet (5 mg total) by mouth every evening. 11/01/18 02/27/19  Sharlene Dory, DO  pantoprazole (PROTONIX) 40 MG tablet Take 1 tablet (40 mg total) by mouth 2 (two) times daily for 7 days. Patient not taking: Reported on 11/08/2019 07/26/19 12/09/19  Joseph Art, DO  sucralfate (CARAFATE) 1 G tablet Take 1 tablet (1 g total) by mouth 4 (four) times daily. 06/08/11 10/20/11  Eber Hong, MD    Family History Family History  Problem Relation Age of Onset   Cancer Other    Hypertension Other    Clotting disorder Mother    Congestive Heart Failure Father    Kidney disease Father  Cancer Father     Social History Social History   Tobacco Use   Smoking status: Every Day    Packs/day: 1.00    Types: Cigarettes   Smokeless tobacco: Never  Vaping Use   Vaping Use: Never used  Substance Use Topics   Alcohol use: Yes    Alcohol/week: 4.0 standard drinks    Types: 4 Shots of liquor per week   Drug use: Never     Allergies   Shellfish-derived products and Orange concentrate [flavoring agent]   Review of Systems Review of Systems  All other systems reviewed and are negative. Per HPI  Physical Exam Triage Vital Signs ED Triage Vitals  Enc  Vitals Group     BP 05/28/21 0837 120/77     Pulse Rate 05/28/21 0837 (!) 54     Resp 05/28/21 0837 16     Temp 05/28/21 0837 97.9 F (36.6 C)     Temp Source 05/28/21 0837 Oral     SpO2 05/28/21 0837 98 %     Weight --      Height --      Head Circumference --      Peak Flow --      Pain Score 05/28/21 0835 0     Pain Loc --      Pain Edu? --      Excl. in GC? --    No data found.  Updated Vital Signs BP 120/77 (BP Location: Left Arm)   Pulse (!) 54   Temp 97.9 F (36.6 C) (Oral)   Resp 16   SpO2 98%   Visual Acuity Right Eye Distance:   Left Eye Distance:   Bilateral Distance:    Right Eye Near:   Left Eye Near:    Bilateral Near:     Physical Exam Constitutional:      General: He is not in acute distress.    Appearance: He is well-developed. He is not ill-appearing or toxic-appearing.  HENT:     Head: Normocephalic and atraumatic.  Eyes:     Comments: Normal conjunctiva  Cardiovascular:     Rate and Rhythm: Normal rate and regular rhythm.  Pulmonary:     Effort: Pulmonary effort is normal.     Breath sounds: Wheezing (diffused, scattered) present. No decreased breath sounds.  Musculoskeletal:     Cervical back: Normal range of motion and neck supple.  Lymphadenopathy:     Cervical: No cervical adenopathy.  Skin:    General: Skin is warm and dry.  Neurological:     Mental Status: He is alert and oriented to person, place, and time.     UC Treatments / Results  Labs (all labs ordered are listed, but only abnormal results are displayed) Labs Reviewed - No data to display  EKG   Radiology DG Chest 2 View  Result Date: 05/27/2021 CLINICAL DATA:  Cough, asthma EXAM: CHEST - 2 VIEW COMPARISON:  Radiograph 04/29/2021 FINDINGS: The heart size and mediastinal contours are within normal limits.No focal airspace disease. No pleural effusion or pneumothorax.No acute osseous abnormality. IMPRESSION: No evidence of acute cardiopulmonary disease.  Electronically Signed   By: Caprice Renshaw M.D.   On: 05/27/2021 11:38    Procedures Procedures (including critical care time)  Medications Ordered in UC Medications - No data to display  Initial Impression / Assessment and Plan / UC Course  I have reviewed the triage vital signs and the nursing notes.  Pertinent labs & imaging results that  were available during my care of the patient were reviewed by me and considered in my medical decision making (see chart for details).     At the ED had negative COVID, flu, normal chest x-ray.  He does have some scattered wheezing today.  Will refill albuterol inhaler and treat with prednisone for 5 days.  Given that he is not having improvement in his congestion, will also treat for bacterial sinus infection with Augmentin.  Given ED precautions and a note for work.   Final Clinical Impressions(s) / UC Diagnoses   Final diagnoses:  Upper respiratory tract infection, unspecified type  Mild intermittent asthma with acute exacerbation     Discharge Instructions      Your COVID test from the emergency room was negative.  As we discussed, we will go ahead and treat you for an asthma exacerbation and a bacterial sinusitis.  I have sent a prescription to the pharmacy for a steroid that should help with your lung inflammation and an antibiotic.  I have also put in a refill for your albuterol inhaler.  If you have worsening of your symptoms, especially if you have difficulty breathing and it is not improving with your inhaler, you should be seen at the emergency room right away.     ED Prescriptions     Medication Sig Dispense Auth. Provider   albuterol (VENTOLIN HFA) 108 (90 Base) MCG/ACT inhaler Inhale 2 puffs into the lungs every 6 (six) hours as needed for wheezing or shortness of breath. 1 each Lynniah Janoski, Solmon Ice, DO   predniSONE (DELTASONE) 10 MG tablet Take 4 tablets (40 mg total) by mouth daily for 5 days. 20 tablet Ocean Schildt, Solmon Ice, DO    amoxicillin-clavulanate (AUGMENTIN) 875-125 MG tablet Take 1 tablet by mouth every 12 (twelve) hours. 14 tablet Amaka Gluth, Solmon Ice, DO      PDMP not reviewed this encounter.   Unknown Jim, DO 05/28/21 [phone]

## 2021-05-28 NOTE — ED Triage Notes (Signed)
Pt presents with cough, sob, and sore throat xs 1 week.

## 2021-05-28 NOTE — Discharge Instructions (Signed)
Your COVID test from the emergency room was negative.  As we discussed, we will go ahead and treat you for an asthma exacerbation and a bacterial sinusitis.  I have sent a prescription to the pharmacy for a steroid that should help with your lung inflammation and an antibiotic.  I have also put in a refill for your albuterol inhaler.  If you have worsening of your symptoms, especially if you have difficulty breathing and it is not improving with your inhaler, you should be seen at the emergency room right away.

## 2021-06-08 ENCOUNTER — Emergency Department (HOSPITAL_COMMUNITY)
Admission: EM | Admit: 2021-06-08 | Discharge: 2021-06-08 | Disposition: A | Discharge status: Home / Self Care | Payer: Medicare Other | Attending: Emergency Medicine | Admitting: Emergency Medicine

## 2021-06-08 ENCOUNTER — Emergency Department (HOSPITAL_COMMUNITY): Payer: Medicare Other

## 2021-06-08 ENCOUNTER — Encounter (HOSPITAL_COMMUNITY): Payer: Self-pay

## 2021-06-08 DIAGNOSIS — R001 Bradycardia, unspecified: Secondary | ICD-10-CM | POA: Diagnosis not present

## 2021-06-08 DIAGNOSIS — R1110 Vomiting, unspecified: Secondary | ICD-10-CM | POA: Insufficient documentation

## 2021-06-08 DIAGNOSIS — Z5321 Procedure and treatment not carried out due to patient leaving prior to being seen by health care provider: Secondary | ICD-10-CM | POA: Diagnosis not present

## 2021-06-08 DIAGNOSIS — R079 Chest pain, unspecified: Secondary | ICD-10-CM | POA: Insufficient documentation

## 2021-06-08 DIAGNOSIS — I10 Essential (primary) hypertension: Secondary | ICD-10-CM | POA: Diagnosis not present

## 2021-06-08 DIAGNOSIS — R0789 Other chest pain: Secondary | ICD-10-CM | POA: Diagnosis not present

## 2021-06-08 DIAGNOSIS — R1111 Vomiting without nausea: Secondary | ICD-10-CM | POA: Diagnosis not present

## 2021-06-08 LAB — BASIC METABOLIC PANEL
Anion gap: 7 (ref 5–15)
BUN: 17 mg/dL (ref 6–20)
CO2: 23 mmol/L (ref 22–32)
Calcium: 9 mg/dL (ref 8.9–10.3)
Chloride: 108 mmol/L (ref 98–111)
Creatinine, Ser: 0.86 mg/dL (ref 0.61–1.24)
GFR, Estimated: >60 mL/min (ref >60)
Glucose, Bld: 104 mg/dL — ABNORMAL HIGH (ref 70–99)
Potassium: 3.7 mmol/L (ref 3.5–5.1)
Sodium: 138 mmol/L (ref 135–145)

## 2021-06-08 LAB — CBC
HCT: 43.4 % (ref 39.0–52.0)
Hemoglobin: 15.2 g/dL (ref 13.0–17.0)
MCH: 30 pg (ref 26.0–34.0)
MCHC: 35 g/dL (ref 30.0–36.0)
MCV: 85.6 fL (ref 80.0–100.0)
Platelets: 253 10*3/uL (ref 150–400)
RBC: 5.07 MIL/uL (ref 4.22–5.81)
RDW: 13.7 % (ref 11.5–15.5)
WBC: 6.4 10*3/uL (ref 4.0–10.5)
nRBC: 0 % (ref 0.0–0.2)

## 2021-06-08 LAB — TROPONIN I (HIGH SENSITIVITY): Troponin I (High Sensitivity): 10 ng/L (ref <18)

## 2021-06-08 NOTE — ED Notes (Signed)
Called pt x3 for vitals, no response. 

## 2021-06-08 NOTE — ED Triage Notes (Signed)
Pt comes via [facility] for CP and vomiting several times, denies SOB

## 2021-06-08 NOTE — ED Notes (Signed)
Pt called for vitals recheck. No response. °

## 2021-06-12 ENCOUNTER — Other Ambulatory Visit: Payer: Self-pay | Admitting: Family Medicine

## 2021-07-06 ENCOUNTER — Other Ambulatory Visit: Payer: Self-pay

## 2021-07-06 ENCOUNTER — Emergency Department (HOSPITAL_COMMUNITY): Payer: Medicare Other

## 2021-07-06 ENCOUNTER — Emergency Department (HOSPITAL_COMMUNITY)
Admission: EM | Admit: 2021-07-06 | Discharge: 2021-07-06 | Disposition: A | Discharge status: Home / Self Care | Payer: Medicare Other | Attending: Emergency Medicine | Admitting: Emergency Medicine

## 2021-07-06 ENCOUNTER — Encounter (HOSPITAL_COMMUNITY): Payer: Self-pay | Admitting: Emergency Medicine

## 2021-07-06 DIAGNOSIS — Z20822 Contact with and (suspected) exposure to covid-19: Secondary | ICD-10-CM | POA: Diagnosis not present

## 2021-07-06 DIAGNOSIS — Z5321 Procedure and treatment not carried out due to patient leaving prior to being seen by health care provider: Secondary | ICD-10-CM | POA: Diagnosis not present

## 2021-07-06 DIAGNOSIS — R079 Chest pain, unspecified: Secondary | ICD-10-CM | POA: Insufficient documentation

## 2021-07-06 DIAGNOSIS — R0602 Shortness of breath: Secondary | ICD-10-CM | POA: Insufficient documentation

## 2021-07-06 DIAGNOSIS — J45909 Unspecified asthma, uncomplicated: Secondary | ICD-10-CM | POA: Diagnosis not present

## 2021-07-06 DIAGNOSIS — R059 Cough, unspecified: Secondary | ICD-10-CM | POA: Insufficient documentation

## 2021-07-06 DIAGNOSIS — R0789 Other chest pain: Secondary | ICD-10-CM | POA: Diagnosis not present

## 2021-07-06 LAB — BASIC METABOLIC PANEL
Anion gap: 8 (ref 5–15)
BUN: 14 mg/dL (ref 6–20)
CO2: 26 mmol/L (ref 22–32)
Calcium: 9.5 mg/dL (ref 8.9–10.3)
Chloride: 104 mmol/L (ref 98–111)
Creatinine, Ser: 0.95 mg/dL (ref 0.61–1.24)
GFR, Estimated: >60 mL/min (ref >60)
Glucose, Bld: 103 mg/dL — ABNORMAL HIGH (ref 70–99)
Potassium: 3.9 mmol/L (ref 3.5–5.1)
Sodium: 138 mmol/L (ref 135–145)

## 2021-07-06 LAB — CBC WITH DIFFERENTIAL/PLATELET
Abs Immature Granulocytes: 0.02 10*3/uL (ref 0.00–0.07)
Basophils Absolute: 0 10*3/uL (ref 0.0–0.1)
Basophils Relative: 0 %
Eosinophils Absolute: 0.3 10*3/uL (ref 0.0–0.5)
Eosinophils Relative: 5 %
HCT: 44.5 % (ref 39.0–52.0)
Hemoglobin: 15 g/dL (ref 13.0–17.0)
Immature Granulocytes: 0 %
Lymphocytes Relative: 36 %
Lymphs Abs: 2.4 10*3/uL (ref 0.7–4.0)
MCH: 29.4 pg (ref 26.0–34.0)
MCHC: 33.7 g/dL (ref 30.0–36.0)
MCV: 87.1 fL (ref 80.0–100.0)
Monocytes Absolute: 0.8 10*3/uL (ref 0.1–1.0)
Monocytes Relative: 12 %
Neutro Abs: 3.2 10*3/uL (ref 1.7–7.7)
Neutrophils Relative %: 47 %
Platelets: 260 10*3/uL (ref 150–400)
RBC: 5.11 MIL/uL (ref 4.22–5.81)
RDW: 13.2 % (ref 11.5–15.5)
WBC: 6.8 10*3/uL (ref 4.0–10.5)
nRBC: 0 % (ref 0.0–0.2)

## 2021-07-06 LAB — RESP PANEL BY RT-PCR (FLU A&B, COVID) ARPGX2
Influenza A by PCR: NEGATIVE
Influenza B by PCR: NEGATIVE
SARS Coronavirus 2 by RT PCR: NEGATIVE

## 2021-07-06 LAB — TROPONIN I (HIGH SENSITIVITY): Troponin I (High Sensitivity): 7 ng/L

## 2021-07-06 NOTE — ED Triage Notes (Signed)
Pt reports cough, chest pain and SOB x 1 week.  He initially had cough, congestion, nausea, vomiting, fever, and body aches but other symptoms have seemed to improve.

## 2021-07-06 NOTE — ED Notes (Signed)
Questions and concerns addressed. Discharge teaching completed. Pt ambulatory upon discharge.

## 2021-07-06 NOTE — ED Provider Notes (Signed)
Emergency Medicine Provider Triage Evaluation Note  Kenneth Lane , a 35 y.o. male  was evaluated in triage.  Pt complains of chest pain and shortness of breath.  He states that he has been sick for about a week.  When this started it was fevers, cough, congestion, nausea, vomiting, headache, and body pains.  He states that he has been getting better since a few days ago however his chest pain and cough have been worsening.  He has a history of asthma, has not taken any albuterol recently.  He denies any rashes.  He did not get swab for flu or COVID when he started getting sick.  Review of Systems  Positive: See above Negative: See above  Physical Exam  BP 120/76   Pulse 72   Temp 98.8 F (37.1 C) (Oral)   Resp 18   SpO2 99%  Gen:   Awake, no distress   Resp:  Normal effort lungs ctab, frequent cough MSK:   Moves extremities without difficulty  Other:  Awake and alert, answers questions with out difficulty.   Medical Decision Making  Medically screening exam initiated at 6:52 PM.  Appropriate orders placed.  Kenneth Lane was informed that the remainder of the evaluation will be completed by another provider, this initial triage assessment does not replace that evaluation, and the importance of remaining in the ED until their evaluation is complete.  Note: Portions of this report may have been transcribed using voice recognition software. Every effort was made to ensure accuracy; however, inadvertent computerized transcription errors may be present    Kenneth Lane 07/06/21 1902    Kenneth Savoy, MD 07/06/21 [phone]

## 2021-07-06 NOTE — Discharge Instructions (Addendum)
Your chest pain is likely related to your previous viral infection.  X-ray is reassuring.  Blood work also looks good.  Follow-up with your doctor as needed

## 2022-01-04 ENCOUNTER — Encounter (HOSPITAL_COMMUNITY): Payer: Self-pay | Admitting: Emergency Medicine

## 2022-01-04 ENCOUNTER — Emergency Department (HOSPITAL_COMMUNITY)
Admission: EM | Admit: 2022-01-04 | Discharge: 2022-01-04 | Discharge status: Against Medical Advice | Payer: Medicare Other | Attending: Emergency Medicine | Admitting: Emergency Medicine

## 2022-01-04 ENCOUNTER — Other Ambulatory Visit: Payer: Self-pay

## 2022-01-04 ENCOUNTER — Emergency Department (HOSPITAL_COMMUNITY): Payer: Medicare Other

## 2022-01-04 DIAGNOSIS — J020 Streptococcal pharyngitis: Secondary | ICD-10-CM | POA: Insufficient documentation

## 2022-01-04 DIAGNOSIS — R059 Cough, unspecified: Secondary | ICD-10-CM | POA: Diagnosis present

## 2022-01-04 DIAGNOSIS — Z20822 Contact with and (suspected) exposure to covid-19: Secondary | ICD-10-CM | POA: Insufficient documentation

## 2022-01-04 DIAGNOSIS — R0602 Shortness of breath: Secondary | ICD-10-CM | POA: Diagnosis not present

## 2022-01-04 DIAGNOSIS — Z5321 Procedure and treatment not carried out due to patient leaving prior to being seen by health care provider: Secondary | ICD-10-CM | POA: Insufficient documentation

## 2022-01-04 DIAGNOSIS — J45909 Unspecified asthma, uncomplicated: Secondary | ICD-10-CM | POA: Insufficient documentation

## 2022-01-04 LAB — SARS CORONAVIRUS 2 BY RT PCR: SARS Coronavirus 2 by RT PCR: NEGATIVE

## 2022-01-04 LAB — GROUP A STREP BY PCR: Group A Strep by PCR: DETECTED — AB

## 2022-01-04 MED ORDER — ALBUTEROL SULFATE HFA 108 (90 BASE) MCG/ACT IN AERS
2.0000 | INHALATION_SPRAY | RESPIRATORY_TRACT | Status: DC | PRN
  Administered 2022-01-04: 2 via RESPIRATORY_TRACT
  Filled 2022-01-04: qty 6.7

## 2022-01-04 NOTE — ED Notes (Signed)
Called x 1 no answer

## 2022-01-04 NOTE — ED Notes (Signed)
Called pt x2, no response. 

## 2022-01-04 NOTE — ED Triage Notes (Signed)
Pt brought to ED by [facility] with c/o cough, sore throat and shortness of breath since last night. Family member recently sick with similar symptoms. Has hx of asthma. Lung sounds clear bilaterally.    EMS Vitals BP 144/84 HR 84 RR 14 SPO2 98%

## 2022-01-05 ENCOUNTER — Ambulatory Visit (HOSPITAL_COMMUNITY)
Admission: EM | Admit: 2022-01-05 | Discharge: 2022-01-05 | Disposition: A | Discharge status: Home / Self Care | Payer: Medicare Other | Attending: Internal Medicine | Admitting: Internal Medicine

## 2022-01-05 ENCOUNTER — Encounter (HOSPITAL_COMMUNITY): Payer: Self-pay

## 2022-01-05 DIAGNOSIS — J029 Acute pharyngitis, unspecified: Secondary | ICD-10-CM | POA: Diagnosis present

## 2022-01-05 DIAGNOSIS — R509 Fever, unspecified: Secondary | ICD-10-CM | POA: Insufficient documentation

## 2022-01-05 DIAGNOSIS — J020 Streptococcal pharyngitis: Secondary | ICD-10-CM | POA: Diagnosis present

## 2022-01-05 DIAGNOSIS — J039 Acute tonsillitis, unspecified: Secondary | ICD-10-CM | POA: Insufficient documentation

## 2022-01-05 HISTORY — DX: Gastro-esophageal reflux disease without esophagitis: K21.9

## 2022-01-05 LAB — POCT RAPID STREP A, ED / UC: Streptococcus, Group A Screen (Direct): NEGATIVE

## 2022-01-05 LAB — POCT INFECTIOUS MONO SCREEN, ED / UC: Mono Screen: NEGATIVE

## 2022-01-05 MED ORDER — IBUPROFEN 800 MG PO TABS
800.0000 mg | ORAL_TABLET | Freq: Once | ORAL | Status: AC
  Administered 2022-01-05: 800 mg via ORAL

## 2022-01-05 MED ORDER — IBUPROFEN 800 MG PO TABS
ORAL_TABLET | ORAL | Status: AC
  Filled 2022-01-05: qty 1

## 2022-01-05 MED ORDER — AMOXICILLIN-POT CLAVULANATE 875-125 MG PO TABS: 1.0000 | ORAL_TABLET | Freq: Two times a day (BID) | ORAL | 0 refills | Status: DC

## 2022-01-05 NOTE — Discharge Instructions (Signed)
Your strep and mono were negative.  We are starting Augmentin to cover for an infection.  Take this twice daily for 7 days.  Gargle with warm salt water and use Tylenol ibuprofen for pain.  If your symptoms do not improving quickly with antibiotics please return for reevaluation.  If any point anything worsens and you have shortness of breath, change in your voice, difficulty swallowing especially your saliva, nausea/vomiting, fever not responding to medication you need to go to the emergency room immediately.

## 2022-01-05 NOTE — ED Provider Notes (Signed)
[facility]    CSN: [id number] Arrival date & time: 01/05/22  1832      History   Chief Complaint Chief Complaint  Patient presents with   Generalized Body Aches   Sore Throat    HPI Kenneth Lane is a 36 y.o. male.   Patient presents today with a 1 week history of sore throat, body aches, nasal congestion, cough.  He reports sore throat pain is rated 10 on a 0-10 pain scale, localized to posterior oropharynx, described as aching, worse with swallowing, no alleviating factors identified.  He has tried TheraFlu with last dose yesterday.  This is provided minimal relief of symptoms.  Denies any known sick contacts but does work around many people.  He has had a fever with highest recorded 101.5 F.  He is able to eat and drink despite symptoms.  He is having difficulty with daily duties as a result of symptoms.  Denies any muffled voice, swelling of his throat, dysphagia.    Past Medical History:  Diagnosis Date   Anxiety    Asthma    Bradycardia    GERD (gastroesophageal reflux disease)    Seasonal allergies     Patient Active Problem List   Diagnosis Date Noted   Acute asthma exacerbation 07/23/2019   Emesis 07/23/2019   Asthma exacerbation 07/23/2019    Past Surgical History:  Procedure Laterality Date   DENTAL SURGERY         Home Medications    Prior to Admission medications   Medication Sig Start Date End Date Taking? Authorizing Provider  albuterol (VENTOLIN HFA) 108 (90 Base) MCG/ACT inhaler Inhale 2 puffs into the lungs every 6 (six) hours as needed for wheezing or shortness of breath. 05/28/21  Yes Meccariello, Solmon Ice, DO  omeprazole (PRILOSEC) 20 MG capsule Take 1 capsule (20 mg total) by mouth daily. 11/11/20  Yes Sabas Sous, MD  amoxicillin-clavulanate (AUGMENTIN) 875-125 MG tablet Take 1 tablet by mouth every 12 (twelve) hours. 01/05/22   Caprice Wasko, Noberto Retort, PA-C  EPINEPHrine 0.3 mg/0.3 mL IJ SOAJ injection Inject 0.3 mg into the muscle  as needed for anaphylaxis. 04/04/21   Tegeler, Canary Brim, MD  budesonide (PULMICORT) 0.25 MG/2ML nebulizer solution Take 2 mLs (0.25 mg total) by nebulization 2 (two) times daily. 07/26/19 12/09/19  Joseph Art, DO  escitalopram (LEXAPRO) 10 MG tablet Take 1 tablet (10 mg total) by mouth daily. 02/27/19 07/18/19  Dahlia Byes A, NP  fluticasone (FLOVENT HFA) 110 MCG/ACT inhaler Take 2 puffs twice daily for 1-2 weeks when in your yellow zone. 11/01/18 07/18/19  Sharlene Dory, DO  ipratropium-albuterol (DUONEB) 0.5-2.5 (3) MG/3ML SOLN Take 3 mLs by nebulization 4 (four) times daily for 7 days. Patient not taking: Reported on 11/08/2019 07/26/19 12/09/19  Joseph Art, DO  levocetirizine (XYZAL) 5 MG tablet Take 1 tablet (5 mg total) by mouth every evening. 11/01/18 02/27/19  Sharlene Dory, DO  pantoprazole (PROTONIX) 40 MG tablet Take 1 tablet (40 mg total) by mouth 2 (two) times daily for 7 days. Patient not taking: Reported on 11/08/2019 07/26/19 12/09/19  Joseph Art, DO  sucralfate (CARAFATE) 1 G tablet Take 1 tablet (1 g total) by mouth 4 (four) times daily. 06/08/11 10/20/11  Eber Hong, MD    Family History Family History  Problem Relation Age of Onset   Cancer Other    Hypertension Other    Clotting disorder Mother    Congestive Heart Failure Father  Kidney disease Father    Cancer Father     Social History Social History   Tobacco Use   Smoking status: Former    Packs/day: 1.00    Types: Cigarettes    Quit date: 12/05/2020    Years since quitting: 1.0   Smokeless tobacco: Never  Vaping Use   Vaping Use: Never used  Substance Use Topics   Alcohol use: Yes    Alcohol/week: 4.0 standard drinks of alcohol    Types: 4 Shots of liquor per week   Drug use: Never     Allergies   Shellfish-derived products and Orange concentrate [flavoring agent]   Review of Systems Review of Systems  Constitutional:  Positive for activity change, fatigue and  fever. Negative for appetite change.  HENT:  Positive for sore throat and trouble swallowing. Negative for congestion, sinus pressure and sneezing.   Respiratory:  Negative for cough and shortness of breath.   Cardiovascular:  Negative for chest pain.  Gastrointestinal:  Negative for abdominal pain, diarrhea, nausea and vomiting.  Neurological:  Negative for dizziness, light-headedness and headaches.     Physical Exam Triage Vital Signs ED Triage Vitals  Enc Vitals Group     BP 01/05/22 2008 122/74     Pulse Rate 01/05/22 2008 93     Resp 01/05/22 2008 20     Temp 01/05/22 2008 (!) 101 F (38.3 C)     Temp Source 01/05/22 2008 Oral     SpO2 01/05/22 2008 100 %     Weight --      Height --      Head Circumference --      Peak Flow --      Pain Score 01/05/22 2005 9     Pain Loc --      Pain Edu? --      Excl. in GC? --    No data found.  Updated Vital Signs BP 119/76   Pulse 73   Temp (!) 100.4 F (38 C) (Oral)   Resp 20   SpO2 100%   Visual Acuity Right Eye Distance:   Left Eye Distance:   Bilateral Distance:    Right Eye Near:   Left Eye Near:    Bilateral Near:     Physical Exam Vitals reviewed.  Constitutional:      General: He is awake.     Appearance: Normal appearance. He is well-developed. He is not ill-appearing.     Comments: Very pleasant male appears stated age in no acute distress sitting comfortably in exam room  HENT:     Head: Normocephalic and atraumatic.     Right Ear: Tympanic membrane, ear canal and external ear normal. Tympanic membrane is not erythematous or bulging.     Left Ear: Tympanic membrane, ear canal and external ear normal. Tympanic membrane is not erythematous or bulging.     Nose: Nose normal.     Mouth/Throat:     Pharynx: Uvula midline. Posterior oropharyngeal erythema present. No oropharyngeal exudate or uvula swelling.     Tonsils: Tonsillar exudate present. No tonsillar abscesses. 1+ on the right. 1+ on the left.   Cardiovascular:     Rate and Rhythm: Normal rate and regular rhythm.     Heart sounds: Normal heart sounds, S1 normal and S2 normal. No murmur heard. Pulmonary:     Effort: Pulmonary effort is normal. No accessory muscle usage or respiratory distress.     Breath sounds: Normal breath sounds. No stridor.  No wheezing, rhonchi or rales.     Comments: Clear to auscultation bilaterally Lymphadenopathy:     Head:     Right side of head: No submental, submandibular or tonsillar adenopathy.     Left side of head: No submental, submandibular or tonsillar adenopathy.     Cervical: No cervical adenopathy.  Neurological:     Mental Status: He is alert.  Psychiatric:        Behavior: Behavior is cooperative.      UC Treatments / Results  Labs (all labs ordered are listed, but only abnormal results are displayed) Labs Reviewed  CULTURE, GROUP A STREP [facility])  POCT RAPID STREP A, ED / UC  POCT INFECTIOUS MONO SCREEN, ED / UC    EKG   Radiology DG Chest 2 View  Result Date: 01/04/2022 CLINICAL DATA:  Shortness of breath and cough. EXAM: CHEST - 2 VIEW COMPARISON:  Chest radiograph dated 07/06/2021. FINDINGS: No focal consolidation, pleural effusion, pneumothorax. The cardiac silhouette is within limits. No acute osseous pathology. IMPRESSION: No active cardiopulmonary disease. Electronically Signed   By: Elgie CollardArash  Radparvar M.D.   On: 01/04/2022 00:49    Procedures Procedures (including critical care time)  Medications Ordered in UC Medications  ibuprofen (ADVIL) tablet 800 mg (800 mg Oral Given 01/05/22 2112)    Initial Impression / Assessment and Plan / UC Course  I have reviewed the triage vital signs and the nursing notes.  Pertinent labs & imaging results that were available during my care of the patient were reviewed by me and considered in my medical decision making (see chart for details).     Strep and mono were obtained during triage and were negative.  Review of chart  showed patient tested positive for strep in ER yesterday but did not stay to be seen.  We will start Augmentin twice daily for 10 days.  Recommended gargling with warm salt water and alternate Tylenol ibuprofen for pain and fever relief.  Patient was given ibuprofen 800 mg in clinic with improvement of fever.  Discussed that if he has any worsening symptoms including swelling of his throat, muffled voice, dysphagia, odynophagia that interferes with oral intake, nausea/vomiting he needs to go to the emergency room to which he expressed understanding.  Work excuse note was provided.  Final Clinical Impressions(s) / UC Diagnoses   Final diagnoses:  Acute tonsillitis, unspecified etiology  Sore throat  Fever, unspecified  Strep pharyngitis     Discharge Instructions      Your strep and mono were negative.  We are starting Augmentin to cover for an infection.  Take this twice daily for 7 days.  Gargle with warm salt water and use Tylenol ibuprofen for pain.  If your symptoms do not improving quickly with antibiotics please return for reevaluation.  If any point anything worsens and you have shortness of breath, change in your voice, difficulty swallowing especially your saliva, nausea/vomiting, fever not responding to medication you need to go to the emergency room immediately.     ED Prescriptions     Medication Sig Dispense Auth. Provider   amoxicillin-clavulanate (AUGMENTIN) 875-125 MG tablet Take 1 tablet by mouth every 12 (twelve) hours. 20 tablet Kadrian Partch, Noberto RetortErin K, PA-C      PDMP not reviewed this encounter.   Jeani HawkingRaspet, Deaaron Fulghum K, PA-C 01/05/22 2127

## 2022-01-05 NOTE — ED Triage Notes (Signed)
Pt presents with body aches, fever 101.5, sore throat x 1 week.

## 2022-01-08 LAB — CULTURE, GROUP A STREP (THRC)

## 2022-01-14 ENCOUNTER — Other Ambulatory Visit: Payer: Self-pay

## 2022-01-14 ENCOUNTER — Ambulatory Visit (HOSPITAL_COMMUNITY)
Admission: EM | Admit: 2022-01-14 | Discharge: 2022-01-14 | Disposition: A | Discharge status: Home / Self Care | Payer: Medicare Other | Attending: Emergency Medicine | Admitting: Emergency Medicine

## 2022-01-14 ENCOUNTER — Encounter (HOSPITAL_COMMUNITY): Payer: Self-pay | Admitting: Emergency Medicine

## 2022-01-14 DIAGNOSIS — R519 Headache, unspecified: Secondary | ICD-10-CM | POA: Diagnosis not present

## 2022-01-14 MED ORDER — IBUPROFEN 800 MG PO TABS
800.0000 mg | ORAL_TABLET | Freq: Once | ORAL | Status: AC
  Administered 2022-01-14: 800 mg via ORAL

## 2022-01-14 MED ORDER — CEFDINIR 300 MG PO CAPS: 300.0000 mg | ORAL_CAPSULE | Freq: Two times a day (BID) | ORAL | 0 refills | Status: AC

## 2022-01-14 MED ORDER — IBUPROFEN 800 MG PO TABS
ORAL_TABLET | ORAL | Status: AC
  Filled 2022-01-14: qty 1

## 2022-01-14 NOTE — Discharge Instructions (Signed)
Take ibuprofen every 6 hours for headache.  I have sent you in a new antibiotic to your pharmacy.  Please take this twice a day for a full 10 days.  If you feel your symptoms are worsening please go to the emergency department.

## 2022-01-14 NOTE — ED Provider Notes (Signed)
[facility]    CSN: [id number] Arrival date & time: 01/14/22  0809     History   Chief Complaint Chief Complaint  Patient presents with   Headache    HPI Kenneth Lane is a 36 y.o. male.  Presents with 4-day history of headache.  Feels his whole head is throbbing.  Worse with movement.  He tried 1 ibuprofen on his first day of symptoms.  Tried 1 Tylenol yesterday and 1 Tylenol this morning.  Some photosensitivity.  Reports congestion and nausea.  Not currently nauseous in clinic.  He denies any head injury or trauma, loss of consciousness, vision changes. No history of headaches or migraines.  Denies numbness, tingling, weakness.  No neck or back pain.  He was recently diagnosed with strep throat on 6/12 but only took 5 days of the medication.  Denies sore throat.  Past Medical History:  Diagnosis Date   Anxiety    Asthma    Bradycardia    GERD (gastroesophageal reflux disease)    Seasonal allergies     Patient Active Problem List   Diagnosis Date Noted   Acute asthma exacerbation 07/23/2019   Emesis 07/23/2019   Asthma exacerbation 07/23/2019    Past Surgical History:  Procedure Laterality Date   DENTAL SURGERY         Home Medications    Prior to Admission medications   Medication Sig Start Date End Date Taking? Authorizing Provider  cefdinir (OMNICEF) 300 MG capsule Take 1 capsule (300 mg total) by mouth 2 (two) times daily for 10 days. It is important you take this medication for the full 10 days. 01/14/22 01/24/22 Yes Somer Trotter, Lurena Joiner, PA-C  albuterol (VENTOLIN HFA) 108 (90 Base) MCG/ACT inhaler Inhale 2 puffs into the lungs every 6 (six) hours as needed for wheezing or shortness of breath. 05/28/21   Meccariello, Solmon Ice, DO  EPINEPHrine 0.3 mg/0.3 mL IJ SOAJ injection Inject 0.3 mg into the muscle as needed for anaphylaxis. 04/04/21   Tegeler, Canary Brim, MD  omeprazole (PRILOSEC) 20 MG capsule Take 1 capsule (20 mg total) by mouth daily.  11/11/20   Sabas Sous, MD  budesonide (PULMICORT) 0.25 MG/2ML nebulizer solution Take 2 mLs (0.25 mg total) by nebulization 2 (two) times daily. 07/26/19 12/09/19  Joseph Art, DO  escitalopram (LEXAPRO) 10 MG tablet Take 1 tablet (10 mg total) by mouth daily. 02/27/19 07/18/19  Dahlia Byes A, NP  fluticasone (FLOVENT HFA) 110 MCG/ACT inhaler Take 2 puffs twice daily for 1-2 weeks when in your yellow zone. 11/01/18 07/18/19  Sharlene Dory, DO  ipratropium-albuterol (DUONEB) 0.5-2.5 (3) MG/3ML SOLN Take 3 mLs by nebulization 4 (four) times daily for 7 days. Patient not taking: Reported on 11/08/2019 07/26/19 12/09/19  Joseph Art, DO  levocetirizine (XYZAL) 5 MG tablet Take 1 tablet (5 mg total) by mouth every evening. 11/01/18 02/27/19  Sharlene Dory, DO  pantoprazole (PROTONIX) 40 MG tablet Take 1 tablet (40 mg total) by mouth 2 (two) times daily for 7 days. Patient not taking: Reported on 11/08/2019 07/26/19 12/09/19  Joseph Art, DO  sucralfate (CARAFATE) 1 G tablet Take 1 tablet (1 g total) by mouth 4 (four) times daily. 06/08/11 10/20/11  Eber Hong, MD    Family History Family History  Problem Relation Age of Onset   Cancer Other    Hypertension Other    Clotting disorder Mother    Congestive Heart Failure Father    Kidney disease Father  Cancer Father     Social History Social History   Tobacco Use   Smoking status: Former    Packs/day: 1.00    Types: Cigarettes    Quit date: 12/05/2020    Years since quitting: 1.1   Smokeless tobacco: Never  Vaping Use   Vaping Use: Never used  Substance Use Topics   Alcohol use: Yes    Alcohol/week: 4.0 standard drinks of alcohol    Types: 4 Shots of liquor per week   Drug use: Never     Allergies   Shellfish-derived products and Orange concentrate [flavoring agent]   Review of Systems Review of Systems  Neurological:  Positive for headaches.   Per HPI  Physical Exam Triage Vital Signs ED  Triage Vitals  Enc Vitals Group     BP 01/14/22 0819 130/75     Pulse Rate 01/14/22 0819 73     Resp 01/14/22 0819 16     Temp 01/14/22 0819 98.7 F (37.1 C)     Temp Source 01/14/22 0819 Oral     SpO2 01/14/22 0819 99 %     Weight --      Height --      Head Circumference --      Peak Flow --      Pain Score 01/14/22 0823 9     Pain Loc --      Pain Edu? --      Excl. in GC? --    No data found.  Updated Vital Signs BP 130/75 (BP Location: Left Arm)   Pulse 73   Temp 98.7 F (37.1 C) (Oral)   Resp 16   SpO2 99%     Physical Exam Vitals and nursing note reviewed.  Constitutional:      General: He is not in acute distress. HENT:     Head: Normocephalic and atraumatic.     Right Ear: Tympanic membrane and ear canal normal.     Left Ear: Tympanic membrane and ear canal normal.     Nose: Nose normal.     Mouth/Throat:     Mouth: Mucous membranes are moist.     Pharynx: Oropharyngeal exudate and posterior oropharyngeal erythema present.  Eyes:     Extraocular Movements: Extraocular movements intact.     Conjunctiva/sclera: Conjunctivae normal.     Pupils: Pupils are equal, round, and reactive to light.  Cardiovascular:     Rate and Rhythm: Normal rate and regular rhythm.     Heart sounds: Normal heart sounds.  Pulmonary:     Effort: Pulmonary effort is normal. No respiratory distress.     Breath sounds: Normal breath sounds.  Abdominal:     Palpations: Abdomen is soft.     Tenderness: There is no abdominal tenderness.  Musculoskeletal:        General: Normal range of motion.     Cervical back: Normal range of motion.  Neurological:     Mental Status: He is alert and oriented to person, place, and time.     Cranial Nerves: No cranial nerve deficit or facial asymmetry.     Sensory: No sensory deficit.     Motor: No weakness.     Coordination: Coordination normal.     Gait: Gait normal.     Comments: Strength 5/5 all extremities, sensation intact, full ROM       UC Treatments / Results  Labs (all labs ordered are listed, but only abnormal results are displayed) Labs Reviewed - No  data to display  EKG  Radiology No results found.  Procedures Procedures (including critical care time)  Medications Ordered in UC Medications  ibuprofen (ADVIL) tablet 800 mg (800 mg Oral Given 01/14/22 0850)    Initial Impression / Assessment and Plan / UC Course  I have reviewed the triage vital signs and the nursing notes.  Pertinent labs & imaging results that were available during my care of the patient were reviewed by me and considered in my medical decision making (see chart for details).  Neurological exam is unremarkable. Dose of ibuprofen given in clinic.  Headache is almost completely resolved with medicine.  I recommend he continue ibuprofen every 6 hours to control his pain.  He does have a primary care provider that he can follow-up with. Discussed with patient that if strep throat is left untreated it can develop into any problems in the future.  I am going to restart him on Omnicef twice a day for 10 days.  We discussed return precautions and he understands to go to the emergency department if any of his symptoms worsens. Patient agrees to plan and is discharged in stable condition.  Final Clinical Impressions(s) / UC Diagnoses   Final diagnoses:  Acute nonintractable headache, unspecified headache type     Discharge Instructions      Take ibuprofen every 6 hours for headache.  I have sent you in a new antibiotic to your pharmacy.  Please take this twice a day for a full 10 days.  If you feel your symptoms are worsening please go to the emergency department.     ED Prescriptions     Medication Sig Dispense Auth. Provider   cefdinir (OMNICEF) 300 MG capsule Take 1 capsule (300 mg total) by mouth 2 (two) times daily for 10 days. It is important you take this medication for the full 10 days. 20 capsule Gradie Butrick, Lurena Joiner, PA-C       PDMP not reviewed this encounter.   Aivah Putman, Lurena Joiner, [place] 01/14/22 [phone]

## 2022-01-14 NOTE — ED Triage Notes (Addendum)
Patient c/o headache x 4 days.   Patient denies N/V. Patient denies fever or any cold like symptoms. Patient denies photosensitivity.   Patient endorses "throbbing" pain. Patient endorses worsening pain with movement.   Patient took Tylenol and Advil with no relief of symptoms.

## 2022-03-04 ENCOUNTER — Ambulatory Visit (INDEPENDENT_AMBULATORY_CARE_PROVIDER_SITE_OTHER): Payer: Medicare Other

## 2022-03-04 ENCOUNTER — Ambulatory Visit (INDEPENDENT_AMBULATORY_CARE_PROVIDER_SITE_OTHER): Payer: Medicare Other | Admitting: Physician Assistant

## 2022-03-04 ENCOUNTER — Ambulatory Visit (HOSPITAL_BASED_OUTPATIENT_CLINIC_OR_DEPARTMENT_OTHER): Payer: Medicare Other | Admitting: Orthopaedic Surgery

## 2022-03-04 DIAGNOSIS — M25562 Pain in left knee: Secondary | ICD-10-CM | POA: Diagnosis not present

## 2022-03-04 MED ORDER — MELOXICAM 15 MG PO TABS: 15.0000 mg | ORAL_TABLET | Freq: Every day | ORAL | 0 refills | Status: AC

## 2022-03-04 NOTE — Progress Notes (Signed)
Office Visit Note   Patient: Kenneth Lane           Date of Birth: 1985/09/20           MRN: [id number] Visit Date: 03/04/2022              Requested by: No referring provider defined for this encounter. PCP: Pcp, No  Chief Complaint  Patient presents with   Left Knee - Pain      HPI: Patient is a pleasant 36 year old gentleman who is 3 to 4 weeks history of anterior left knee pain.  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  He was seen and evaluated for this by another provider and given some medication.  He still continues to have knee pain that makes it difficult to climb up and down difficult to walk far.  He denies any previous significant issues with his knee  Assessment & Plan: Visit Diagnoses:  1. Acute pain of left knee     Plan: Findings consistent with a traumatic left patellar tendinitis or contusion.  His knee is stable he has no effusion today.  I have recommended physical therapy as well as a course of Mobic.  He has taken ibuprofen before and tolerated it fine.  He will follow-up in 1 month with Dr. Steward Drone with whom he was supposed to have an appointment today.  Follow-Up Instructions: No follow-ups on file.   Ortho Exam  Patient is alert, oriented, no adenopathy, well-dressed, normal affect, normal respiratory effort. Examination of his knee he has no effusion no redness no sign of cellulitis.  He has no crepitus with range of motion.  He has good varus valgus stability.  Good endpoint on anterior draw.  No tenderness over the medial or lateral joint line.  He does have some tenderness over the patella tendon and the patella tendon distal insertion.  He is able to sustain a straight leg raise without difficulty good leg strength with resisted extension  Imaging: No results found. No images are attached to the encounter.  Labs: Lab Results  Component Value Date   REPTSTATUS 01/08/2022 FINAL 01/05/2022   CULT  01/05/2022     NO GROUP A STREP (S.PYOGENES) ISOLATED Performed at [facility], [address], [place], [place] [address]      Lab Results  Component Value Date   ALBUMIN 4.1 11/11/2020   ALBUMIN 4.0 01/23/2020   ALBUMIN 4.1 07/23/2019    Lab Results  Component Value Date   MG 2.2 07/24/2019   No results found for: "VD25OH"  No results found for: "PREALBUMIN"    Latest Ref Rng & Units 07/06/2021    6:52 PM 06/08/2021    7:01 AM 02/01/2021   10:46 PM  CBC EXTENDED  WBC 4.0 - 10.5 K/uL 6.8  6.4  6.4   RBC 4.22 - 5.81 MIL/uL 5.11  5.07  5.30   Hemoglobin 13.0 - 17.0 g/dL 50.9  32.6  71.2   HCT 39.0 - 52.0 % 44.5  43.4  45.1   Platelets 150 - 400 K/uL 260  253  239   NEUT# 1.7 - 7.7 K/uL 3.2     Lymph# 0.7 - 4.0 K/uL 2.4        There is no height or weight on file to calculate BMI.  Orders:  Orders Placed This Encounter  Procedures   XR KNEE 3 VIEW LEFT   Ambulatory referral  to Physical Therapy   Meds ordered this encounter  Medications   meloxicam (MOBIC) 15 MG tablet    Sig: Take 1 tablet (15 mg total) by mouth daily.    Dispense:  30 tablet    Refill:  0     Procedures: No procedures performed  Clinical Data: No additional findings.  ROS:  All other systems negative, except as noted in the HPI. Review of Systems  Objective: Vital Signs: There were no vitals taken for this visit.  Specialty Comments:  No specialty comments available.  PMFS History: Patient Active Problem List   Diagnosis Date Noted   Acute asthma exacerbation 07/23/2019   Emesis 07/23/2019   Asthma exacerbation 07/23/2019   Past Medical History:  Diagnosis Date   Anxiety    Asthma    Bradycardia    GERD (gastroesophageal reflux disease)    Seasonal allergies     Family History  Problem Relation Age of Onset   Cancer Other    Hypertension Other    Clotting disorder Mother    Congestive Heart Failure Father    Kidney disease Father    Cancer Father     Past Surgical  History:  Procedure Laterality Date   DENTAL SURGERY     Social History   Occupational History   Not on file  Tobacco Use   Smoking status: Former    Packs/day: 1.00    Types: Cigarettes    Quit date: 12/05/2020    Years since quitting: 1.2   Smokeless tobacco: Never  Vaping Use   Vaping Use: Never used  Substance and Sexual Activity   Alcohol use: Yes    Alcohol/week: 4.0 standard drinks of alcohol    Types: 4 Shots of liquor per week   Drug use: Never   Sexual activity: Not on file

## 2022-04-23 ENCOUNTER — Ambulatory Visit (HOSPITAL_BASED_OUTPATIENT_CLINIC_OR_DEPARTMENT_OTHER): Payer: Medicare Other | Attending: Physician Assistant | Admitting: Physical Therapy
